# Patient Record
Sex: Male | Born: 1965 | Race: White | Hispanic: No | Smoking: Never smoker
Health system: Southern US, Community
[De-identification: ages and names within clinical notes are randomized; demographics above are authoritative.]

## PROBLEM LIST (undated history)

## (undated) DIAGNOSIS — I10 Essential (primary) hypertension: Secondary | ICD-10-CM

## (undated) DIAGNOSIS — Z5189 Encounter for other specified aftercare: Secondary | ICD-10-CM

## (undated) DIAGNOSIS — K589 Irritable bowel syndrome without diarrhea: Secondary | ICD-10-CM

## (undated) DIAGNOSIS — K2970 Gastritis, unspecified, without bleeding: Secondary | ICD-10-CM

## (undated) DIAGNOSIS — K5792 Diverticulitis of intestine, part unspecified, without perforation or abscess without bleeding: Secondary | ICD-10-CM

## (undated) DIAGNOSIS — T7840XA Allergy, unspecified, initial encounter: Secondary | ICD-10-CM

## (undated) DIAGNOSIS — K5730 Diverticulosis of large intestine without perforation or abscess without bleeding: Secondary | ICD-10-CM

## (undated) DIAGNOSIS — J069 Acute upper respiratory infection, unspecified: Secondary | ICD-10-CM

## (undated) DIAGNOSIS — Z9889 Other specified postprocedural states: Secondary | ICD-10-CM

## (undated) DIAGNOSIS — F329 Major depressive disorder, single episode, unspecified: Secondary | ICD-10-CM

## (undated) DIAGNOSIS — F411 Generalized anxiety disorder: Secondary | ICD-10-CM

## (undated) DIAGNOSIS — K279 Peptic ulcer, site unspecified, unspecified as acute or chronic, without hemorrhage or perforation: Secondary | ICD-10-CM

## (undated) DIAGNOSIS — D649 Anemia, unspecified: Secondary | ICD-10-CM

## (undated) DIAGNOSIS — K469 Unspecified abdominal hernia without obstruction or gangrene: Secondary | ICD-10-CM

## (undated) DIAGNOSIS — J189 Pneumonia, unspecified organism: Secondary | ICD-10-CM

## (undated) DIAGNOSIS — F32A Depression, unspecified: Secondary | ICD-10-CM

## (undated) DIAGNOSIS — R112 Nausea with vomiting, unspecified: Secondary | ICD-10-CM

## (undated) DIAGNOSIS — K219 Gastro-esophageal reflux disease without esophagitis: Secondary | ICD-10-CM

## (undated) DIAGNOSIS — K2990 Gastroduodenitis, unspecified, without bleeding: Secondary | ICD-10-CM

## (undated) DIAGNOSIS — M459 Ankylosing spondylitis of unspecified sites in spine: Secondary | ICD-10-CM

## (undated) DIAGNOSIS — K449 Diaphragmatic hernia without obstruction or gangrene: Secondary | ICD-10-CM

## (undated) DIAGNOSIS — N289 Disorder of kidney and ureter, unspecified: Secondary | ICD-10-CM

## (undated) DIAGNOSIS — E785 Hyperlipidemia, unspecified: Secondary | ICD-10-CM

## (undated) DIAGNOSIS — Z800 Family history of malignant neoplasm of digestive organs: Secondary | ICD-10-CM

## (undated) DIAGNOSIS — M1990 Unspecified osteoarthritis, unspecified site: Secondary | ICD-10-CM

## (undated) HISTORY — PX: COLON SURGERY: SHX602

## (undated) HISTORY — DX: Diaphragmatic hernia without obstruction or gangrene: K44.9

## (undated) HISTORY — DX: Encounter for other specified aftercare: Z51.89

## (undated) HISTORY — DX: Gastritis, unspecified, without bleeding: K29.70

## (undated) HISTORY — DX: Depression, unspecified: F32.A

## (undated) HISTORY — DX: Diverticulosis of large intestine without perforation or abscess without bleeding: K57.30

## (undated) HISTORY — PX: BACK SURGERY: SHX140

## (undated) HISTORY — DX: Generalized anxiety disorder: F41.1

## (undated) HISTORY — DX: Hyperlipidemia, unspecified: E78.5

## (undated) HISTORY — DX: Allergy, unspecified, initial encounter: T78.40XA

## (undated) HISTORY — PX: SPINE SURGERY: SHX786

## (undated) HISTORY — DX: Essential (primary) hypertension: I10

## (undated) HISTORY — DX: Irritable bowel syndrome, unspecified: K58.9

## (undated) HISTORY — DX: Major depressive disorder, single episode, unspecified: F32.9

## (undated) HISTORY — DX: Gastroduodenitis, unspecified, without bleeding: K29.90

## (undated) HISTORY — DX: Diverticulitis of intestine, part unspecified, without perforation or abscess without bleeding: K57.92

## (undated) HISTORY — DX: Family history of malignant neoplasm of digestive organs: Z80.0

## (undated) HISTORY — DX: Unspecified abdominal hernia without obstruction or gangrene: K46.9

## (undated) HISTORY — DX: Gastro-esophageal reflux disease without esophagitis: K21.9

## (undated) HISTORY — DX: Anemia, unspecified: D64.9

## (undated) HISTORY — DX: Unspecified osteoarthritis, unspecified site: M19.90

## (undated) HISTORY — DX: Peptic ulcer, site unspecified, unspecified as acute or chronic, without hemorrhage or perforation: K27.9

---

## 1998-01-28 ENCOUNTER — Inpatient Hospital Stay (HOSPITAL_COMMUNITY): Admission: EM | Admit: 1998-01-28 | Discharge: 1998-02-01 | Payer: Self-pay | Admitting: Emergency Medicine

## 1998-09-30 HISTORY — PX: APPENDECTOMY: SHX54

## 1999-01-20 ENCOUNTER — Encounter: Payer: Self-pay | Admitting: Emergency Medicine

## 1999-01-20 ENCOUNTER — Emergency Department (HOSPITAL_COMMUNITY): Admission: EM | Admit: 1999-01-20 | Discharge: 1999-01-20 | Payer: Self-pay | Admitting: Emergency Medicine

## 1999-08-12 ENCOUNTER — Encounter: Payer: Self-pay | Admitting: Rheumatology

## 1999-08-12 ENCOUNTER — Ambulatory Visit (HOSPITAL_COMMUNITY): Admission: RE | Admit: 1999-08-12 | Discharge: 1999-08-12 | Payer: Self-pay | Admitting: Rheumatology

## 1999-09-20 ENCOUNTER — Encounter: Payer: Self-pay | Admitting: Neurosurgery

## 1999-09-20 ENCOUNTER — Observation Stay (HOSPITAL_COMMUNITY): Admission: RE | Admit: 1999-09-20 | Discharge: 1999-09-20 | Payer: Self-pay | Admitting: Neurosurgery

## 1999-11-28 ENCOUNTER — Ambulatory Visit (HOSPITAL_COMMUNITY): Admission: RE | Admit: 1999-11-28 | Discharge: 1999-11-28 | Payer: Self-pay | Admitting: Neurosurgery

## 1999-11-28 ENCOUNTER — Encounter: Payer: Self-pay | Admitting: Neurosurgery

## 2000-03-19 ENCOUNTER — Encounter: Admission: RE | Admit: 2000-03-19 | Discharge: 2000-06-17 | Payer: Self-pay | Admitting: Anesthesiology

## 2000-04-15 ENCOUNTER — Ambulatory Visit (HOSPITAL_COMMUNITY): Admission: RE | Admit: 2000-04-15 | Discharge: 2000-04-15 | Payer: Self-pay | Admitting: Endocrinology

## 2000-04-15 ENCOUNTER — Encounter: Payer: Self-pay | Admitting: Endocrinology

## 2000-05-19 ENCOUNTER — Encounter: Payer: Self-pay | Admitting: Neurosurgery

## 2000-05-19 ENCOUNTER — Ambulatory Visit (HOSPITAL_COMMUNITY): Admission: RE | Admit: 2000-05-19 | Discharge: 2000-05-19 | Payer: Self-pay | Admitting: Neurosurgery

## 2000-06-09 ENCOUNTER — Inpatient Hospital Stay (HOSPITAL_COMMUNITY): Admission: RE | Admit: 2000-06-09 | Discharge: 2000-06-11 | Payer: Self-pay | Admitting: Neurosurgery

## 2000-06-09 ENCOUNTER — Encounter: Payer: Self-pay | Admitting: Neurosurgery

## 2000-07-15 ENCOUNTER — Ambulatory Visit (HOSPITAL_COMMUNITY): Admission: RE | Admit: 2000-07-15 | Discharge: 2000-07-15 | Payer: Self-pay | Admitting: Neurosurgery

## 2000-07-15 ENCOUNTER — Encounter: Payer: Self-pay | Admitting: Neurosurgery

## 2000-09-03 ENCOUNTER — Ambulatory Visit (HOSPITAL_COMMUNITY): Admission: RE | Admit: 2000-09-03 | Discharge: 2000-09-03 | Payer: Self-pay | Admitting: Neurosurgery

## 2000-09-03 ENCOUNTER — Encounter: Payer: Self-pay | Admitting: Neurosurgery

## 2001-01-22 ENCOUNTER — Encounter (INDEPENDENT_AMBULATORY_CARE_PROVIDER_SITE_OTHER): Payer: Self-pay | Admitting: Gastroenterology

## 2001-01-22 DIAGNOSIS — K2970 Gastritis, unspecified, without bleeding: Secondary | ICD-10-CM | POA: Insufficient documentation

## 2001-01-22 DIAGNOSIS — K2990 Gastroduodenitis, unspecified, without bleeding: Secondary | ICD-10-CM

## 2001-01-22 DIAGNOSIS — K449 Diaphragmatic hernia without obstruction or gangrene: Secondary | ICD-10-CM | POA: Insufficient documentation

## 2001-01-23 ENCOUNTER — Encounter (INDEPENDENT_AMBULATORY_CARE_PROVIDER_SITE_OTHER): Payer: Self-pay | Admitting: Specialist

## 2001-01-23 ENCOUNTER — Other Ambulatory Visit: Admission: RE | Admit: 2001-01-23 | Discharge: 2001-01-23 | Payer: Self-pay | Admitting: Gastroenterology

## 2001-05-18 ENCOUNTER — Emergency Department (HOSPITAL_COMMUNITY): Admission: EM | Admit: 2001-05-18 | Discharge: 2001-05-18 | Payer: Self-pay | Admitting: Emergency Medicine

## 2001-05-18 ENCOUNTER — Encounter: Payer: Self-pay | Admitting: Emergency Medicine

## 2001-06-06 ENCOUNTER — Encounter: Payer: Self-pay | Admitting: Neurosurgery

## 2001-06-06 ENCOUNTER — Ambulatory Visit (HOSPITAL_COMMUNITY): Admission: RE | Admit: 2001-06-06 | Discharge: 2001-06-06 | Payer: Self-pay | Admitting: Neurosurgery

## 2003-04-26 ENCOUNTER — Encounter: Admission: RE | Admit: 2003-04-26 | Discharge: 2003-04-26 | Payer: Self-pay | Admitting: Internal Medicine

## 2003-06-14 ENCOUNTER — Encounter: Payer: Self-pay | Admitting: Orthopaedic Surgery

## 2003-06-16 ENCOUNTER — Ambulatory Visit (HOSPITAL_COMMUNITY): Admission: RE | Admit: 2003-06-16 | Discharge: 2003-06-17 | Payer: Self-pay | Admitting: Orthopaedic Surgery

## 2003-06-16 ENCOUNTER — Encounter: Payer: Self-pay | Admitting: Orthopaedic Surgery

## 2004-02-21 ENCOUNTER — Encounter: Admission: RE | Admit: 2004-02-21 | Discharge: 2004-02-21 | Payer: Self-pay | Admitting: Orthopaedic Surgery

## 2004-04-18 ENCOUNTER — Inpatient Hospital Stay (HOSPITAL_COMMUNITY): Admission: RE | Admit: 2004-04-18 | Discharge: 2004-04-21 | Payer: Self-pay | Admitting: Orthopaedic Surgery

## 2004-06-22 ENCOUNTER — Emergency Department (HOSPITAL_COMMUNITY): Admission: EM | Admit: 2004-06-22 | Discharge: 2004-06-23 | Payer: Self-pay | Admitting: Emergency Medicine

## 2004-09-20 ENCOUNTER — Encounter: Admission: RE | Admit: 2004-09-20 | Discharge: 2004-09-20 | Payer: Self-pay | Admitting: Orthopaedic Surgery

## 2005-09-30 HISTORY — PX: CERVICAL FUSION: SHX112

## 2007-02-04 ENCOUNTER — Ambulatory Visit: Payer: Self-pay | Admitting: Gastroenterology

## 2007-04-26 ENCOUNTER — Encounter: Admission: RE | Admit: 2007-04-26 | Discharge: 2007-04-26 | Payer: Self-pay | Admitting: Orthopaedic Surgery

## 2007-04-28 ENCOUNTER — Encounter: Admission: RE | Admit: 2007-04-28 | Discharge: 2007-04-28 | Payer: Self-pay | Admitting: Orthopaedic Surgery

## 2007-06-11 ENCOUNTER — Ambulatory Visit: Payer: Self-pay | Admitting: Family Medicine

## 2007-06-11 ENCOUNTER — Inpatient Hospital Stay (HOSPITAL_COMMUNITY): Admission: AD | Admit: 2007-06-11 | Discharge: 2007-06-16 | Payer: Self-pay | Admitting: Family Medicine

## 2007-06-23 ENCOUNTER — Ambulatory Visit: Payer: Self-pay | Admitting: Gastroenterology

## 2007-06-23 LAB — CONVERTED CEMR LAB
ALT: 31 units/L (ref 0–53)
AST: 33 units/L (ref 0–37)
Albumin: 4.1 g/dL (ref 3.5–5.2)
Alkaline Phosphatase: 78 units/L (ref 39–117)
BUN: 22 mg/dL (ref 6–23)
Basophils Absolute: 0 10*3/uL (ref 0.0–0.1)
Basophils Relative: 0.4 % (ref 0.0–1.0)
Bilirubin, Direct: 0.1 mg/dL (ref 0.0–0.3)
CO2: 30 meq/L (ref 19–32)
Calcium: 9.5 mg/dL (ref 8.4–10.5)
Chloride: 110 meq/L (ref 96–112)
Creatinine, Ser: 1.2 mg/dL (ref 0.4–1.5)
Eosinophils Absolute: 0.1 10*3/uL (ref 0.0–0.6)
Eosinophils Relative: 1.7 % (ref 0.0–5.0)
Ferritin: 62.5 ng/mL (ref 22.0–322.0)
Folate: 6.5 ng/mL
GFR calc Af Amer: 86 mL/min
GFR calc non Af Amer: 71 mL/min
Glucose, Bld: 95 mg/dL (ref 70–99)
HCT: 38.4 % — ABNORMAL LOW (ref 39.0–52.0)
Hemoglobin: 13.5 g/dL (ref 13.0–17.0)
Iron: 107 ug/dL (ref 42–165)
Lymphocytes Relative: 28.2 % (ref 12.0–46.0)
MCHC: 35.2 g/dL (ref 30.0–36.0)
MCV: 85.3 fL (ref 78.0–100.0)
Monocytes Absolute: 0.9 10*3/uL — ABNORMAL HIGH (ref 0.2–0.7)
Monocytes Relative: 10.8 % (ref 3.0–11.0)
Neutro Abs: 5.2 10*3/uL (ref 1.4–7.7)
Neutrophils Relative %: 58.9 % (ref 43.0–77.0)
Platelets: 308 10*3/uL (ref 150–400)
Potassium: 4.2 meq/L (ref 3.5–5.1)
RBC: 4.5 M/uL (ref 4.22–5.81)
RDW: 12.3 % (ref 11.5–14.6)
Saturation Ratios: 31.3 % (ref 20.0–50.0)
Sed Rate: 22 mm/hr — ABNORMAL HIGH (ref 0–20)
Sodium: 146 meq/L — ABNORMAL HIGH (ref 135–145)
TSH: 1.04 microintl units/mL (ref 0.35–5.50)
Tissue Transglutaminase Ab, IgA: 0.4 units (ref <7)
Total Bilirubin: 0.6 mg/dL (ref 0.3–1.2)
Total Protein: 7.2 g/dL (ref 6.0–8.3)
Transferrin: 244.4 mg/dL (ref >=212.0)
Vitamin B-12: 375 pg/mL (ref 211–911)
WBC: 8.6 10*3/uL (ref 4.5–10.5)

## 2007-07-06 ENCOUNTER — Ambulatory Visit: Payer: Self-pay | Admitting: Gastroenterology

## 2007-07-06 ENCOUNTER — Encounter: Payer: Self-pay | Admitting: Gastroenterology

## 2007-07-06 DIAGNOSIS — K5730 Diverticulosis of large intestine without perforation or abscess without bleeding: Secondary | ICD-10-CM | POA: Insufficient documentation

## 2007-07-21 ENCOUNTER — Ambulatory Visit: Payer: Self-pay | Admitting: Cardiology

## 2007-08-10 ENCOUNTER — Ambulatory Visit: Payer: Self-pay

## 2007-08-10 ENCOUNTER — Encounter: Payer: Self-pay | Admitting: Cardiology

## 2007-08-10 ENCOUNTER — Ambulatory Visit: Payer: Self-pay | Admitting: Gastroenterology

## 2007-09-01 ENCOUNTER — Ambulatory Visit: Payer: Self-pay | Admitting: Cardiology

## 2007-11-20 DIAGNOSIS — M549 Dorsalgia, unspecified: Secondary | ICD-10-CM | POA: Insufficient documentation

## 2007-11-20 DIAGNOSIS — R519 Headache, unspecified: Secondary | ICD-10-CM | POA: Insufficient documentation

## 2007-11-20 DIAGNOSIS — K219 Gastro-esophageal reflux disease without esophagitis: Secondary | ICD-10-CM

## 2007-11-20 DIAGNOSIS — I10 Essential (primary) hypertension: Secondary | ICD-10-CM | POA: Insufficient documentation

## 2007-11-20 DIAGNOSIS — K589 Irritable bowel syndrome without diarrhea: Secondary | ICD-10-CM

## 2007-11-20 DIAGNOSIS — F411 Generalized anxiety disorder: Secondary | ICD-10-CM | POA: Insufficient documentation

## 2007-11-20 DIAGNOSIS — E785 Hyperlipidemia, unspecified: Secondary | ICD-10-CM

## 2007-11-20 DIAGNOSIS — R002 Palpitations: Secondary | ICD-10-CM

## 2007-11-20 DIAGNOSIS — R51 Headache: Secondary | ICD-10-CM

## 2008-07-22 ENCOUNTER — Ambulatory Visit: Payer: Self-pay | Admitting: Gastroenterology

## 2008-07-22 DIAGNOSIS — K5900 Constipation, unspecified: Secondary | ICD-10-CM | POA: Insufficient documentation

## 2008-07-22 DIAGNOSIS — K625 Hemorrhage of anus and rectum: Secondary | ICD-10-CM | POA: Insufficient documentation

## 2008-07-22 DIAGNOSIS — R109 Unspecified abdominal pain: Secondary | ICD-10-CM | POA: Insufficient documentation

## 2008-07-22 LAB — CONVERTED CEMR LAB
Eosinophils Relative: 2.3 % (ref 0.0–5.0)
Magnesium: 2.3 mg/dL (ref 1.5–2.5)
Monocytes Relative: 9.2 % (ref 3.0–12.0)
Neutrophils Relative %: 55.5 % (ref 43.0–77.0)
Platelets: 221 10*3/uL (ref 150–400)
Saturation Ratios: 13.3 % — ABNORMAL LOW (ref 20.0–50.0)
Sed Rate: 11 mm/hr (ref 0–16)
TSH: 0.82 microintl units/mL (ref 0.35–5.50)
Transferrin: 300.3 mg/dL (ref >=212.0)
WBC: 6.4 10*3/uL (ref 4.5–10.5)

## 2008-08-18 ENCOUNTER — Ambulatory Visit: Payer: Self-pay | Admitting: Gastroenterology

## 2010-11-29 DIAGNOSIS — IMO0001 Reserved for inherently not codable concepts without codable children: Secondary | ICD-10-CM

## 2010-11-29 DIAGNOSIS — Z5189 Encounter for other specified aftercare: Secondary | ICD-10-CM

## 2010-11-29 HISTORY — DX: Reserved for inherently not codable concepts without codable children: IMO0001

## 2010-11-29 HISTORY — PX: STOMACH SURGERY: SHX791

## 2010-11-29 HISTORY — DX: Encounter for other specified aftercare: Z51.89

## 2010-12-08 ENCOUNTER — Inpatient Hospital Stay (HOSPITAL_COMMUNITY)
Admission: EM | Admit: 2010-12-08 | Discharge: 2010-12-12 | DRG: 378 | Disposition: A | Discharge status: Home / Self Care | Payer: Managed Care, Other (non HMO) | Attending: Internal Medicine | Admitting: Internal Medicine

## 2010-12-08 ENCOUNTER — Emergency Department (HOSPITAL_COMMUNITY): Payer: Managed Care, Other (non HMO)

## 2010-12-08 ENCOUNTER — Other Ambulatory Visit: Payer: Self-pay | Admitting: Emergency Medicine

## 2010-12-08 DIAGNOSIS — E785 Hyperlipidemia, unspecified: Secondary | ICD-10-CM | POA: Diagnosis present

## 2010-12-08 DIAGNOSIS — K449 Diaphragmatic hernia without obstruction or gangrene: Secondary | ICD-10-CM | POA: Diagnosis present

## 2010-12-08 DIAGNOSIS — K264 Chronic or unspecified duodenal ulcer with hemorrhage: Principal | ICD-10-CM | POA: Diagnosis present

## 2010-12-08 DIAGNOSIS — R55 Syncope and collapse: Secondary | ICD-10-CM | POA: Diagnosis not present

## 2010-12-08 DIAGNOSIS — K2970 Gastritis, unspecified, without bleeding: Secondary | ICD-10-CM | POA: Diagnosis present

## 2010-12-08 DIAGNOSIS — K5730 Diverticulosis of large intestine without perforation or abscess without bleeding: Secondary | ICD-10-CM | POA: Diagnosis present

## 2010-12-08 DIAGNOSIS — Z981 Arthrodesis status: Secondary | ICD-10-CM

## 2010-12-08 DIAGNOSIS — K2990 Gastroduodenitis, unspecified, without bleeding: Secondary | ICD-10-CM | POA: Diagnosis present

## 2010-12-08 DIAGNOSIS — D62 Acute posthemorrhagic anemia: Secondary | ICD-10-CM | POA: Diagnosis present

## 2010-12-08 DIAGNOSIS — D649 Anemia, unspecified: Secondary | ICD-10-CM

## 2010-12-08 DIAGNOSIS — K2980 Duodenitis without bleeding: Secondary | ICD-10-CM | POA: Diagnosis present

## 2010-12-08 LAB — COMPREHENSIVE METABOLIC PANEL WITH GFR
ALT: 15 U/L (ref 0–53)
BUN: 26 mg/dL — ABNORMAL HIGH (ref 6–23)
Calcium: 9.1 mg/dL (ref 8.4–10.5)
Creatinine, Ser: 1.2 mg/dL (ref 0.4–1.5)
Glucose, Bld: 92 mg/dL (ref 70–99)
Sodium: 140 meq/L (ref 135–145)
Total Protein: 6.4 g/dL (ref 6.0–8.3)

## 2010-12-08 LAB — CBC
HCT: 32.5 % — ABNORMAL LOW (ref 39.0–52.0)
Hemoglobin: 11.1 g/dL — ABNORMAL LOW (ref 13.0–17.0)
MCH: 29.2 pg (ref 26.0–34.0)
MCHC: 34.2 g/dL (ref 30.0–36.0)
MCV: 85.5 fL (ref 78.0–100.0)
Platelets: 213 K/uL (ref 150–400)
RBC: 3.8 MIL/uL — ABNORMAL LOW (ref 4.22–5.81)
RDW: 12.6 % (ref 11.5–15.5)
WBC: 13.8 K/uL — ABNORMAL HIGH (ref 4.0–10.5)

## 2010-12-08 LAB — DIFFERENTIAL
Basophils Absolute: 0.1 10*3/uL (ref 0.0–0.1)
Basophils Relative: 0 % (ref 0–1)
Eosinophils Absolute: 0 K/uL (ref 0.0–0.7)
Eosinophils Relative: 0 % (ref 0–5)
Lymphocytes Relative: 26 % (ref 12–46)
Lymphs Abs: 3.5 K/uL (ref 0.7–4.0)
Monocytes Absolute: 1.2 10*3/uL — ABNORMAL HIGH (ref 0.1–1.0)
Monocytes Relative: 9 % (ref 3–12)
Neutro Abs: 9 10*3/uL — ABNORMAL HIGH (ref 1.7–7.7)
Neutrophils Relative %: 65 % (ref 43–77)

## 2010-12-08 LAB — HEMOGLOBIN AND HEMATOCRIT, BLOOD
HCT: 28.4 % — ABNORMAL LOW (ref 39.0–52.0)
Hemoglobin: 9.8 g/dL — ABNORMAL LOW (ref 13.0–17.0)

## 2010-12-08 LAB — COMPREHENSIVE METABOLIC PANEL
AST: 23 U/L (ref 0–37)
Albumin: 3.8 g/dL (ref 3.5–5.2)
Alkaline Phosphatase: 53 U/L (ref 39–117)
CO2: 24 mEq/L (ref 19–32)
Chloride: 108 mEq/L (ref 96–112)
GFR calc Af Amer: >60 mL/min (ref >60)
GFR calc non Af Amer: >60 mL/min (ref >60)
Potassium: 3.5 mEq/L (ref 3.5–5.1)
Total Bilirubin: 0.4 mg/dL (ref 0.3–1.2)

## 2010-12-08 LAB — ABO/RH: ABO/RH(D): O NEG

## 2010-12-08 LAB — APTT: aPTT: 31 seconds (ref 24–37)

## 2010-12-08 LAB — PREPARE RBC (CROSSMATCH)

## 2010-12-08 LAB — LIPASE, BLOOD: Lipase: 45 U/L (ref 11–59)

## 2010-12-08 MED ORDER — IOHEXOL 300 MG/ML  SOLN
125.0000 mL | Freq: Once | INTRAMUSCULAR | Status: AC | PRN
  Administered 2010-12-08: 125 mL via INTRAVENOUS

## 2010-12-09 ENCOUNTER — Other Ambulatory Visit: Payer: Self-pay | Admitting: Internal Medicine

## 2010-12-09 DIAGNOSIS — K921 Melena: Secondary | ICD-10-CM

## 2010-12-09 DIAGNOSIS — K264 Chronic or unspecified duodenal ulcer with hemorrhage: Secondary | ICD-10-CM

## 2010-12-09 DIAGNOSIS — D62 Acute posthemorrhagic anemia: Secondary | ICD-10-CM

## 2010-12-09 DIAGNOSIS — K2980 Duodenitis without bleeding: Secondary | ICD-10-CM

## 2010-12-09 LAB — BASIC METABOLIC PANEL
Calcium: 8.4 mg/dL (ref 8.4–10.5)
Creatinine, Ser: 1.29 mg/dL (ref 0.4–1.5)
GFR calc Af Amer: >60 mL/min (ref >60)
GFR calc non Af Amer: >60 mL/min (ref >60)
Sodium: 138 mEq/L (ref 135–145)

## 2010-12-09 LAB — HEMOGLOBIN AND HEMATOCRIT, BLOOD
HCT: 26.7 % — ABNORMAL LOW (ref 39.0–52.0)
HCT: 25.8 % — ABNORMAL LOW (ref 39.0–52.0)
HCT: 25.2 % — ABNORMAL LOW (ref 39.0–52.0)
Hemoglobin: 9.1 g/dL — ABNORMAL LOW (ref 13.0–17.0)
Hemoglobin: 8.5 g/dL — ABNORMAL LOW (ref 13.0–17.0)

## 2010-12-09 LAB — CBC
MCH: 29.4 pg (ref 26.0–34.0)
Platelets: 184 10*3/uL (ref 150–400)
RBC: 3.16 MIL/uL — ABNORMAL LOW (ref 4.22–5.81)
WBC: 6.7 10*3/uL (ref 4.0–10.5)

## 2010-12-10 DIAGNOSIS — D62 Acute posthemorrhagic anemia: Secondary | ICD-10-CM

## 2010-12-10 DIAGNOSIS — K2981 Duodenitis with bleeding: Secondary | ICD-10-CM

## 2010-12-10 DIAGNOSIS — R1013 Epigastric pain: Secondary | ICD-10-CM

## 2010-12-10 LAB — CBC
HCT: 24.6 % — ABNORMAL LOW (ref 39.0–52.0)
HCT: 31.3 % — ABNORMAL LOW (ref 39.0–52.0)
Hemoglobin: 10.2 g/dL — ABNORMAL LOW (ref 13.0–17.0)
MCH: 28.3 pg (ref 26.0–34.0)
MCHC: 33.3 g/dL (ref 30.0–36.0)
MCHC: 32.6 g/dL (ref 30.0–36.0)
Platelets: 184 10*3/uL (ref 150–400)
RDW: 12.9 % (ref 11.5–15.5)

## 2010-12-10 LAB — RENAL FUNCTION PANEL
Albumin: 3.2 g/dL — ABNORMAL LOW (ref 3.5–5.2)
BUN: 7 mg/dL (ref 6–23)
CO2: 26 mEq/L (ref 19–32)
Chloride: 108 mEq/L (ref 96–112)
Creatinine, Ser: 1.15 mg/dL (ref 0.4–1.5)

## 2010-12-10 LAB — HEMOGLOBIN AND HEMATOCRIT, BLOOD: HCT: 26 % — ABNORMAL LOW (ref 39.0–52.0)

## 2010-12-11 DIAGNOSIS — K2981 Duodenitis with bleeding: Secondary | ICD-10-CM

## 2010-12-11 DIAGNOSIS — R1013 Epigastric pain: Secondary | ICD-10-CM

## 2010-12-11 LAB — TYPE AND SCREEN
ABO/RH(D): O NEG
Antibody Screen: NEGATIVE
Unit division: 0
Unit division: 0

## 2010-12-11 LAB — CBC
MCH: 29.3 pg (ref 26.0–34.0)
MCV: 87.9 fL (ref 78.0–100.0)
Platelets: 182 10*3/uL (ref 150–400)
RDW: 13.1 % (ref 11.5–15.5)

## 2010-12-11 LAB — BASIC METABOLIC PANEL
BUN: 8 mg/dL (ref 6–23)
Creatinine, Ser: 1.26 mg/dL (ref 0.4–1.5)
GFR calc non Af Amer: >60 mL/min (ref >60)

## 2010-12-11 LAB — HEMOGLOBIN AND HEMATOCRIT, BLOOD: Hemoglobin: 11 g/dL — ABNORMAL LOW (ref 13.0–17.0)

## 2010-12-12 ENCOUNTER — Inpatient Hospital Stay (HOSPITAL_COMMUNITY): Payer: Managed Care, Other (non HMO)

## 2010-12-12 DIAGNOSIS — D62 Acute posthemorrhagic anemia: Secondary | ICD-10-CM

## 2010-12-12 DIAGNOSIS — R1013 Epigastric pain: Secondary | ICD-10-CM

## 2010-12-12 LAB — CBC
HCT: 32.1 % — ABNORMAL LOW (ref 39.0–52.0)
MCHC: 32.7 g/dL (ref 30.0–36.0)
MCV: 87.9 fL (ref 78.0–100.0)
RDW: 13.1 % (ref 11.5–15.5)

## 2010-12-17 ENCOUNTER — Telehealth: Payer: Self-pay | Admitting: Internal Medicine

## 2010-12-18 NOTE — H&P (Signed)
NAME:  CORDARYL, DECELLES                ACCOUNT NO.:  [id number]  MEDICAL RECORD NO.:  [id number]           PATIENT TYPE:  E  LOCATION:  WLED                         FACILITY:  [facility]  PHYSICIAN:  Marcellus Scott, MD     DATE OF BIRTH:  [birth year]  DATE OF ADMISSION:  12/08/2010 DATE OF DISCHARGE:                             HISTORY & PHYSICAL   PRIMARY CARE PHYSICIAN:  Shade Flood, MD, [facility].  GASTROENTEROLOGIST:  Dr. Ivery Quale.  CARDIOLOGIST:  Madolyn Frieze. Jens Som, MD, [facility].  CHIEF COMPLAINTS:  Nausea, vomiting and black tarry stools.  HISTORY OF PRESENT ILLNESS:  Jason [name] is a pleasant 45 year old Caucasian male patient with history of hypertension, not on medications; hyperlipidemia; diverticulosis, who indicates that for approximately 2 years he has had epigastric and right upper quadrant intermittent abdominal pain.  This pain comes on approximately half an hour to 1 hour after food consumption.  It varies between burning to sharp pain with some radiation to the back.  He tries to belch, sometimes goes down on his knees and at times has used Nexium and Tums with no significant relief.  For the last 5 days, he has noted black tarry stools 3 times per day.  He, however, has not brought this to anyone's attention.  The stools apparently got significantly darker this morning.  There is no bright red blood in the stool.  He also complains of some occasional dizziness until yesterday.  Yesterday afternoon, he consumed a homemade sandwich and within half an hour after eating that sandwich, he started complaining of belching eventually followed by 3-4 episodes of vomiting and nausea.  The vomitus consisted of clear liquids and no coffee grounds or blood.  This lasted until 7:30 p.m. when he was able to lay down.  At approximately 10 p.m., he woke up to go to the bathroom. While sitting on the commode, he experienced hot flush tingling all over the  body, slight dizziness and then passed out and woke up on the floor a couple of minutes later.  He said he did not hurt himself anywhere. He then went and laid down and eventually went to sleep at 2 a.m. Again, sometime during the course of the night, he had another episode of feeling hot and sweaty and tingly all over, but did not pass out. This morning he had another couple of episodes of vomiting, but no coffee grounds or blood.  He also had a black tarry stool.  He has complained of some right upper quadrant and epigastric pain similar to the prior pains that he has had.  He went to the [facility] where his stool was found to be Hemoccult positive and the patient was asked to come to the emergency room for further evaluation and  management.  The patient denies history of NSAID use.  PAST MEDICAL HISTORY: 1. Hypertension. 2. Hyperlipidemia. 3. History of rapid heartbeat. 4. Bronchitis. 5. Diverticulosis. 6. Hiatal hernia.  PAST SURGICAL HISTORY: 1. Appendectomy. 2. Umbilical hernia repair. 3. Spinal fusion x3. 4. Cervical spine fusion. 5. EGD and colonoscopy in 2008 apparently showed  only diverticulosis.  ALLERGIES:  VICODIN apparently causes fast heart rate and palpitations.  MEDICATIONS:  The patient is not on any prescription medications.  He takes vitamin B12 one p.o. daily and Tums p.r.n.  FAMILY HISTORY:  The patient's mother had CVA at age 66 and age 74.  She also had 2 artificial heart valves.  The patient's father died at age 626 from a myocardial infarction.  The patient's brother died at age 98 from acute myeloid leukemia and the patient's sister died at infancy at age 62 months from a leaking valve.  SOCIAL HISTORY:  The patient lives with his domestic partner.  He works at [employer] as a Nurse, children's.  He is independent of activities of daily living.  There is no history of smoking, alcohol or drug abuse.  ADVANCE DIRECTIVES:  Full code.  REVIEW  OF SYSTEMS:  All systems reviewed and apart from history of presenting illness is negative.  The patient denies any sickly contacts with similar symptoms.  PHYSICAL EXAMINATION:  GENERAL:  [name] is moderately built and nourished, in no obvious distress. VITAL SIGNS:  Temperature 98.1 degrees Fahrenheit, blood pressure 127/65 mmHg lying, pulse 102 per minute and regular, respirations 16 per minute, saturating at 96% on room air. HEENT:  Nontraumatic, normocephalic.  Pupils equally reacting to light and accommodation.  Oral mucosa is slightly dry, but no coffee grounds or blood. NECK:  Supple.  No JVD or carotid bruit. LYMPHATICS:  No lymphadenopathy. RESPIRATORY:  Clear to auscultation. CARDIOVASCULAR:  First and second heart sounds heard.  Regular rate and rhythm.  No murmurs, rubs, gallops or clicks. ABDOMEN:  Nondistended, soft.  Mild tenderness in the epigastric and right upper quadrant regions, but no rigidity, guarding or rebound.  No organomegaly or mass appreciated.  Normal bowel sounds heard. CENTRAL NERVOUS SYSTEM:  The patient is awake, alert, oriented x3 with no focal neurological deficits. EXTREMITIES:  With grade 5/5 power.  No cyanosis, clubbing or edema. Peripheral pulses symmetrically felt. SKIN:  Without any rashes. MUSCULOSKELETAL:  Negative.  LABORATORY DATA:  Lipase is 45.  Comprehensive metabolic panel shows BUN 26, creatinine 1.2, rest of it is within normal limits.  CBC: Hemoglobin 11.1, hematocrit 32.5, white blood cell 13.8, platelets 213. Hemoccult was positive when done at the [facility]gent [facility].  His hemoglobin in 2008 was 13.5.  IMAGING:  CT of the abdomen and pelvis with contrast.  Impression, small periumbilical hernia through which a small portion of the bowel transverses, but does not become obstructed.  Stomach and portion of the bowel underdistended.  No extraluminal bowel inflammatory process.  Basilar atelectasis versus scarring.  Mild  fatty infiltration of the liver.  ASSESSMENT AND PLAN: 1. Upper gastrointestinal bleeding, rule out peptic ulcer disease.  We     will admit the patient to telemetry.  Make the patient nil per oral     and place 2 wide bore IV accesses.  We will check coagulation     indices.  Monitor frequent hemoglobin and hematocrits.  We will     place on IV Protonix drip.  [facility] has been consulted.  I have     discussed his case with Dr. Lina Sar who may plan for an EGD in     the a.m. 2. Acute blood loss anemia.  Will follow hemoglobin and hematocrits     frequently and transfuse as needed. 3. Syncope secondary to blood loss anemia and dehydration.  The     patient  may have been orthostatic.  We will admit him to Telemetry,     hydrate with IV fluids and monitor closely. 4. History of hypertension.  The patient has been managed off     medications. 5. Fatty liver on CT of the abdomen.  TIME SPENT:  Time taken in coordinating this admission is an hour.     Marcellus Scott, MD     AH/MEDQ  D:  12/08/2010  T:  12/08/2010  Job:  [id number]  cc:   Shade Flood, M.D. Fax: [phone]  Madolyn Frieze. Jens Som, MD, [facility] [address]  [address] [place] [address]  Hedwig Morton. Juanda Chance, MD [address] [place] [address]  Dr. Ivery Quale  Electronically Signed by Marcellus Scott MD on 12/18/2010 01:23:03 AM

## 2010-12-18 NOTE — Procedures (Signed)
Summary: Upper Endoscopy  Patient: Jason [name] Note: All result statuses are Final unless otherwise noted.  Tests: (1) Upper Endoscopy (EGD)   EGD Upper Endoscopy       DONE     [facility]     [address] [place], [place]  [address]          ENDOSCOPY PROCEDURE REPORT          PATIENT:  Jason [name], Jason [name]  MR#:  [id number]     BIRTHDATE:  06-29-1966, 45 yrs. old  GENDER:  male          ENDOSCOPIST:  Hedwig Morton. Juanda Chance, MD     Referred by:  Vania Rea. Jarold Motto, M.D.          PROCEDURE DATE:  12/09/2010     PROCEDURE:  EGD for control of bleeding     ASA CLASS:  Class II     INDICATIONS:  melena, anemia, abdominal pain syncopal episode,     melenic stols x several days,,     last EGD 2002 showed gastritis     denies NSAID's, alcohol          MEDICATIONS:   Versed 7 mg, Fentanyl 75 mcg     TOPICAL ANESTHETIC:  Cetacaine Spray          DESCRIPTION OF PROCEDURE:   After the risks benefits and     alternatives of the procedure were thoroughly explained, informed     consent was obtained.  The  endoscope was introduced through the     mouth and advanced to the second portion of the duodenum, without     limitations.  The instrument was slowly withdrawn as the mucosa     was fully examined.     <<PROCEDUREIMAGES>>          An ulcer was found in the duodenal bulb portion of the duodenum     (see image3, image4, image5, image8, image11, and image10). 5 mm     shallow ulcer with oozing blood vessel epinephrine injection     endoscopic clips were placed 1 cc x3 of Epinephrine 1:10 000     solution injected into the 3 qaudrants of the ulcer, 2 Endo clips     applied  Duodenitis was found (see image9, image7, and image6).     Mild gastritis was found. With standard forceps, a biopsy was     obtained and sent to pathology. r/o H (see image2, image1, and     image13).Pylori    Retroflexed views revealed no abnormalities.     The scope was then withdrawn from the patient  and the procedure     completed.          COMPLICATIONS:  None          ENDOSCOPIC IMPRESSION:     1) Ulcer in the duodenal bulb duodenum     2) Duodenitis     3) Mild gastritis     bleeding small DU, s/p hemostasis with Epi x3cc and Endo clips     x2,     s/p Bx's to r/o H.Pylori     RECOMMENDATIONS:     1) Await biopsy results     see chart for plan of treatment          REPEAT EXAM:  In 0 year(s) for.          ______________________________     Hedwig Morton. Juanda Chance, MD  CC:          n.     eSIGNED:   Gay Moncivais M. Amariyana Heacox at 12/09/2010 08:31 AM          Sherron Flemings, [id number]  Note: An exclamation mark (!) indicates a result that was not dispersed into the flowsheet. Document Creation Date: 12/09/2010 8:31 AM _______________________________________________________________________  (1) Order result status: Final Collection or observation date-time: 12/09/2010 08:16 Requested date-time:  Receipt date-time:  Reported date-time:  Referring Physician:   Ordering Physician: Lina Sar [phone]) Specimen Source:  Source: Launa Grill Order Number: [id number] Lab site:

## 2010-12-25 NOTE — Discharge Summary (Signed)
NAME:  Jason [name], Jason [name]                ACCOUNT NO.:  [id number]  MEDICAL RECORD NO.:  [id number]           PATIENT TYPE:  I  LOCATION:  1431                         FACILITY:  [facility]  PHYSICIAN:  Hartley Barefoot, MD    DATE OF BIRTH:  14-Jul-1966  DATE OF ADMISSION:  12/08/2010 DATE OF DISCHARGE:  12/12/2010                              DISCHARGE SUMMARY   DISCHARGE DIAGNOSES: 1. Gastrointestinal bleed secondary to duodenal ulcer. 2. Duodenitis. 3. Mild gastritis.  OTHER PAST MEDICAL HISTORY: 1. History of hypertension. 2. Hyperlipidemia. 3. History of rapid heart beat. 4. Diverticulosis. 5. Hiatal hernia.  PAST SURGICAL HISTORY: 1. Appendectomy. 2. Umbilical hernia repair. 3. Spinal fusion x3. 4. Cervical spinal fusion.  DISCHARGE MEDICATIONS: 1. Protonix 40 mg p.o. b.i.d. 2. Acetaminophen 325 tablets 650 mg by mouth every 4 hours as needed     for pain. 3. TUMS 2-4 tablets by mouth as needed. 4. Vitamin B12 one tablet every morning.  DISPOSITION AND FOLLOWUP:  Jason [name] will need to follow up with Dr. Lina Sar to follow up biopsy results and H. pylori result.  He will need also to follow with his primary care physician for management all his other chronic medical problems.  STUDIES PERFORMED: 1. CT abdomen and pelvis show a small periumbilical hernia through     which a small portion of the bowel transfer, but does not become     obstructed.  Stomach and portion of the bowel were under distended.     No extraluminal bowel information process, bibasilar atelectasis,     basilar scarring, mild fatty infiltration of the liver. 2. Chest x-ray low lung volumes and the left base atelectasis. 3. X-rays:  An abdominal x-ray ordered on December 12, 2010 that is     pending at this time. 4. Endoscopy that showed ulcer in the duodenal bulb, duodenum,     duodenitis, mild gastritis.  Bleeding and small duodenal ulcer,     status post hemostasis with epi x3 mL and Endo Clips x2,  status     post biopsy to rule out H.  Pylori.  BRIEF HISTORY OF PRESENT ILLNESS:  This is a very pleasant 45 year old Caucasian male with history of hypertension, not on medication, who presents complaining of epigastric right upper quadrant pain.  For the last 3 days prior to admission, he has noted some black tarry stool.  The stool was getting significantly darker the morning of admission.  He had three to four episodes of vomiting and nausea of clear liquid, no coffee- ground or blood.  He had an Hemoccult positive.  Denies any history of NSAID use. 1. Upper GI bleed:  Patient was admitted to telemetry, recieved IV fluids,     cycle hemoglobin.  Dr. Juanda Chance was consulted.  He had an endoscopy     that showed duodenal ulcer.  He had hemostasis with epi x3 mL and     Endo Clips x2.  He had biopsy that is pending at this time.  He had     2 units of packed red blood cells.  His hemoglobin has remained  stable.  He will need to follow up with Dr. Lina Sar for result     of biopsy.  He will be discharged on pantoprazole 40 mg p.o. b.i.d. 2. Constipation:  Patient with some abdominal distention and     constipation.  He had a small bowel movement today.  KUB was     ordered to rule out obstruction.  If KUB is negative, patient will     have an enema and if he had a good bowel movement and if he is     feeling good, he will be discharged home today.  He was getting     Percocet and morphine.  His constipation was likely from opioids. 3. Hypertension:  Off of blood pressure medication.  His blood     pressure in the 130s range.  Continue with life-style modification. 4. On the day of discharge, patient was in improved condition and he     will be discharged later today if he is not having any abdominal     pain and has a bowel movement.  Blood pressure 130/80, sat is 94 on     room air, respirations 16, pulse 86, temperature 98.6.  Hemoglobin     11.1.  BMET on the day prior to of  discharge, sodium 139, potassium     4.2, chloride 108, bicarb 28, glucose 108, BUN 8, creatinine 1.26.     Hartley Barefoot, MD     BR/MEDQ  D:  12/12/2010  T:  12/12/2010  Job:  [id number]  Electronically Signed by Hartley Barefoot MD on 12/25/2010 01:17:06 PM

## 2010-12-27 NOTE — Progress Notes (Signed)
Summary: Return to work   Phone Note Call from Patient Call back at [employer] [phone] Call back at or [phone]   Call For: Dr Juanda Chance Reason for Call: Talk to Nurse Summary of Call: When is she supposed to go back to work? Was recently released from  hospital.  Initial call taken by: Leanor Kail [employer],  December 17, 2010 9:33 AM  Follow-up for Phone Call        Dr Juanda Chance, is patient supposed to go back to work now? Follow-up by: Lamona Curl CMA Duncan Dull),  December 17, 2010 12:32 PM  Additional Follow-up for Phone Call Additional follow up Details #1::        OK to return to work on [employer] 12/24/2010. Additional Follow-up by: Hart Carwin MD,  December 17, 2010 5:58 PM

## 2011-01-08 ENCOUNTER — Encounter: Payer: Self-pay | Admitting: Gastroenterology

## 2011-01-08 ENCOUNTER — Ambulatory Visit (INDEPENDENT_AMBULATORY_CARE_PROVIDER_SITE_OTHER): Payer: Managed Care, Other (non HMO) | Admitting: Gastroenterology

## 2011-01-08 ENCOUNTER — Other Ambulatory Visit: Payer: Self-pay | Admitting: Gastroenterology

## 2011-01-08 ENCOUNTER — Other Ambulatory Visit (INDEPENDENT_AMBULATORY_CARE_PROVIDER_SITE_OTHER): Payer: Managed Care, Other (non HMO)

## 2011-01-08 DIAGNOSIS — K921 Melena: Secondary | ICD-10-CM

## 2011-01-08 DIAGNOSIS — K269 Duodenal ulcer, unspecified as acute or chronic, without hemorrhage or perforation: Secondary | ICD-10-CM

## 2011-01-08 DIAGNOSIS — K219 Gastro-esophageal reflux disease without esophagitis: Secondary | ICD-10-CM

## 2011-01-08 DIAGNOSIS — R1013 Epigastric pain: Secondary | ICD-10-CM

## 2011-01-08 LAB — HEPATIC FUNCTION PANEL
ALT: 12 U/L (ref 0–53)
AST: 23 U/L (ref 0–37)
Albumin: 4.2 g/dL (ref 3.5–5.2)
Alkaline Phosphatase: 67 U/L (ref 39–117)
Total Protein: 7.4 g/dL (ref 6.0–8.3)

## 2011-01-08 LAB — CBC WITH DIFFERENTIAL/PLATELET
Basophils Absolute: 0.1 10*3/uL (ref 0.0–0.1)
Lymphocytes Relative: 33.6 % (ref 12.0–46.0)
Monocytes Relative: 11.1 % (ref 3.0–12.0)
Platelets: 209 10*3/uL (ref 150.0–400.0)
RDW: 13.1 % (ref 11.5–14.6)

## 2011-01-08 LAB — VITAMIN B12: Vitamin B-12: 482 pg/mL (ref 211–911)

## 2011-01-08 LAB — FERRITIN: Ferritin: 17.7 ng/mL — ABNORMAL LOW (ref 22.0–322.0)

## 2011-01-08 LAB — BASIC METABOLIC PANEL
BUN: 12 mg/dL (ref 6–23)
Chloride: 104 mEq/L (ref 96–112)
Glucose, Bld: 90 mg/dL (ref 70–99)
Potassium: 3.9 mEq/L (ref 3.5–5.1)

## 2011-01-08 LAB — IBC PANEL: Iron: 46 ug/dL (ref 42–165)

## 2011-01-08 MED ORDER — SUCRALFATE 1 GM/10ML PO SUSP: 1.0000 g | Freq: Four times a day (QID) | ORAL | Status: DC

## 2011-01-08 NOTE — Patient Instructions (Signed)
Your prescription(s) have been sent to you pharmacy.  Please go to the basement today for your labs.  Your procedure has been scheduled for 01/14/2011, please follow the seperate instructions.

## 2011-01-08 NOTE — Progress Notes (Signed)
History of Present Illness:  This is a 45 year old white male was hospitalized for upper gastrointestinal hemorrhage from a duodenal ulcer which required endoscopic intervention by Dr. Huston Foley. Biopsies for Helicobacter pylori were negative. He continues with mid abdominal pain 5 twice a day Protonix therapy. He repeatedly denies use of NSAIDs or aspirin. Also to [facility] hospitalization had a CT scan which showed small umbilical hernia. He apparently has an appointment to see [facility] [place] [facility].  Reviewing the patient's records as is crit chronic GERD and indigestion for many years. He also has a history of atypical chest pain with previous negative cardiac evaluation. His had multiple back surgeries,, anxiety syndrome, previous repair of an umbilical hernia, and known diverticulosis. Status post appendectomy. His colonoscopy was in 2008. Denies any hepatobiliary complaints , but does complain of intermittent solid food dysphagia.   I have reviewed this patient's present history, medical and surgical past history, allergies and medications.     ROS: The remainder of the 10 point ROS is negative     Physical Exam: General well developed well nourished patient in no acute distress, appearing their stated age Eyes PERRLA, no icterus, fundoscopic exam per opthamologist Skin no lesions noted Neck supple, no adenopathy, no thyroid enlargement, no tenderness Chest clear to percussion and auscultation Heart no significant murmurs, gallops or rubs noted Abdomen no hepatosplenomegaly masses or tenderness, bowel sounds are normal. I cannot appreciate significant umbilical hernia at this time.. Extremities no acute joint lesions, edema, phlebitis or evidence of cellulitis. Neurologic patient oriented x 3, cranial nerves intact, no focal neurologic deficits noted. Psychological mental status normal and normal affect.  Assessment and plan: Recently diagnosed duodenal ulcer with upper gastrointestinal  hemorrhage and negative evaluation for H. pylori. He continues to have mostly reflux symptoms despite twice a day PPI therapy. Have decided to repeat his endoscopy, check stool antigen for H. pylori, and repeat his labs. I am not sure he would benefit from repeat umbilical hernia surgery but may benefit from fundoplication. I have asked him to continue to avoid NSAIDs in all forms. There is no reason in his history or exam to suggest hypergastrinemia.  Encounter Diagnoses  Name Primary?  . Melena   . Esophageal reflux   . Duodenal ulcer

## 2011-01-09 ENCOUNTER — Other Ambulatory Visit: Payer: Managed Care, Other (non HMO)

## 2011-01-09 ENCOUNTER — Telehealth: Payer: Self-pay | Admitting: *Deleted

## 2011-01-09 DIAGNOSIS — K219 Gastro-esophageal reflux disease without esophagitis: Secondary | ICD-10-CM

## 2011-01-09 DIAGNOSIS — K921 Melena: Secondary | ICD-10-CM

## 2011-01-09 MED ORDER — TANDEM PLUS 162-115.2-1 MG PO CAPS: 1.0000 | ORAL_CAPSULE | Freq: Every day | ORAL | Status: DC

## 2011-01-09 NOTE — Telephone Encounter (Signed)
Patient aware of labs and rx called in.

## 2011-01-09 NOTE — Telephone Encounter (Signed)
Message copied by Harlow Mares on Wed Jan 09, 2011  2:21 PM ------      Message from: Jarold Motto, DAVID      Created: Wed Jan 09, 2011  9:29 AM       DAILY TANDEM

## 2011-01-11 ENCOUNTER — Encounter: Payer: Self-pay | Admitting: Gastroenterology

## 2011-01-14 ENCOUNTER — Encounter: Payer: Self-pay | Admitting: Gastroenterology

## 2011-01-14 ENCOUNTER — Ambulatory Visit (AMBULATORY_SURGERY_CENTER): Payer: Managed Care, Other (non HMO) | Admitting: Gastroenterology

## 2011-01-14 DIAGNOSIS — K219 Gastro-esophageal reflux disease without esophagitis: Secondary | ICD-10-CM

## 2011-01-14 DIAGNOSIS — D131 Benign neoplasm of stomach: Secondary | ICD-10-CM

## 2011-01-14 DIAGNOSIS — K2980 Duodenitis without bleeding: Secondary | ICD-10-CM

## 2011-01-14 DIAGNOSIS — K429 Umbilical hernia without obstruction or gangrene: Secondary | ICD-10-CM

## 2011-01-14 DIAGNOSIS — Z8711 Personal history of peptic ulcer disease: Secondary | ICD-10-CM

## 2011-01-14 DIAGNOSIS — K269 Duodenal ulcer, unspecified as acute or chronic, without hemorrhage or perforation: Secondary | ICD-10-CM | POA: Insufficient documentation

## 2011-01-14 DIAGNOSIS — R1013 Epigastric pain: Secondary | ICD-10-CM

## 2011-01-14 DIAGNOSIS — R109 Unspecified abdominal pain: Secondary | ICD-10-CM

## 2011-01-14 MED ORDER — SODIUM CHLORIDE 0.9 % IV SOLN: 500.0000 mL | INTRAVENOUS | Status: DC

## 2011-01-14 NOTE — Patient Instructions (Addendum)
Dr.Patterson found duodenitis, biopsies taken. You will receive result letter in your mail in about 1-2 weeks. Avoid NSAIDS! Follow up with Surgical Consult. Resume regular medications today.

## 2011-01-15 ENCOUNTER — Telehealth: Payer: Self-pay | Admitting: *Deleted

## 2011-01-15 DIAGNOSIS — K2980 Duodenitis without bleeding: Secondary | ICD-10-CM

## 2011-01-15 NOTE — Telephone Encounter (Signed)
No identifier on mobile and home phone, no message left.

## 2011-01-16 ENCOUNTER — Telehealth: Payer: Self-pay | Admitting: *Deleted

## 2011-01-16 NOTE — Telephone Encounter (Signed)
On recommendations listed on ENDO report, Dr Jarold Motto wrote f/u on surgical referral. Pt reports he saw Dr Rayburn Ma yesterday and his surgical date is 01/30/11.

## 2011-01-16 NOTE — Progress Notes (Signed)
WHERE IS THE RESULT ???

## 2011-01-18 ENCOUNTER — Encounter: Payer: Self-pay | Admitting: Gastroenterology

## 2011-01-25 ENCOUNTER — Ambulatory Visit: Payer: Managed Care, Other (non HMO) | Admitting: Cardiology

## 2011-01-29 HISTORY — PX: UMBILICAL HERNIA REPAIR: SHX196

## 2011-02-04 ENCOUNTER — Emergency Department (HOSPITAL_COMMUNITY): Payer: Managed Care, Other (non HMO)

## 2011-02-04 ENCOUNTER — Inpatient Hospital Stay (HOSPITAL_COMMUNITY)
Admission: EM | Admit: 2011-02-04 | Discharge: 2011-02-09 | DRG: 394 | Disposition: A | Discharge status: Home / Self Care | Payer: Managed Care, Other (non HMO) | Attending: Surgery | Admitting: Surgery

## 2011-02-04 ENCOUNTER — Encounter (HOSPITAL_COMMUNITY): Payer: Self-pay

## 2011-02-04 DIAGNOSIS — M545 Low back pain, unspecified: Secondary | ICD-10-CM | POA: Diagnosis present

## 2011-02-04 DIAGNOSIS — K219 Gastro-esophageal reflux disease without esophagitis: Secondary | ICD-10-CM | POA: Diagnosis present

## 2011-02-04 DIAGNOSIS — K560 Paralytic ileus: Secondary | ICD-10-CM | POA: Diagnosis present

## 2011-02-04 DIAGNOSIS — G8929 Other chronic pain: Secondary | ICD-10-CM | POA: Diagnosis present

## 2011-02-04 DIAGNOSIS — E860 Dehydration: Secondary | ICD-10-CM | POA: Diagnosis present

## 2011-02-04 DIAGNOSIS — Y838 Other surgical procedures as the cause of abnormal reaction of the patient, or of later complication, without mention of misadventure at the time of the procedure: Secondary | ICD-10-CM | POA: Diagnosis present

## 2011-02-04 DIAGNOSIS — I10 Essential (primary) hypertension: Secondary | ICD-10-CM | POA: Diagnosis present

## 2011-02-04 DIAGNOSIS — K929 Disease of digestive system, unspecified: Principal | ICD-10-CM | POA: Diagnosis present

## 2011-02-04 LAB — COMPREHENSIVE METABOLIC PANEL
ALT: 12 U/L (ref 0–53)
AST: 19 U/L (ref 0–37)
CO2: 26 mEq/L (ref 19–32)
Calcium: 10.4 mg/dL (ref 8.4–10.5)
Chloride: 100 mEq/L (ref 96–112)
GFR calc Af Amer: >60 mL/min (ref >60)
GFR calc non Af Amer: >60 mL/min (ref >60)
Sodium: 140 mEq/L (ref 135–145)

## 2011-02-04 LAB — URINE MICROSCOPIC-ADD ON

## 2011-02-04 LAB — URINALYSIS, ROUTINE W REFLEX MICROSCOPIC
Glucose, UA: NEGATIVE mg/dL
Leukocytes, UA: NEGATIVE
Nitrite: NEGATIVE
Specific Gravity, Urine: >1.046 — ABNORMAL HIGH (ref 1.005–1.030)
pH: 5.5 (ref 5.0–8.0)

## 2011-02-04 LAB — DIFFERENTIAL
Basophils Relative: 0 % (ref 0–1)
Eosinophils Absolute: 0 10*3/uL (ref 0.0–0.7)
Lymphs Abs: 0.9 10*3/uL (ref 0.7–4.0)
Monocytes Relative: 7 % (ref 3–12)
Neutro Abs: 9.7 10*3/uL — ABNORMAL HIGH (ref 1.7–7.7)
Neutrophils Relative %: 85 % — ABNORMAL HIGH (ref 43–77)

## 2011-02-04 LAB — CBC
Hemoglobin: 16 g/dL (ref 13.0–17.0)
Platelets: 289 10*3/uL (ref 150–400)
RBC: 5.56 MIL/uL (ref 4.22–5.81)
WBC: 11.5 10*3/uL — ABNORMAL HIGH (ref 4.0–10.5)

## 2011-02-04 MED ORDER — IOHEXOL 300 MG/ML  SOLN
100.0000 mL | Freq: Once | INTRAMUSCULAR | Status: AC | PRN
  Administered 2011-02-04: 100 mL via INTRAVENOUS

## 2011-02-05 ENCOUNTER — Inpatient Hospital Stay (HOSPITAL_COMMUNITY): Payer: Managed Care, Other (non HMO)

## 2011-02-05 LAB — CBC
HCT: 40.1 % (ref 39.0–52.0)
MCH: 28.6 pg (ref 26.0–34.0)
MCHC: 33.2 g/dL (ref 30.0–36.0)
MCV: 86.2 fL (ref 78.0–100.0)
RDW: 12.6 % (ref 11.5–15.5)
WBC: 9.9 10*3/uL (ref 4.0–10.5)

## 2011-02-05 LAB — BASIC METABOLIC PANEL
BUN: 15 mg/dL (ref 6–23)
Creatinine, Ser: 1.1 mg/dL (ref 0.4–1.5)
GFR calc non Af Amer: >60 mL/min (ref >60)
Glucose, Bld: 91 mg/dL (ref 70–99)
Potassium: 3.6 mEq/L (ref 3.5–5.1)

## 2011-02-07 ENCOUNTER — Inpatient Hospital Stay (HOSPITAL_COMMUNITY): Payer: Managed Care, Other (non HMO)

## 2011-02-12 NOTE — Assessment & Plan Note (Signed)
[facility]                         GASTROENTEROLOGY OFFICE NOTE   NAME:[name], Jason MCLUCKIE                       MRN:          [id number]  DATE:08/10/2007                            DOB:          03/17/1966    Jason [name] continues with his left lower quadrant chronic low-grade  discomfort without change from previous settings.  He is on Librax 3  times a day and high-fiber diet with daily Citrucel.  He currently is  undergoing a cardiac evaluation.  His old chart is not available for  review.   Jason [name] has had both CT scan and colonoscopy which have not shown any  evidence of acute diverticulitis; and CT scan, otherwise, has been  normal.  He does suffer from chronic anxiety syndrome, also on Toprol  daily, and Percocet.   He weighs 198 pounds and blood pressure is 132/82 and pulse was 80 and  regular.   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  He obviously is having noncardiac chest pain and I  suspect has a host a functional disorders.   RECOMMENDATIONS:  1. Continue high-fiber diet, daily Citrucel and liberal p.o. fluids.  2. Discontinue Librax and try Pamine Forte 5 mg twice a day.  3. The patient needs primary care establishment and followup.     Vania Rea. Jarold Motto, MD, [facility], FAGA  Electronically Signed    DRP/MedQ  DD: 08/10/2007  DT: 08/11/2007  Job #: [id number]   cc:   Georgina Quint. Plotnikov, MD

## 2011-02-12 NOTE — H&P (Signed)
NAMEKREGG, CIHLAR                ACCOUNT NO.:  [id number]   MEDICAL RECORD NO.:  [id number]          PATIENT TYPE:  INP   LOCATION:  3041                         FACILITY:  [facility]   PHYSICIAN:  Norton Blizzard, M.D.    DATE OF BIRTH:  1966-01-03   DATE OF ADMISSION:  06/11/2007  DATE OF DISCHARGE:  06/16/2007                              HISTORY & PHYSICAL   PRIMARY CARE PHYSICIAN:  [facility].   ATTENDING PHYSICIAN:  Dr. Jennette Kettle.   PROCEDURES:  1. Ultrasound of the scrotum June 11, 2007.  Impression:  Left      epididymal orchitis.  No signs of abscess.  2. CT abdomen June 11, 2007.  Impression:  No acute findings.  3. Abdominal x-ray June 15, 2007.  Impression:  Contrast from the      previous CT is now within the distal colon.  Nonspecific bowel-gas      pattern with no evidence of obstruction or free air.  4. Ultrasound of scrotum June 15, 2007.  Impression:  Improving,      continued changes of epididymal orchitis in the left without      abscess.  No inguinal hernia seen on either side by ultrasound or      CT.   REASON FOR ADMISSION:  Admitted from [facility] with  scrotum/abdominal pain, fever, nausea, tachycardia.   PERTINENT ADMISSION LABS:  Temperature 100.9, heart rate 111,  respiratory 24, blood pressure 129/69, O2 94%.  The patient came with  labs from [facility] showing white blood count 14.3, EKG with no  acute ST changes, Q wave changes, T wave inversions.  The UA showed no  leukocytes, nitrates, blood, white blood cells, or protein.   HISTORY OF PRESENT ILLNESS:  A 45 year old white male with questionable  history of ulcerative colitis, anklyosing spondylitis, and hypertension  who presents for a two week history of worsening left scrotum and left  lower quadrant abdominal pain.  Please see dictated HPI for full details  of initial presentation and work-up.   PRIMARY DISCHARGE  DIAGNOSES:  1. Epididymitis, resolving.  2. Abdominal pain.   SECONDARY DISCHARGE DIAGNOSES:  1. Hypertension.  2. Ankylosing spondylitis.  3. Anxiety.   MEDICATIONS:  Stopped and not restarted:  Skelaxin - has not needed it  as an inpatient.   New or changed medications on discharge:  1. MiraLax 17 grams b.i.d.  2. Colace 100 mg daily.  3. Simethicone 80 mg p.o. daily p.r.n.  4. Ciprofloxacin 500 mg b.i.d. x9 days for a total of 14 days.  5. Senna two tablets daily.  6. Benefiber.   Home medications continued on discharge:  1. Aspirin 81 mg.  2. Klonopin 1 mg q.i.d.  3. Phenergan p.r.n.  4. Percocet one to two tablets q.6 h. p.r.n. pain.   BRIEF HOSPITAL COURSE:  1. Epididymitis.  The patient had ultrasounds performed on admission      that was suggestive of epididymitis and no signs of testicular      torsion.  He was started on Cipro 500 mg  q.8 h.  Over the hospital      course, his pain improved, as well as his objective markers      improved.  Repeat testicular ultrasound on June 15, 2007,      showed improvement.  White blood count on discharge is 6.0 and he      has been afebrile for greater than 48-hours.  We will discharge on      Ciprofloxacin for a total of 14 days.  Follow up with primary care      physician.  2. Abdominal pain:  The patient was empirically started on Cipro and      Flagyl 500 mg t.i.d., pending results of imaging to rule out      diverticulitis.  The patient had a CT of the abdomen on admission      that showed no acute findings.  The patient continued to have      abdominal pain had increasing feelings of abdominal bloating and      distension which prevented him from tolerating much by mouth.  On      June 15, 2007, he had an elevated creatinine when his IV      fluids were stopped the day before which promptly resolved upon      restarting IV fluids.  Several days of MiraLax, Colace, Fleets      enemas and Senna, yielded two  small bowel movements but did not      relieve the bloating.  C. difficile assay was negative as well as      Hemoccult x2.  His history is suggestive of irritable bowel      syndrome.  GI was consulted and has recommended outpatient follow      up with colonoscopy.  The patient will be discharged without Flagyl      as no infectious etiology has been found.  He will go home on a      bowel regimen and Percocet for relief.  Also follow up with GI.  3. Tachycardia.  Likely cause secondary to pain.  The patient notes      history of tachycardia without productive work-up many years ago.      On admission, he had a heart rate of 111.  EKG from [facility] showed      no acute changes.  He was placed on telemetry on admission which      was discontinued after 24-hours of no changes seen.  On the day of      discharge, tachycardia had resolved and heart rate was noted at 78.  4. Hypertension.  The patient stated he had a history of hypertension      that was untreated secondary to high medication costs.  He has not      had any elevated blood pressures this admission and, thus, has not      been treated.  5. Anxiety.  The patient was continued on home medications of Klonopin      1 mg q.i.d. p.r.n., and stable at discharge.   PERTINENT DISCHARGE LABS:  Creatinine 1.11.  White blood count 6.0.  Temperature 97.7, heart rate 88, respirations 18 to 20, blood pressure  105/61.   CONDITION ON DISCHARGE:  Stable.   PENDING TESTS:  None.   DISPOSITION:  Discharged to home.   FOLLOWUP APPOINTMENT:  1. Primary care physician.  Please call primary care physician at      [facility] for follow up in the next week.  2. GI:  Establish of new GI doctor, outpatient colonoscopy.  Follow up      with Dr. Sheryn Bison on June 23, 2007, at 1:45 p.m. with      [place].   FOLLOWUP ISSUES OR PRIMARY CARE PHYSICIANS:  Resolution of epididymitis,  GI follow up of abdominal pain.   DISCHARGE INSTRUCTIONS:   1. Eat fiber.  2. Push p.o. fluids.  3. Diet and activity as tolerated.  4. Important to keep follow up appointments.  5. Return to ER if severe pain or fever greater than 100.5.  6. May return to work on Friday, June 19, 2007.      Norton Blizzard, M.D.  Electronically Signed     Norton Blizzard, M.D.  Electronically Signed    SH/MEDQ  D:  06/16/2007  T:  06/17/2007  Job:  [id number]

## 2011-02-12 NOTE — Assessment & Plan Note (Signed)
[facility]                         GASTROENTEROLOGY OFFICE NOTE   NAME:Kroboth, TAMMIE ELLSWORTH                       MRN:          [id number]  DATE:06/23/2007                            DOB:          [birth year]    Mr. Diclemente is a 45 year old white male that I am inheriting from Dr. Victorino Dike for his gastrointestinal care.   Kathlene November was recently hospitalized from 9/11 to 9/16 on the [facility] with abdominal pain, fever, and what appeared to be  acute epididymitis. He had ultrasound and CT scan of his abdomen at that  time that otherwise was unremarkable and his white count was normal. He  was empirically treated with Cipro and Flagyl for suspected  diverticulitis and seemed to respond to therapy. He was discharged to be  followed up in our office. It is unclear to me exactly who his family  physician is at this time. During his hospitalization, his lab data was  otherwise unremarkable without evidence of significant anemia or  abnormal liver function test.   Szymon still complains of some left lower quadrant discomfort and has  rather severe chronic functional constipation which has been a problem  for years. He says that occasionally he will take laxatives when he has  loose stools, but denies melena or hematochezia. He has had no anorexia,  weight loss, or systemic complaints at this time except for general  malaise or chronic fatigue. He has three more days of his antibiotic  therapy to continue. He denies upper GI or hepatobiliary complaints. He  does follow a regular diet and says that he takes fiber supplements and  liberal p.o. fluids.   PAST MEDICAL HISTORY:  Remarkable for chronic anxiety syndrome,  hypertension, and recurrent upper respiratory infections. I believe that  he is followed by Dr. Posey Rea who treats his hypertension,  hyperlipidemia, and anxiety.   He is currently on:  1. Klonopin 1 mg p.r.n.  2. Zyrtec 10  mg daily.  3. Cipro 500 mg twice daily.  4. Colace 100 mg daily.  5. Percocet p.r.n.  6. Phenergan p.r.n.   GASTROENTEROLOGY HISTORY:  The patient had been followed by Dr. Victorino Dike for many years and has been diagnosed as having colitis. He  obviously has GERD and has been on PPI therapy in the past. His last  endoscopy exam was in April 2002 where he had some non-specific  gastritis. I cannot see where CLO biopsy was done. Colonoscopy of  mucosal biopsies were unremarkable. Exactly where this diagnosis of  colitis came from is unclear to me on reviewing his chart. The patient  has had a long history of polyarticular arthralgias. He has ankylosing  spondylitis and is followed by Dr. Kellie Simmering. He is not on any  immunosuppressant therapy. He has also seen neurology because of chronic  headaches and a chronic unexplained tachycardia which persists. 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 He is  status post acute appendicitis with  laparoscopic appendectomy and repair of an umbilical hernia by Dr.  Derrell Lolling. Additional problems have included lumbosacral spine disease with  previous laminectomies by Dr. Sharolyn Douglas.   PHYSICAL EXAMINATION:  GENERAL:  He is a somewhat grayish appearing  white male who is not lethargic, but somewhat depressed in appearance.  VITAL SIGNS:  Weight 202 pounds, blood pressure 120/84, pulse 120 and  regular.  CARDIAC:  He appears to be in a regular rhythm without significant  murmurs, gallops, or rubs.  CHEST:  Generally clear.  ABDOMEN:  I could not appreciate hepatosplenomegaly, abdominal masses,  or significant tenderness, although there was some tenderness to deep  palpation of the left lower quadrant area. Bowel sounds were normal.  RECTAL:  Showed no masses or tenderness with soft stool present and was  guaiac  negative.   ASSESSMENT:  1. Persistent left lower quadrant pain with severe constipation in a      45 year old white male who probably has resolving subacute      diverticulitis.  2. Chronic anxiety syndrome.  3. History of chronic headaches of unexplained etiology.  4. 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  5. Persistent tachycardia of unexplained etiology.  6. Previous thyroid function tests in 2001 were unremarkable.  7. Status post appendectomy.  8. History of chronic GERD without treatment at this time.   RECOMMENDATIONS:  1. Trial of Amitiza 8 mcg twice daily while continuing high fiber      diet, Benefiber, liberal p.o. fluids.  2. Outpatient colonoscopy follow up.  3. We will check some screening laboratory parameters including repeat      thyroid function tests and other metabolic studies.     Vania Rea. Jarold Motto, MD, [facility] Essex, FAGA  Electronically Signed    DRP/MedQ  DD: 06/23/2007  DT: 06/24/2007  Job #: [id number]   cc:   Aundra Dubin, M.D.  Georgina Quint. Plotnikov, MD

## 2011-02-12 NOTE — Assessment & Plan Note (Signed)
[facility]                            CARDIOLOGY OFFICE NOTE   NAME:Jason [name], Jason [name]                       MRN:          [id number]  DATE:09/01/2007                            DOB:          11-26-1965    Jason [name] is a 45 year old gentleman whom I recently evaluated for chest  pain that was atypical, palpitations and shortness of breath.  We  scheduled him for a Myoview which was performed on August 10, 2007.  There was no sign of scar or ischemia, and his ejection fraction was  67%.  There was no chest pain or electrocardiographic changes.  He also  had an echocardiogram performed on August 10, 2007.  His left  ventricular function was normal.  There was trivial aortic  insufficiency, as well as mitral regurgitation.  He also had a Holter  monitor that showed occasional PAC's.  Since I saw him previously, he  had mild dyspnea at times that can be related to exertion, but also  occurs at rest.  Occasionally, he feels a chest tightness.  He has not  had any further palpitations.   MEDICATIONS:  1. Toprol 25 mg p.o. daily.  2. Klonopin 1 mg p.o. daily.  3. Chlordiazepoxide t.i.d.   PHYSICAL EXAMINATION:  VITAL SIGNS:  His exam today shows a blood  pressure of 141/87, pulse 97.  He weighs 201 pounds.  HEENT:  Normal.  NECK:  Supple.  CHEST:  Clear.  CARDIOVASCULAR:  Regular rhythm.  ABDOMEN:  No tenderness.  EXTREMITIES:  No edema.   ELECTROCARDIOGRAM:  Sinus rhythm at a rate of 91.  There were no  significant ST changes.   DIAGNOSES:  1. Recent atypical chest pain.  His Myoview shows no perfusion, no      electrocardiographic changes and no symptoms with exercise.  I,      therefore, do not think we need to pursue further cardiac work-up.  2. Dyspnea.  His left ventricular function is normal.  3. Palpitations.  He has an occasional premature atrial contraction,      but otherwise no significant arrhythmia is noted.  He will continue  on his present dose of Toprol.  4. Gastroesophageal reflux disease/ulcerative colitis/irritable bowel      syndrome/diverticulitis.  This is per GI.  5. History of anxiety syndrome, per his primary care physician.  6. Hypertension.  7. Chronic back pain.   We will see him back on an as-needed basis.     Madolyn Frieze Jens Som, MD, [facility]  Electronically Signed    BSC/MedQ  DD: 09/01/2007  DT: 09/01/2007  Job #: [id number]   cc:   Silas Sacramento, M.D.

## 2011-02-12 NOTE — Assessment & Plan Note (Signed)
HEALTHCARE                            CARDIOLOGY OFFICE NOTE   NAME:Jason [name], Jason [name]                       MRN:          [id number]  DATE:07/21/2007                            DOB:          1966/01/28    SUBJECTIVE:  Jason [name] is a 45 year old male who presents for evaluation  of palpitations, chest pain and shortness of breath.  Note, I saw him in  September 1998.  At that time he was complaining of chest pain and there  was hypertension.  He had an echocardiogram in December 1997.  He was  felt to have normal LV function at that time.  Also note that he had a  Myoview on June 28, 1997, that showed an ejection fraction of 70%  and normal perfusion.  The patient states that he has had continuing  dyspnea, predominantly over the past eight months.  This occurs with  activity but does not occur at rest.  There is no orthopnea or PND, but  he can occasionally have mild pedal edema, by his report.  He also has  palpitations.  He states that his heart rate is typically at 100 and  with any activity it increases to 130-140.  He has not had pre-syncope  or syncope.  He also has chest pain which has been a chronic issue.  He  describes it at present as a spasm under his left breast.  This can  occur with activities or at rest.  It typically lasts for several  minutes.  It does not radiate.  There is no associated nausea, vomiting,  shortness of breath or diaphoresis.  Because of the above, we were asked  to further evaluate.   MEDICATIONS:  1. Zyrtec 10 mg p.o. daily.  2. Toprol 25 mg p.o. daily.  3. Chlordiazepoxide three times daily.  4. Percocet p.r.n.  5. Phenergan p.r.n.  6. Skelaxin p.r.n.   ALLERGIES:  VICODIN.   SOCIAL HISTORY:  He does not smoke.  He only rarely consumes alcohol.  He denies any drug use.   FAMILY HISTORY:  His father died of a myocardial infarction at age 72,  and had his first myocardial infarction at age 35.  His mother  has had  valve replacement, secondary to rheumatic fever.  She also has had a  previous CVA.   PAST MEDICAL/SURGICAL HISTORY:  1. No diabetes mellitus but there is hypertension and hyperlipidemia      by his report.  He has a history of irritable bowel syndrome.  2. Ulcerative colitis.  3. Diverticulitis.  4. History chronic anxiety syndrome.  5. He has a history of multiple back surgeries.  6. He has had a previous umbilical hernia repair, as well as an      appendectomy.  7. History of gastroesophageal reflux disease.   REVIEW OF SYSTEMS:  He has headaches at times.  There are no fevers or  chills or productive cough.  There is no hemoptysis.  There is no  dysphagia or odynophagia or recent melena or hematochezia.  Note that he  was recently hospitalized with  diverticulitis and did have some  hematochezia at that time.  There is no dysuria or hematuria.  There is  no rash or seizure activity.  There is no orthopnea or PND but there can  be pedal edema.  The remainder of the review of systems is negative.   PHYSICAL EXAMINATION:  VITAL SIGNS:  Blood pressure 130/90, pulse 79.  He weighs 200 pounds.  GENERAL:  He is well-developed and well-nourished.  He does not appear  to be in acute distress.  He appears somewhat anxious.  There is no  peripheral clubbing.  SKIN:  Warm and  dry.  BACK:  Status post surgery.  HEENT:  Normal with normal eye lids.  NECK:  Supple with a normal upstroke bilaterally.  I cannot appreciate  bruits.  There is no jugular venous distention and no thyromegaly noted.  CHEST:  Clear to auscultation with no expansion.  CARDIOVASCULAR:  A regular rate and rhythm with normal S1 and S2.  I  could not appreciate murmurs, rubs or gallops.  ABDOMEN:  Nontender, non-distended.  Positive bowel sounds.  No  hepatosplenomegaly, no masses appreciated.  There is no abdominal bruit.  EXTREMITIES:  He has 2+ femoral pulses bilaterally.  No bruits.  No  clubbing.  He has  2+ posterior tibial pulses bilaterally.  NEUROLOGIC:  Grossly intact.   LABORATORY DATA:  From June 23, 2007.  His hemoglobin at that time  was 13.5.  His sedimentation rate was 22.  His renal function was  normal.  His TSH was normal at 1.04.   An electrocardiogram today shows a sinus rhythm at a rate of 79.  There  are no ST changes noted.   DIAGNOSES/PLAN:  1. Atypical chest pain:  We will schedule him to have a stress Myoview      for risk stratification.  If it shows normal perfusion, then we      will not pursue further ischemia evaluation.  2. Dyspnea:  There is no evidence of volume overload on examination      today.  I will check an echocardiogram to quantify his left      ventricular function.  3. Palpitations:  He states that he has this elevated heart rate on a      daily basis.  We will therefore check a 48-hour Holter monitor, to      exclude significant arrhythmia.  We could consider increasing his      Toprol in the future, both for his blood pressure and for his      palpitations, pending those results.  4. Gastroesophageal reflux disease/ulcerative colitis/irritable bowel      syndrome/diverticulitis:  GI will continue to manage these issues.  5. Chronic anxiety syndrome:  Per his primary care physician.  6. Hypertension:  We may need to increase his beta blocker in the      future.  7. Chronic back pain:  Per his primary care physician.   FOLLOWUP:  I will see him back in four weeks.     Madolyn Frieze Jens Som, MD, [facility]  Electronically Signed    BSC/MedQ  DD: 07/21/2007  DT: 07/21/2007  Job #: [id number]   cc:   Silas Sacramento, M.D.

## 2011-02-12 NOTE — H&P (Signed)
NAMEFAHEEM, ZIEMANN                ACCOUNT NO.:  [id number]   MEDICAL RECORD NO.:  [id number]          PATIENT TYPE:  INP   LOCATION:  4705                         FACILITY:  [facility]   PHYSICIAN:  Norton Blizzard, M.D.    DATE OF BIRTH:  1965-12-01   DATE OF ADMISSION:  06/11/2007  DATE OF DISCHARGE:                              HISTORY & PHYSICAL   HISTORY OF PRESENT ILLNESS:  45 year old white male with history of  ulcerative colitis, ankylosing spondylitis and hypertension.  He  presents at the referral from [facility] for left lower quadrant  pain, sinus tachycardia and hypertension.  He reports he first began  having abdominal pain 2 weeks ago, that was mild with a low-grade fever  and associated fatigue, belching and indigestion.  He self-treated with  Tylenol and a few leftover azithromycin pills with mild relief.  The  pain and fever did not completely resolve and last weekend he had 2 days  of explosive diarrhea that was blood-tinged.  He treated himself with  Phazyme and Gas-Ex and the diarrhea self resolved.  Two days ago, the  pain became a sharp pain in his left lower quadrant and pelvis that  radiated to his left scrotum.  The pain was constant and 8/10 with no  alleviating factors.  One day ago, he spiked 101 degree fever and had  nausea and dry heaving, anorexia and pain, which increased to 10/10.  Today he went to work, noted pressure and the pain was worse when  standing still with the left greater than right-sided pain and now  becoming more diffuse with bloating.  He then presented to [facility].  There he was found to be tachycardiac with a heart rate of 126,  temperature of 100.3, blood pressure 147/95.  An EKG showed sinus  tachycardia with no acute ST changes, Q waves or T wave inversions.  Urinalysis with micro was negative.  WBC is 14.3 with 81.2%  granulocytes.  He was then transferred to [facility] for admission.   PAST MEDICAL HISTORY:   Hypertension, hiatal hernia, ankylosing  spondylitis, ulcerative colitis, diagnosed on colonoscopy 7 years ago,  anxiety, back pain, degenerative disk disease.   SURGICAL HISTORY:  Umbilical hernia repair in 1998.  Appendectomy in  1998.  Lower spine fusion in 2000, 2003, 2005.  Cervical spine fusion in  2000, 2001, 2003, 2005.   FAMILY HISTORY:  Mom:  Stroke x3, first at age 21, hypertension,  rheumatoid arthritis.  Dad:  Died at 15 of MI, also had diabetes  mellitus.  Brother:  Leukemia, died at age 37.  Sister:  6 months, died  of congenital heart defect.  Sister:  Bipolar.   SOCIAL HISTORY:  Lives alone in [place], [place].  His  separated wife and children live in the [place] area.  He works full-  time at [employer] and also works on weekends as a Artist.  No tobacco.  One wine per week.  No drugs.   MEDICATIONS:  1. Klonopin 1 mg q.i.d.  2. Aspirin 81 mg daily.  3. Percocet 10/325 mg p.r.n.  4. Skelaxin, Phenergan.   ALLERGIES:  Vicodin causes tachycardia.   REVIEW OF SYSTEMS:  GENERAL:  Positive fevers, chills, fatigue.  HEENT:  Positive headache, nausea, negative emesis.  CARDIOVASCULAR:  Positive  tachycardia, palpitations, dyspnea on exertion.  RESPIRATORY:  Negative  cough, wheezing or hemoptysis.  GI:  Positive anorexia, indigestion,  bloating, diarrhea, constipation, blood-tinged stools, abdominal pain.  GENITOURINARY:  Positive dysuria.  Negative polyuria, incontinence.  GENITALS:  Negative for discharge, sores, hernia, positive for  testicular pain.   PHYSICAL EXAMINATION:  GENERAL:  Well-nourished white male appears his  age.  No acute distress.  VITALS:  Temperature 100.9, heart rate 11, respiratory 24, BP 129/69.  O2 is 94% on room air.  HEENT:  Normocephalic, atraumatic.  EOMI.  PERRLA.  Nonicteric, nares  patent, TMs intact.  Oral mucosa dry.  NECK:  No masses, lymphadenopathy, thyromegaly.  CARDIOVASCULAR:  Regular  rate and rhythm.  No murmurs, rubs or gallops.  No JVD.  RESPIRATORY:  Clear to auscultation bilaterally.  No increased work or  breathing.  ABDOMEN:  Hyperactive bowel sounds, soft, nondistended.  Mildly tender  in the right upper quadrant, right lower quadrant and left upper  quadrant.  Very tender in left lower quadrant with no guarding or  rebound.  Negative Murphy's sign.  No hepatosplenomegaly.  No CVA.  Palpation in left lower quadrant and scrotum reproduces pain he has been  feeling.  GENITOURINARY:  No penile discharge or lesions.  Left scrotum  exquisitely tender with mild alleviation of pain when supporting scrotal  sac.  No swelling, torsion or varicoceles appreciated.  No inguinal  hernia felt.   ASSESSMENT/PLAN:  45 year old white male with history of ulcerative  colitis, ankylosing spondylitis, hypertension, with 2-week history of  abdominal pain, 3 days of acute left lower quadrant pain.  1. Abdominal pain.  Given history of  ulcerative colitis, abdominal      may be colitis versus diverticulitis versus testicular torsion.      Will obtain a stat ultrasound with Doppler to rule out testicular      torsion.  We will then proceed with CT abdomen after CMP lab      results are in.  In the meantime, we will obtain blood cultures x2,      C. difficile stool x2, hemoccult fecal leukocytes in light of      possibility of C. difficile secondary to antibiotic use.  Abdominal      pain not likely due to ischemia due to no finding of gross blood      and that he is not acutely distressed.  Will empirically treat with      Zosyn and symptomatic relief with Phenergan and simethicone.  No      hernia is noted on exam, so bowel incarceration unlikely.  Tender      testicle suggests epididymitis with possible epididymitis orchitis.      Will follow up on labs and studies ordered.  2. Tachycardia, likely secondary to pain.  Patient relates distant      history of tachycardia, but no  cause was found and/or was not fully      evaluated.  Patient has a history of anxiety.  EKG obtained by      [facility] showed no acute ST changes, Q wave or T wave      inversions.  Will monitor patient on telemetry.  3. Hypertension.  Untreated due to patient financial situation  per      patient's report.  At present, patient is not hypertensive.  May      consider metoprolol, which will also address tachycardia if further      hypertension.  4. Anxiety.  Continue home regimen of Klonopin.  5. GI/FEN:  Protonix 40 mg p.o. daily for prophylaxis.  Patient is      ambulatory and not at increased DVT risk.  Aspirin 81 mg per home      regimen.  Will bolus 500 mL normal saline and will start D5 half      normal saline at 125 mL/hour for hydration and n.p.o. status.      Norton Blizzard, M.D.  Electronically Signed     SH/MEDQ  D:  06/12/2007  T:  06/12/2007  Job:  [id number]

## 2011-02-15 NOTE — H&P (Signed)
NAME:  Jason [name], Jason [name]                          ACCOUNT NO.:  [id number]   MEDICAL RECORD NO.:  [id number]                   PATIENT TYPE:  INP   LOCATION:  NA                                   FACILITY:  [facility]   PHYSICIAN:  Sharolyn Douglas, M.D.                     DATE OF BIRTH:  May 25, 1966   DATE OF ADMISSION:  DATE OF DISCHARGE:                                HISTORY & PHYSICAL   DATE OF HISTORY AND PHYSICAL:  April 11, 2004   DATE OF ADMISSION:  April 18, 2004   CHIEF COMPLAINT:  Pain in my back and leg.   PRESENT ILLNESS:  This 45 year old white male seen by [place] for continuing  problems concerning his low back.  The patient has had an anterior cervical  discectomy and fusion and has done very well with that but his chronic low  back pain with radiation to the lower extremities left greater than right is  now interfering with his day-to-day activities.  He has had a previous  laminectomy done in the past by Dr. Jordan Likes at L4-5.  The patient has had  steroids - both injectable and by mouth, physical therapy, and pain  medication, as well as behavior modification but unfortunately this has not  ameliorated his discomfort.  He continues low back pain and radiation.  His  straight leg is positive on both sides - more so on the left than the right.  He has no gross weakness in the lower extremities.  The patient had  undergone myelogram and there was seen a severe spinal stenosis at the L3-4  level with diffuse disc bulge.  A posterior element hypertrophy was seen as  well.  At L5 the pedicle screws were well positioned but there was a  persistent left lateral recess narrowing at L5-S1.  Due to this chronic pain  the patient has as well as his increasing need for analgesics without relief  it was felt he would benefit from surgical intervention and is being  admitted for removal of hardware L4-L5 with a revision laminectomy L3-S1  with posterior spinal fusion L3-S1 with pedicle screws,  transforaminal  lumbar interbody fusion, allograft, and bone morphogenic protein.   Dr. Noel Gerold has discussed with him the surgical procedure, the perioperative  plan, and the side effects and complications of the surgery.   PAST MEDICAL HISTORY:  This patient has been in relatively good health  throughout his lifetime.  He does have hypertension, hiatal hernia with  reflux, and a history of ulcerative colitis.  He had anxiety/depressive  episode in January to March 2005.  He had pneumonia in 1991.  Previous  surgery includes appendectomy with umbilical hernia repair in 1997,  discectomy in December 2000, spinal fusion September 2001, the lumbar by Dr.  Jordan Likes, and cervical fusion September 2004 by Dr. Noel Gerold.   CURRENT MEDICATIONS:  1. Nexium 40  mg one daily.  2. Benicar HCT 40/12.5 mg one daily.  3. Percocet 10 - he is taking one or two tablets q.4-6h.   ALLERGIES:  VICODIN causes tachycardia.   Dr. Trinna Post Plotnikov is his family physician.   FAMILY HISTORY:  Positive for heart disease in both his mother and father.  His dad had hypertension and diabetes.   SOCIAL HISTORY:  The patient is separated.  He is senior Teacher, music at  [employer].  Has no intake of tobacco products and occasional intake of ETOH.  He  has two children and his mother will be caregiver after surgery.  He lives  in a townhouse, two-story, 14 stairs.   REVIEW OF SYSTEMS:  CNS:  No seizure disorders, paralysis, numbness, or  double vision but the patient does have radiculopathy consistent with nerve  root impingement as mentioned above.  CARDIOVASCULAR:  No chest pain, no  angina or orthopnea.  RESPIRATORY:  No productive cough, no hemoptysis, no  shortness of breath.  GASTROINTESTINAL:  The patient has had recent  dyspepsia with a burning sensation in the epigastrium.  No nausea, vomiting,  melena, or bloody stool (he is given Nexium today as samples and  prescription).  GENITOURINARY:  No discharge, dysuria,  hematuria.  MUSCULOSKELETAL:  Primarily in present illness.   PHYSICAL EXAMINATION:  GENERAL:  Alert, cooperative, and friendly 45-year-  old white male.  VITAL SIGNS:  Blood pressure 140/72, pulse 84, respirations are 12.  HEENT:  Normocephalic, PERRLA, EOM intact.  Oropharynx clear.  CHEST:  Clear to auscultation, no rhonchi, no rales.  HEART:  With regular rate and rhythm, no murmurs are heard.  ABDOMEN:  Soft, tender in the epigastrium, bowel sounds present.  GENITALIA AND RECTAL:  Not done, not pertinent to present illness.  EXTREMITIES:  As in present illness above.   ADMISSION DIAGNOSES:  1. Adjacent segmental disease with spinal stenosis and degenerative disc     disease.  2. Hypertension.  3. Gastritis with reflux.  4. History of colitis.   PLAN:  The patient will have removal hardware L4-L5 with revision of  laminectomy L3-S1 with posterior spinal fusion L3-S1 with pedicle screws,  and transforaminal lumbar interbody fusion with allograft and bone  morphogenic protein.      Dooley L. Cherlynn June.                 Sharolyn Douglas, M.D.    DLU/MEDQ  D:  04/13/2004  T:  04/13/2004  Job:  [id number]   cc:   Georgina Quint. Plotnikov, M.D. [facility]

## 2011-02-15 NOTE — Assessment & Plan Note (Signed)
[facility]                         GASTROENTEROLOGY OFFICE NOTE   NAME:Jason [name], Jason [name]                       MRN:          [id number]  DATE:02/04/2007                            DOB:          25-Nov-1965    Marcelyn Bruins a 45 year old, says he has been having severe abdominal  pain, some blood in his stool, some constipation.  He has been having  some heartburn, some indigestion, has taken Zantac and Prilosec over the  counter but not effective.  Epigastric pain in the mid epigastric region  with some increasing constipation.  Says he goes 6 or 7 days without a  bowel movement.  Tried MiraLax, gave him cramping, Metamucil.  Bleeding  from rectum sometimes when he does move his bowels, he sees dark, red  blood.  Sees it in the bowl, sees it with wiping.  He has known  hemorrhoids.  He has lost 35 pounds in the past 4 months.  He says he  thinks it might be stress related as he is going through a divorce.  His  appetite has changed, and he said he needed to lose the weight.   PAST MEDICAL HISTORY:  Reveals that he had a colonoscopic evaluation,  January 22, 2001, and an upper endo at the same time.  The upper endo  revealed small hiatal hernia, approximately 2 cm, and some gastritis  which was just mild.  Colonoscopic examination was essentially normal  except for some changes in the rectal sigmoid colon, but consistent with  some mild, underlying colitis.   FAMILY HISTORY:  Reveals that he had maternal grandmother with colon  cancer.   His past medical history reveals he has had hypertension, arthritis,  some depression, especially recently.  Has allergies.  Had a hernia  surgery and appendectomy.  His surgical history is noncontributory.  He  does drink occasionally.   REVIEW OF SYSTEMS:  Reveals some fever, arthritic symptoms, skin rash,  sleeping problems, back pain, itching, shortness of breath, and muscle  cramps and pains.   PHYSICAL  EXAMINATION:  A very healthy-looking 45 year old.  He is 5 feet 11 inches, weighs 200 pounds.  Blood pressure 130/90, pulse  80 and regular.  NECK, HEART, EXTREMITIES:  Unremarkable.   IMPRESSION:  1. Chronic constipation.  2. Rectal bleeding, probably hemorrhoidal.  3. Weight loss, question etiology.  4. Anxiety/depression.  5. Status post history of colitis.  6. Arthritis.  7. Hypertension.  8. Allergies.  9. Hernia surgery.  10.Status post appendectomy.   RECOMMENDATIONS:  Use Analpram HC cream, use the MiraLax until he has  daily bowel activities.  I have talked to him in length about not  letting himself go more than 2 days at most before having a bowel  movement, take whatever he needs, with Senokot and Senna, Dulcolax and  so until he has adequate bowel activity.  I suggested he get a CT of his  abdomen and a colonoscopic examination it would be better over the next  3-6 weeks.  I am worried about the weight loss, but actually do not  think that it is  excessive, and he looks quite good.  It probably was  more intentional than he recognizes.  But if he is doing all right with  the above measures, I would just keep him on a reasonable diet with  Benefiber, MiraLax, Dulcolax, Senokot, whatever.  I told him he was  going to have some cramps, especially if he waits too long to have bowel  activity.  We talked about the increased fluid, exercise, and so on, to  help him have more normal bowel activity.  I think he understands this.  I really did not want to be too aggressive with this 45 year old unless  we had to.     Ulyess Mort, MD  Electronically Signed    SML/MedQ  DD: 02/04/2007  DT: 02/04/2007  Job #: [id number]

## 2011-02-15 NOTE — Op Note (Signed)
NAME:  Jason [name], Jason [name]                          ACCOUNT NO.:  [id number]   MEDICAL RECORD NO.:  [id number]                   PATIENT TYPE:  OIB   LOCATION:  2871                                 FACILITY:  [facility]   PHYSICIAN:  Sharolyn Douglas, M.D.                     DATE OF BIRTH:  09-06-1966   DATE OF PROCEDURE:  06/16/2003  DATE OF DISCHARGE:                                 OPERATIVE REPORT   PREOPERATIVE DIAGNOSIS:  Cervical spondylotic radiculopathy.   POSTOPERATIVE DIAGNOSIS:  Cervical spondylotic radiculopathy.   PROCEDURE:  1. Anterior cervical discectomy with decompression of the right C5 nerve     root.  2. Anterior cervical arthrodesis C4-C5.  3. Placement of a 7 mm Nuvasive allograft prosthesis spacer packed with     local autogenous bone graft.  4. Anterior cervical plating utilizing the Trinica system C4-C5.   SURGEON:  Sharolyn Douglas, M.D.   ASSISTANT:  Verlin Fester, P.A.   ANESTHESIA:  General endotracheal anesthesia.   COMPLICATIONS:  None.   INDICATIONS FOR PROCEDURE:  The patient is a 45 year old male with history  of neck and right upper extremity pain.  Plain radiograph showed mild  degenerative changes and loss of the cervical lordosis.  His MRI scan  demonstrated foraminal stenosis at C4-C5 secondary to spondylotic changes.  He had a right C5 selective nerve root injection with temporary improvement  in his symptoms.  At this point, he is completely miserable with neck and  right upper extremity pain.  He has been refractory at all nonoperative  management and has elected to undergo ACDF at C4-C5 in hopes of improving  his symptoms.  The risks, benefits, and alternatives were extensively  discussed with the patient.   PROCEDURE:  The patient was properly identified in the holding area, was  taken to the operating room.  He underwent general endotracheal anesthesia  without difficulty.  Neuromonitoring was established in the form of SSEPs  and upper extremity  EMGs.  He was carefully positioned on the operating  table with his neck in slight hyperextension.  5 pounds of halter traction  was applied.  The neck was prepped and draped in the usual sterile fashion.  A 4 cm incision was made on the left side in a natural skin crease at the  level of the thyroid cartilage.  Dissection was carried sharply through the  platysma.  The interval between the SCM and the strap muscles medially was  developed down to the prevertebral space.  The esophagus, trachea, and  carotid sheath were identified and protected at all times.  The first disc  space identified was C4-C5.  A spinal needle was placed, x-ray was taken,  confirmed this location.  We then elevated the longus colli muscle out over  the C4-C5 disc space bilaterally.  The Caspar distraction pins were placed.  A discectomy was performed back  to the posterior longitudinal ligament.  We  noted uncovertebral spurring more prominent on the right side.  The disc  material was degenerative.  The cartilaginous endplates were scraped of all  cartilaginous material.  We then used a high speed bur to perform a  foraminotomy on the right side.  A 2 mm Kerrison punch was used to complete  the decompression.  The foramina was explored using a blunt probe and found  to be decompressed.  We then placed a 7 mm allograft prosthesis spacer which  had been packed with local autogenous bone graft collected from the bur  shavings.  The Caspar distraction pins were removed.  The Halter traction  was removed.  A 26 mm Trinica plate was then placed with four 14 mm screws.  We insured the locking mechanism was engaged.  The esophagus, trachea, and  carotid sheath were examined and there were no apparent injuries.  Interoperative x-rays showed excellent positioning of the plate and screws.  Bleeding was controlled with bipolar electrocautery and Gelfoam.  A deep  Penrose drain was placed.  The platysma was closed with an  interrupted 2-0  Vicryl, the subcutaneous layer closed with an interrupted 3-0 Vicryl,  followed by a 4-0 subcuticular Vicryl suture on the skin.  Benzoin and Steri-  Strips were applied.  A sterile dressing was placed.  The patient was placed  into a soft collar, extubated without difficulty, transferred to the  recovery room, able to move his upper and lower extremities, in good  condition.  There were no changes in the SSEPs or EMGs during the procedure.                                               Sharolyn Douglas, M.D.    MC/MEDQ  D:  06/16/2003  T:  06/16/2003  Job:  [id number]

## 2011-02-15 NOTE — Procedures (Signed)
[facility]  Patient:    Jason [name], Jason [name]                       MRN: [id number] Proc. Date: 03/27/00 Adm. Date:  [id number] Attending:  Thyra Breed CC:         Julio Sicks, M.D.                           Procedure Report  PROCEDURE:  Lumbar epidural steroid injection.  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  INTERVAL HISTORY:  The patient has noted overall improvement to a moderate extent.  He had no untoward effects to his injection.  PHYSICAL EXAMINATION:  VITAL SIGNS:  Blood pressure is 140/77, heart rate 100, respiratory rate 10, O2 saturation 98%, pain level is 7 out of 10, and temperature is 98.1.  MUSCULOSKELETAL/NEUROLOGIC:  His back shows good healing from his previous injection site.  DESCRIPTION OF PROCEDURE:  After informed consent was obtained, the patient was placed in the sitting position and monitored.  His back was prepped with Betadine x 3.  A skin wheal was raised at the L4-5 interspace with 1% lidocaine.  A 20 gauge Tuohy needle was introduced to the lumbar epidural space to loss of resistance to preservative-free normal saline.  There was no CSF nor blood.  Medrol 120 ml in 10 ml of preservative-free normal saline was gently injected.  The needle was flushed with preservative-free normal saline and removed intact.  POSTPROCEDURE CONDITION:  Stable.  DISCHARGE INSTRUCTIONS: 1. Resume previous diet. 2. Limitation on activities per instruction sheet. 3. Continue on current medications. 4. Follow up with me next week for final injection. DD:  03/27/00 TD:  03/28/00 Job: [id number]

## 2011-02-15 NOTE — Op Note (Signed)
[place]  Patient:    Jason [name], Jason [name]                       MRN: [id number] Proc. Date: 06/09/00 Adm. 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  OPERATION PERFORMED:  Re-exploration of L4-5 laminotomy, bilateral.  Bilateral L4-5 microdiskectomy.  Posterior lumbar interbody fusion utilizing tangent wedges and local autograft.  Posterolateral fusion utilizing pedicle screw fixation and local autograft.  Microdissection.  SURGEON:  Julio Sicks, M.D.  ASSISTANT:  Reinaldo Meeker, M.D.  ANESTHESIA:  General endotracheal.  INDICATIONS FOR PROCEDURE:  Jason [name] is a 45 year old male with history of chronic back and left lower extremity pain, consistent with a left-sided L5 radiculopathy.  The patient is status post a previous decompressive left-sided L4-5 laminotomy and microdiskectomy.  The patient has recurrent symptoms after an intervening period of good results.  MRI scanning and CT myelography demonstrate evidence of recurrent stenosis with some degree of a leftward disk bulge, possibly a recurrent disk herniation causing compression of the left-sided L5 nerve root and also compression of the left-sided L4 nerve root. The patient has been counseled as to his options.  It is not felt that simple redo microdiskectomy would be of benefit.  The patient presents now for extensive decompression and fusion surgery.  DESCRIPTION OF PROCEDURE:  The patient was taken to the operating room and placed on the table in supine position.  An adequate level of anesthesia was achieved, the patient was positioned prone onto a Wilson frame, appropriately padded.  Patients lumbar region was shaved and prepped sterilely.  A 10 blade was used to make a  linear skin incision overlying the L3, 4 and 5 levels. This was carried down sharply in the midline.  Subperiosteal dissections were performed bilaterally exposing the lamina and facet joints of L4 and L5 as well as the transverse processes of L4 and L5.  Deep self-retaining retractor was placed.  Intraoperative fluoroscopy was used and the level was confirmed. Decompressive laminectomy was then performed at L4 and L5 using Leksell rongeurs, Kerrison rongeur and a high speed drill.  The previous laminotomy site was dissected free and the underlying scar resected.  After a very wide decompression had been achieved, both L4 and L5 nerve roots were widely decompressed.  The microscope was brought into the field and used for microdissection.  Starting first on the patients right side.  The epidural venous plexus was coagulated and cut.  The thecal sac and L5 nerve root were mobilized and retracted towards the midline.  Disk space was then incised with a 15 blade in rectangular fashion.  A wide disk space clean out was then achieved using pituitary rongeurs, upward angled pituitary rongeurs and Epstein curets.  The disk space was then distracted to 10 mm.  Attention was then placed to the contralateral side.  Once again the remaining aspects of epidural scar were resected.  The thecal sac and L4 nerve root were gradually mobilized towards the midline.  The disk space was then incised with a 15 blade.  The disk space was cleaned out using pituitary rongeurs, upward angled pituitary rongeurs and Epstein  curets.  Although there was a broad based bulge, there was no frank recurring disk herniation at this level.  The L5 nerve root was further decompressed into its foramen.  At this point generous foraminotomies had been performed at all four locations.  The disk space was then subsequently distracted to 11 mm.  A 10 mm distractor was removed.  The microscope was removed.  Using fluoroscopic guidance,  tangent wedges were then placed in a following manner.  With the thecal sac and nerve roots protected, the disk space was then reamed and then cut with a 10 mm chisel down to an appropriate depth.  A 10 blade 26 mm tangent wedge was then impacted into place.  Retraction system and wedge applier was removed.  Intraoperative fluoroscopy revealed good position of the bone graft, proper operative level with normal alignment of the spine.  The procedure was then repeated on the contralateral side again without complications.  Prior to placing the second wedge, morselized autograft was packed into the innerspace.  Once again, the wedges were found to be in good position.  There was no evidence of injury to thecal sac or nerve roots.  Attention was then placed to placement of pedicle screw instrumentation.  The pedicles at L4 and L5 were isolated bilaterally. ____________ bone was removed using high speed drill.  Pedicles were then probed using a pedicle awl under fluoroscopic guidance.  All tracts were found to be solidly within bone.  All tracts were then tapped using 5.25 mm screw tap.  Once again this was found to be solidly within bone.  6.75 x 45 mm SDRS variable angled pedicle screws were then placed in all four locations.  All were given final tightenings and found to be solid within bone.  A short segment of titanium rod was contoured appropriately and placed over the screw heads at L4 and L5.  The locking caps were then engaged and then sequentially tightened to place the construct under compression.  At this point the spinal canal was inspected.  The L4 and L5 foramina were widely decompressed.   There was no evidence of injury to thecal sac or nerve roots.  Final images revealed good positioning of the bone graft and hardware at the proper operative level with normal alignment of the spine.  The transverse processes of L4 and L5 were then decorticated using high speed drill.  Morselized  autograft was packed posterolaterally.  The wound was irrigated one final time.  The spinal canal was inspected one final time.  Gelfoam was placed topically for  hemostasis.  A medium Hemovac drain was left in the epidural space.  The wound was then closed in layers with Vicryl sutures.  Staples were applied to the surface.  There were no apparent complications.   The patient tolerated the procedure well and he returned to the recovery room postoperatively. DD:  06/09/00 TD:  06/09/00 Job: [id number]
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
## 2011-02-15 NOTE — Op Note (Signed)
NAME:  ASKARI, KINLEY                          ACCOUNT NO.:  [id number]   MEDICAL RECORD NO.:  [id number]                   PATIENT TYPE:  INP   LOCATION:  2550                                 FACILITY:  [facility]   PHYSICIAN:  Sharolyn Douglas, M.D.                     DATE OF BIRTH:  [birth year]   DATE OF PROCEDURE:  04/18/2004  DATE OF DISCHARGE:                                 OPERATIVE REPORT   PREOPERATIVE DIAGNOSES:  1. Spinal stenosis.  2. 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   POSTOPERATIVE DIAGNOSES:  1. Spinal stenosis.  2. 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   PROCEDURE:  1. Exploration of L4-5 fusion and removal of instrumentation.  2. Revision lumbar laminectomy L3-4 with wide decompression of the nerve     roots and thecal sacs bilaterally.  3. Transforaminal lumbar interbody fusion L3-4.  4. Placement of a 14 mm PEEK prosthetic cage, packed with bone morphogenic     protein.  5. Posterior spinal arthrodesis L3-4.  6. Pedicle screw instrumentation L3-4 using the spinal Concepts system.  7. Local autogenous bone graft.  8. Neural monitoring with testing of 4 pedicle screws with triggered     electromyelograms and monitoring of free-running EMGs x3 hours.   SURGEON:  Sharolyn Douglas, M.D.   ASSISTANT:  Verlin Fester, P.A.   ANESTHESIA:  General endotracheal.   COMPLICATIONS:  None.   INDICATIONS FOR PROCEDURE:  The patient is a pleasant, 45 year old male who  is status post previous L4-5 fusion by Dr. Jordan Likes June 10, 2001.  He has  had persistent, worsening back and bilateral lower extremities pain. CT  myelogram shows severe spinal stenosis above the previous fusion, with  impingement of hardware on the L3-4 facet complex.  Because of his  persistent symptoms unresponsive to all conservative measures, he has  elected to undergo revision, decompression and extension of the fusion  across the L3-4 segment.  He knows the risks  and benefits.   DESCRIPTION OF PROCEDURE:  The patient was promptly identified in the  holding area, taken to the operating room, underwent general endotracheal  anesthesia without difficulty, given prophylactic IV antibiotics, carefully  turned prone onto the Wilson frame.  All bony prominences were padded, face  and eyes protected at all times, back prepped and draped in the usual  sterile fashion.   The previous midline incision was utilized.  Dissection was carried through  the previous scar down to the spinous processes of L2 and L3. The L4 spinous  process had been removed.  The previous hardware was exposed.  The superior  portion of the screw rod complex appeared to be impinging upon the L3-4  facet joint, which was hypertrophied bilaterally.  The pedicle screws were  removed using the appropriate instrumentation. The screws themselves were  well fixed.   We explored the posterolateral fusion mass at  L4 and L5 and it appeared to  be solid. There was no motion at L4-5.   We then turned our attention to exposing the transverse processes of L3.  We  turned our attention to completing revision laminectomy by finding the  previous laminectomy edge below the L3 lamina.  Using curets, the epidural  fibrosis was carefully dissected free from the bony edge. High speed bur,  Kerrison punches used to complete a laminectomy removing the inferior half  of the L3 spinal arch. Care was taken to preserve the superior portion of  the L3 spinous process and the L2-3 interspinous ligament. We completed a  central laminectomy, we found the ligamentum flavum hypertrophied.  Lateral  recess decompression was carried out by undercutting the superior facets of  L4.   We then turned our attention to completing a transforaminal lumbar interbody  fusion at L3-4 on the left side. The inferior facet of L3 was osteotomized  through the pars intra-articularis.  The superior facet of L4 was cut flush  with  the pedicle.  This created the transforaminal window. We identified the  exiting L3 and transversing L4 nerve roots.  The thecal sac was gently  mobilized medially.  The nerve roots were protected at all times. Free  running EMGs were monitored. There were no deleterious changes.  Sharp  annulotomy completed radical diskectomy carried out to the contralateral  side using special TLIF instruments.  The cartilaginous endplates were  scraped. We dilated the disk space up to 14 mm. We then packed a BMP sponge  from a small, infused set into the disk space and anteriorly.  Local  autogenous bone graft collected from the facetectomy was morcellized and  also packed into the interspace.  We then inserted a 14 mm PEEK prosthetic  cage into the interspace and carefully tamped it anteriorly and across the  midline.  Bleeding was controlled with bipolar electrocautery and Gelfoam.   We then turned our attention to placing pedicle screws at L3 and L4  bilaterally.  Using anatomic probing technique at L3, by identifying the  pedicle starting point.  The awl was used to imitate the pedicle hole. The  pedicle probe was then used to cannulate the pedicle. Each pedicle was then  palpated using a ball-tip feeler; there were no breaches.  We tapped the  pedicle and then placed 6.5 x 50 mm screws at L3. We utilized the previous  screws at L4 by tapping with a 7 mm tap and then placing 7.5 mm screws. We  had very good purchase of the screws. Each screw was then stimulated using  triggered EMGs. We had a response at greater than 20 milliamps consistent  with intra-osseous placement.   We then proceded to perform a posterior spinal arthrodesis at L3 and 4 by  using a high speed bur to decorticate the transverse process of L3 as well  as the previous fusion mass at L4.  The remaining local bone graft was then carefully packed into the lateral gutters.  Short segment titanium rods were  placed into the polyaxial  screw heads. Before shearing off the locking caps,  gentle compression was applied across the L3-4 segment. Intraoperative x-  rays showed acceptable positioning of the implants.  The wound was  irrigated.  Gelfoam was left over the exposed epidural space, deep Hemovac  drain placed.   Fascia was closed with a running #1 Vicryl suture, subcutaneous layer closed  with 2-0 Vicryl suture followed by a  running 3-0 nylon suture to approximate  the skin edges. Sterile dressing was applied. The patient was turned supine,  extubated without difficulty and transferred to the recovery room in stable  condition.  He was able to move his upper and lower extremities.                                               Sharolyn Douglas, M.D.   MC/MEDQ  D:  04/18/2004  T:  04/18/2004  Job:  [id number]

## 2011-02-15 NOTE — Discharge Summary (Signed)
NAME:  Jason [name], Jason [name]                          ACCOUNT NO.:  [id number]   MEDICAL RECORD NO.:  [id number]                   PATIENT TYPE:  INP   LOCATION:  5029                                 FACILITY:  [facility]   PHYSICIAN:  Sharolyn Douglas, M.D.                     DATE OF BIRTH:  [birth year]   DATE OF ADMISSION:  04/18/2004  DATE OF DISCHARGE:  04/21/2004                                 DISCHARGE SUMMARY   ADMISSION DIAGNOSES:  1.  Adjacent segment disease, spinal stenosis and degenerative disk disease.  2.  Hypertension.  3.  Gastroesophageal reflux disease.  4.  History of colitis.   DISCHARGE DIAGNOSES:  1.  Status post removal of hardware L4-5 and posterior spinal fusion L3-4.  2.  Postoperative fever, resolved prior to discharge.  3.  Postoperative hemorrhagic anemia that was mild, asymptomatic and did not      require transfusion.   PROCEDURE:  On April 18, 2004 the patient was taken to the operating room for  removal of hardware L4-5, laminectomy and posterior spinal fusion at  L3-4  with pedicle screws.  This was also done by Dr. Sharolyn Douglas, assistant [facility], [facility]C.  Anesthesia used was general.   CONSULTATIONS:  None.   LABORATORY DATA:  Preoperatively, CBC with diff was within normal limits.  H&H right after postop dropped to 11.5 and 33.3 on April 21, 2004.  PT, INR  and PTT preop normal.  Complete metabolic panel preop was normal and basic  metabolic panel taken postop day #1 showed a slight hyponatremia of 134 and  hyperglycemia at 144.  UA preop was negative.  Blood typing preop was type  O, Rh type negative, antibody screen negative.   X-RAYS:  On July 20 portable used intraoperatively for localization and  postoperatively showed posterior lateral and interbody fusion at L3-4 and  interbody fusion at L4-5.  No EKG on the chart.   BRIEF HISTORY:  The patient is a 45 year old male who has been seen by Dr.  Noel Gerold for a long time for continuing problems  considering his low back.  He  has chronic back pain radiating into his lower extremities, left greater  than right, interfering with day-to-day activities and constant pain.  He  had previous laminectomy done at L4-5.  He has tried numerous methods of  conservative management including injections, pain medications and physical  therapy, activity modification.  Unfortunately, his pain continued to  progressively get worse.  He is found to have severe spinal stenosis  ___________ management secondary to his MRI, CT and physical exam findings  as well as failure to improve with conservative measures with the posterior  spinal fusion at the adjacent segment.  Risks and benefits of the procedure  were discussed with the patient by Dr. Noel Gerold as well as myself and he  indicated understanding and opted to proceed.  HOSPITAL COURSE:  On April 18, 2004 the patient was taken to the operating  room for the above listed procedure.  He tolerated the procedure well  without any intraoperative complications and was transferred to the recovery  room in stable condition.  There was one Hemovac drain placed  intraoperatively.  There was approximately 525 mL of blood loss with 225  returned via [employer].  Postoperatively, routine orthopedic spine protocol  was followed and he progressed along well with physical therapy.  Antibiotics were completed x48 hours.  SBDs and Ted hose as well as early  mobilization for DVT prophylaxis.  Diet was held until flatus at which time  it was slowly advanced.  By April 21, 2004 the patient had met all orthopedic  goals.  He was stable medically and ready for discharge.  He was independent  with ambulation and __________.   DISCHARGE PLAN:  The patient is a 44 year old male status post revision of  posterior spinal fusion of adjacent segment doing well.   ACTIVITY:  Daily ambulation, __________. Dressing changes daily.  Keep  incision dry x5 days.  Back precautions.  No  lifting greater than 5 pounds.  Followup in two weeks with Dr. Noel Gerold.   DIET:  Regular home diet.   MEDICATIONS:  1.  Percocet.  2.  Robaxin.  3.  Multivitamin daily.  4.  Calcium daily.  5.  Over-the-counter laxative as needed.  6.  Avoid NSAIDs x3 months.   CONDITION ON DISCHARGE:  Stable, improved.   DISPOSITION:  The patient being discharged to his home with home health  physical therapy through [place] and his family's assistance.      Verlin Fester, P.A.                       Sharolyn Douglas, M.D.    CM/MEDQ  D:  05/30/2004  T:  05/30/2004  Job:  [id number]

## 2011-02-15 NOTE — Procedures (Signed)
[facility]  Patient:    Jason [name], Jason [name]                       MRN: [id number] Proc. Date: 04/10/00 Adm. Date:  [id number] Attending:  Thyra Breed CC:         Julio Sicks, M.D.                           Procedure Report  PROCEDURE:  Lumbar epidural steroid injection.  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  INTERVAL HISTORY:  The patient notes moderate improvement at best which is not tending to stay as intense between shots. He stills wants to go ahead with another injection. His left leg feels markedly improved.  PHYSICAL EXAMINATION:  VITAL SIGNS:  Blood pressure 146/77, heart rate 95, respiratory rate 16, O2 saturations 97%, pain level is 7/10 and temperature is 98.1.  BACK:  Shows good healing from previous injection site.  DESCRIPTION OF PROCEDURE:  After informed consent was obtained, the patient was placed in the sitting position and monitored. The patients back was prepped with Betadine x 3. A skin wheal was raised at the L3-4 interspace with 1 percent lidocaine. A 20 gauge Tuohy needle was introduced to the lumbar epidural space to loss of resistance to preservative free normal saline. There was no cerebrospinal fluid nor blood. 120 mg of Medrol and 10 ml of preservative free normal saline was gently injected. The needle was flushed with preservative free normal saline and removed intact.  CONDITION POST PROCEDURE:  Stable.  DISCHARGE INSTRUCTIONS:  Resume previous diet. Limitations in activities per instruction sheet. Continue on current medications. The patient plans to follow-up with Dr. Jordan Likes. DD:  04/10/00 TD:  04/10/00 Job: [id number]

## 2011-02-15 NOTE — H&P (Signed)
NAME:  Jason [name], Jason [name]                          ACCOUNT NO.:  [id number]   MEDICAL RECORD NO.:  [id number]                   PATIENT TYPE:  OIB   LOCATION:  5022                                 FACILITY:  [facility]   PHYSICIAN:  Sharolyn Douglas, M.D.                     DATE OF BIRTH:  [birth year]   DATE OF ADMISSION:  06/16/2003  DATE OF DISCHARGE:                                HISTORY & PHYSICAL   CHIEF COMPLAINT:  Neck and right upper extremity pain.   HISTORY OF PRESENT ILLNESS:  The patient is a 45 year old male who has been  having neck and right upper extremity pain that has been getting  progressively worse.  He reports the pain being in his neck as well as going  down his right upper extremity and C6 distribution.  He has tried numerous  methods of conservative management including physical therapy, activity  modification, anti-inflammatory medications, narcotic pain medications at  this time and unfortunately has not seen any improvement with any of these.  Secondary to the severity of his symptoms, also decreasing strength and MRI  findings, it was found that the best course of management would be an  anterior cervical discectomy and fusion.  The risks and benefits of this  were discussed with the patient by Dr. Noel Gerold as well as myself and indicated  understanding and opted to proceed.   ALLERGIES:  VICODIN causes tachycardia.   MEDICATIONS:  1. Benicar HCT 40 mg p.o. daily.  2. Nexium daily.  3. Percocet p.r.n.  4. Lexapro 20 mg p.o. daily.  5. Wellbutrin XL 150 mg p.o. daily.   PAST MEDICAL HISTORY:  1. Depression.  2. Hypertension.  3. GERD.   PAST SURGICAL HISTORY:  1. Appendectomy in 1999.  2. Lumbar microdiscectomy in 2000.  3. Lumbar fusion in 2001.   SOCIAL HISTORY:  The patient denies tobacco use and denies alcohol use.  He  is separated.  He has two children, ages 68 and 42.   FAMILY HISTORY:  Father deceased at 36 secondary to an MI.  Mother alive at  age  85 with coronary artery disease, hypertension and history of stroke.  Brother deceased at age 47 secondary to leukemia.   REVIEW OF SYMPTOMS:  The patient denies fevers, chills, sweats, bleeding  tendencies.  He denies blurry vision, double vision, seizures, headache,  paralysis.  CARDIOVASCULAR:  Denies chest pain, angina, orthopnea,  claudications or palpitations.  PULMONARY:  Denies shortness of breath,  productive cough or hemoptysis.  GI:  Denies nausea and vomiting.  Does have  constipation secondary to his pain medications.  These will be managed with  over-the-counter medications.  Denies diarrhea, melena or bloody stool.  GU:  Denies dysuria, hematuria or discharge.  MUSCULOSKELETAL:  As per HPI.   PHYSICAL EXAMINATION:  GENERAL APPEARANCE:  The patient is a 45 year old  male who is  alert and oriented and in no acute distress.  He is well-  nourished, well-groomed, appearing stated age, pleasant and cooperative to  examination.  VITAL SIGNS:  Vital signs 126/86, respiratory rate 16 and nonlabored, pulses  100 and regular.  HEENT:  Head is normocephalic and atraumatic.  Pupils are equal, round and  reactive.  Extraocular movements intact.  Nose patent.  Pharynx clear.  NECK:  Soft to palpation, no lymphadenopathy, thyromegaly or bruits  appreciated.  CHEST:  Clear to auscultation bilaterally.  No wheezing, rhonchi, or  stridor.  No friction rubs.  BREASTS:  Not pertinent and not performed.  CARDIOVASCULAR:  S1 and S2 regular rate and rhythm with no murmurs, rubs, or  gallops noted.  ABDOMEN:  Soft to palpation with positive bowel sounds.  Nontender and  nondistended with no organomegaly noted.  GU:  Not pertinent and not performed.  EXTREMITIES:  The patient has right upper extremity pain.  Pulses are intact  and symmetric.  Motor function and sensation are grossly intact.  He does  have some weakness and redness compared to the left upper extremity.  SKIN:  Intact without  any lesions or rashes.   X-ray and MRI show cervical spondylosis.   IMPRESSION:  1. Cervical spondylitic radiculopathy.  2. Depression.  3. Hypertension.  4. Gastroesophageal reflux disease.   PLAN:  1. Admit to [facility] on [facility] 16, 2003, for an     ACD at C4-5.  This will be done by Dr. Sharolyn Douglas.  2. The patient's primary care physician is Dr. Posey Rea.  If we have any     medical issues or problems around surgery, we will certainly call him for     assistance.      Verlin Fester, P.A.                       Sharolyn Douglas, M.D.    CM/MEDQ  D:  06/16/2003  T:  06/16/2003  Job:  [id number]

## 2011-02-19 ENCOUNTER — Encounter (INDEPENDENT_AMBULATORY_CARE_PROVIDER_SITE_OTHER): Payer: Self-pay | Admitting: Surgery

## 2011-03-14 NOTE — Discharge Summary (Signed)
  Jason [name], Jason [name]                ACCOUNT NO.:  [id number]  MEDICAL RECORD NO.:  [id number]           PATIENT TYPE:  I  LOCATION:  1539                         FACILITY:  [facility]  PHYSICIAN:  Abigail Miyamoto, M.D. DATE OF BIRTH:  [birth year]  DATE OF ADMISSION:  02/04/2011 DATE OF DISCHARGE:  02/09/2011                              DISCHARGE SUMMARY   SUMMARY OF HISTORY:  This is a 45 year old gentleman who had undergone a laparoscopic ventral hernia repair with mesh.  He did well initially but then started having nausea, vomiting and constipation and he presented to emergency room.  He had a CAT scan of the abdomen and pelvis which showed signs of ileus versus partial small-bowel obstruction.  Decision was made to admit the patient to hospital.  HOSPITAL COURSE:  The patient was admitted, started on bowel rest, and IV rehydration.  In the first 24 hours, his pain had decreased significantly.  He had nasogastric tube which was removed on the second hospital day.  His NG tube was removed on second hospital day. Abdominal x-rays demonstrated findings consistent with an ileus.  He was started on pulmonary toilet.  At this point his white blood count was normal.  Over the next several days, his ileus slowly resolved and by Feb 08, 2011, he was passing flatus, moving his bowels.  His abdomen was soft.  He was tolerated a diet and decision made to discharge the patient to home.  DISCHARGE DIET:  Soft.  DISCHARGE ACTIVITIES:  Do no heavy lifting.  He may shower.  DISCHARGE FOLLOWUP:  He will follow up with myself at [facility] in 1 week post discharge.     Abigail Miyamoto, M.D.     DB/MEDQ  D:  02/22/2011  T:  02/23/2011  Job:  [id number]  Electronically Signed by Abigail Miyamoto M.D. on 03/14/2011 07:04:59 AM

## 2011-07-11 LAB — BASIC METABOLIC PANEL
BUN: 10
Calcium: 8.5
Calcium: 9.2
Chloride: 105
Chloride: 107
Creatinine, Ser: 1.11
Creatinine, Ser: 1.39
GFR calc Af Amer: >60

## 2011-07-11 LAB — CBC
MCHC: 34.4
MCV: 84.8
Platelets: 285
RBC: 4.16 — ABNORMAL LOW
WBC: 6
WBC: 6

## 2011-07-12 LAB — BASIC METABOLIC PANEL
BUN: 10
CO2: 29
CO2: 29
Calcium: 9.1
Calcium: 9.2
Creatinine, Ser: 1.09
Creatinine, Ser: 1.16
GFR calc Af Amer: >60
GFR calc Af Amer: >60
Glucose, Bld: 108 — ABNORMAL HIGH

## 2011-07-12 LAB — CLOSTRIDIUM DIFFICILE EIA: C difficile Toxins A+B, EIA: NEGATIVE

## 2011-07-12 LAB — COMPREHENSIVE METABOLIC PANEL
ALT: 25
AST: 35
CO2: 25
Chloride: 99
GFR calc Af Amer: >60
GFR calc non Af Amer: >60
Sodium: 133 — ABNORMAL LOW
Total Bilirubin: 0.5

## 2011-07-12 LAB — OCCULT BLOOD X 1 CARD TO LAB, STOOL: Fecal Occult Bld: NEGATIVE

## 2011-07-12 LAB — CARDIAC PANEL(CRET KIN+CKTOT+MB+TROPI)
Relative Index: INVALID
Troponin I: 0.02

## 2011-07-12 LAB — CBC
MCHC: 34.2
Platelets: 262
RBC: 4.4
WBC: 5.7

## 2011-07-12 LAB — CULTURE, BLOOD (ROUTINE X 2)
Culture: NO GROWTH
Culture: NO GROWTH

## 2011-09-17 ENCOUNTER — Other Ambulatory Visit (INDEPENDENT_AMBULATORY_CARE_PROVIDER_SITE_OTHER): Payer: Managed Care, Other (non HMO)

## 2011-09-17 ENCOUNTER — Encounter: Payer: Self-pay | Admitting: Gastroenterology

## 2011-09-17 ENCOUNTER — Ambulatory Visit (INDEPENDENT_AMBULATORY_CARE_PROVIDER_SITE_OTHER): Payer: Managed Care, Other (non HMO) | Admitting: Gastroenterology

## 2011-09-17 DIAGNOSIS — R109 Unspecified abdominal pain: Secondary | ICD-10-CM

## 2011-09-17 DIAGNOSIS — Z8719 Personal history of other diseases of the digestive system: Secondary | ICD-10-CM

## 2011-09-17 DIAGNOSIS — R197 Diarrhea, unspecified: Secondary | ICD-10-CM

## 2011-09-17 DIAGNOSIS — Z9889 Other specified postprocedural states: Secondary | ICD-10-CM

## 2011-09-17 LAB — IBC PANEL
Iron: 119 ug/dL (ref 42–165)
Transferrin: 248.4 mg/dL (ref 212.0–360.0)

## 2011-09-17 LAB — FERRITIN: Ferritin: 67.1 ng/mL (ref 22.0–322.0)

## 2011-09-17 LAB — FOLATE: Folate: 17.6 ng/mL (ref >5.9)

## 2011-09-17 LAB — BASIC METABOLIC PANEL
BUN: 17 mg/dL (ref 6–23)
Chloride: 106 mEq/L (ref 96–112)
Creatinine, Ser: 1.2 mg/dL (ref 0.4–1.5)
GFR: 67.66 mL/min (ref >60.00)

## 2011-09-17 LAB — VITAMIN B12: Vitamin B-12: 299 pg/mL (ref 211–911)

## 2011-09-17 LAB — CBC WITH DIFFERENTIAL/PLATELET
Eosinophils Relative: 2.4 % (ref 0.0–5.0)
HCT: 42.7 % (ref 39.0–52.0)
Lymphs Abs: 1.9 10*3/uL (ref 0.7–4.0)
Monocytes Relative: 11.4 % (ref 3.0–12.0)
Platelets: 235 10*3/uL (ref 150.0–400.0)
WBC: 6 10*3/uL (ref 4.5–10.5)

## 2011-09-17 LAB — HEPATIC FUNCTION PANEL
AST: 36 U/L (ref 0–37)
Albumin: 4.5 g/dL (ref 3.5–5.2)

## 2011-09-17 LAB — TSH: TSH: 0.97 u[IU]/mL (ref 0.35–5.50)

## 2011-09-17 LAB — LIPASE: Lipase: 37 U/L (ref 11.0–59.0)

## 2011-09-17 MED ORDER — PEG-KCL-NACL-NASULF-NA ASC-C 100 G PO SOLR: 1.0000 | Freq: Once | ORAL | Status: DC

## 2011-09-17 MED ORDER — CILIDINIUM-CHLORDIAZEPOXIDE 2.5-5 MG PO CAPS: 1.0000 | ORAL_CAPSULE | Freq: Three times a day (TID) | ORAL | Status: DC

## 2011-09-17 NOTE — Patient Instructions (Signed)
Your procedure has been scheduled for 09/20/2011, please follow the seperate instructions.  Please go to the basement today for your labs.  Your prescription(s) have been sent to you pharmacy. Follow the low fiber diet below.   Low Fiber and Residue Restricted Diet A low fiber diet restricts foods that contain carbohydrates that are not digested in the small intestine. A diet containing about 10 g of fiber is considered low fiber. The diet needs to be individualized to suit patient tolerances and preferences and to avoid unnecessary restrictions. Generally, the foods emphasized in a low fiber diet have no skins or seeds. They may have been processed to remove bran, germ, or husks. Cooking may not necessarily eliminate the fiber. Cooking may, in fact, enable a greater quantity of fiber to be consumed in a lesser volume. Legumes and nuts are also restricted. The term low residue has also been used to describe low fiber diets, although the two are not the same. Residue refers to any substance that adds to bowel (colonic) contents, such as sloughed cells and intestinal bacteria, in addition to fiber. Residue-containing foods, prunes and prune juice, milk, and connective tissue from meats may also need to be eliminated. It is important to eliminate these foods during sudden (acute) attacks of inflammatory bowel disease, when there is a partial obstruction due to another reason, or when minimal fecal output is desired. When these problems are gone, a more normal diet may be used. PURPOSE  Prevent blockage of a partially obstructed or narrowed gastrointestinal tract.   Reduce stool weight and volume.   Slow the movement of waste.  WHEN IS THIS DIET USED?  Acute phase of Crohn's disease, ulcerative colitis, regional enteritis, or diverticulitis.   Narrowing (stenosis) of intestinal or esophageal tubes (lumina).   Transitional diet following surgery, injury (trauma), or illness.  ADEQUACY This diet is  nutritionally adequate based on individual food choices according to the Recommended Dietary Allowances of the [employer]. CHOOSING FOODS Check labels, especially on foods from the starch list. Often, dietary fiber content is listed with the Nutrition Facts panel.  Breads and Starches  Allowed: White, [place], and pita breads, plain rolls, buns, or sweet rolls, doughnuts, waffles, pancakes, bagels. Plain muffins, sweet breads, biscuits, matzoth. Flour. Soda, saltine, or graham crackers. Pretzels, rusks, melba toast, zwieback. Cooked cereals: cornmeal, farina, cream cereals. Dry cereals: refined corn, wheat, [place], and oat cereals (check label). Potatoes prepared any way without skins, refined macaroni, spaghetti, noodles, refined [place].   Avoid: Bread, rolls, or crackers made with whole-wheat, multigrains, rye, bran seeds, nuts, or coconut. Corn tortillas, table-shells. Corn chips, tortilla chips. Cereals containing whole-grains, multigrains, bran, coconut, nuts, or raisins. Cooked or dry oatmeal. Coarse wheat cereals, granola. Cereals advertised as "high fiber." Potato skins. Whole-grain pasta, wild or brown [place]. Popcorn.  Vegetables  Allowed:  Strained tomato and vegetable juices. Fresh: tender lettuce, cucumber, cabbage, spinach, bean sprouts. Cooked, canned: asparagus, bean sprouts, cut green or wax beans, cauliflower, pumpkin, beets, mushrooms, olives, spinach, yellow squash, tomato, tomato sauce (no seeds), zucchini (peeled), turnips. Canned sweet potatoes. Small amounts of celery, onion, radish, and green pepper may be used. Keep servings limited to  cup.   Avoid: Fresh, cooked, or canned: artichokes, baked beans, beet greens, broccoli, Brussels sprouts, French-style green beans, corn, kale, legumes, peas, sweet potatoes. Cooked: green or red cabbage, spinach. Avoid large servings of any vegetables.  Fruit  Allowed:  All fruit juices except prune juice. Cooked or canned: apricots  applesauce,  cantaloupe, cherries, grapefruit, grapes, kiwi, mandarin oranges, peaches, pears, fruit cocktail, pineapple, plums, watermelon. Fresh: banana, grapes, cantaloupe, avocado, cherries, pineapple, grapefruit, kiwi, nectarines, peaches, oranges, blueberries, plums. Keep servings limited to  cup or 1 piece.   Avoid: Fresh: apple with or without skin, apricots, mango, pears, raspberries, strawberries. Prune juice, stewed or dried prunes. Dried fruits, raisins, dates. Avoid large servings of all fresh fruits.  Meat and Meat Substitutes  Allowed:  Ground or well-cooked tender beef, ham, veal, lamb, pork, or poultry. Eggs, plain cheese. Fish, oysters, shrimp, lobster, other seafood. Liver, organ meats.   Avoid: Tough, fibrous meats with gristle. Peanut butter, smooth or chunky. Cheese with seeds, nuts, or other foods not allowed. Nuts, seeds, legumes, dried peas, beans, lentils.  Milk  Allowed:  All milk products except those not allowed. Milk and milk product consumption should be minimal when low residue is desired.   Avoid: Yogurt that contains nuts or seeds.  Soups and Combination Foods  Allowed:  Bouillon, broth, or cream soups made from allowed foods. Any strained soup. Casseroles or mixed dishes made with allowed foods.   Avoid: Soups made from vegetables that are not allowed or that contain other foods not allowed.  Desserts and Sweets  Allowed:  Plain cakes and cookies, pie made with allowed fruit, pudding, custard, cream pie. Gelatin, fruit, ice, sherbet, frozen ice pops. Ice cream, ice milk without nuts. Plain hard candy, honey, jelly, molasses, syrup, sugar, chocolate syrup, gumdrops, marshmallows.   Avoid: Desserts, cookies, or candies that contain nuts, peanut butter, or dried fruits. Jams, preserves with seeds, marmalade.  Fats and Oils  Allowed:  Margarine, butter, cream, mayonnaise, salad oils, plain salad dressings made from allowed foods. Plain gravy, crisp bacon  without rind.   Avoid: Seeds, nuts, olives. Avocados.  Beverages  Allowed:  All, except those listed to avoid.   Avoid: Fruit juices with high pulp, prune juice.  Condiments  Allowed:  Ketchup, mustard, horseradish, vinegar, cream sauce, cheese sauce, cocoa powder. Spices in moderation: allspice, basil, bay leaves, celery powder or leaves, cinnamon, cumin powder, curry powder, ginger, mace, marjoram, onion or garlic powder, oregano, paprika, parsley flakes, ground pepper, rosemary, sage, savory, tarragon, thyme, turmeric.   Avoid: Coconut, pickles.  SAMPLE MEAL PLAN The following menu is provided as a sample. Your daily menu plans will vary. Be sure to include a minimum of the following each day in order to provide essential nutrients for the adult:  Starch/Bread/Cereal Group, 6 servings.   Fruit/Vegetable Group, 5 servings.   Meat/Meat Substitute Group, 2 servings.   Milk/Milk Substitute Group, 2 servings.  A serving is equal to  cup for fruits, vegetables, and cooked cereals or 1 piece for foods such as a piece of bread, 1 orange, or 1 apple. For dry cereals and crackers, use serving sizes listed on the label. Combination foods may count as full or partial servings from various food groups. Fats, desserts, and sweets may be added to the meal plan after the requirements for essential nutrients are met. SAMPLE MENU Breakfast   cup orange juice.   1 boiled egg.   1 slice white toast.   Margarine.    cup cornflakes.   1 cup milk.   Beverage.  Lunch   cup chicken noodle soup.   2 to 3 oz sliced roast beef.   2 slices seedless rye bread.   Mayonnaise.    cup tomato juice.   1 small banana.   Beverage.  Dinner  3  oz baked chicken.    cup scalloped potatoes.    cup cooked beets.   White dinner roll.   Margarine.    cup canned peaches.   Beverage.  Document Released: 03/08/2002 Document Revised: 05/29/2011 Document Reviewed: 01/30/2009 [facility]  Patient Information 2012 [place], [place].

## 2011-09-17 NOTE — Progress Notes (Signed)
This is a 45 year old Caucasian male with chronic GI complaints felt secondary in the past for IBS. He actually had repair of an umbilical hernia this past spring. and had resultant small bowel obstruction which required hospitalization for one week. Since that time he has been in" constant pain" in the periumbilical area radiating into his lower abdominal quadrants. This pain has no real alleviating or precipitating elements, but is associated with gas and bloating, alternating diarrhea and constipation, anorexia and nausea. He complains of early satiety, but no vomiting, hematemesis, melena or hematochezia. He has not had previous hepatitis or pancreatitis or cholelithiasis. He gives a history of tachycardia associated with narcotic use. Previous endoscopies and colonoscopies have been unremarkable. The patient denies frequent antibiotic use. He also denies use of NSAIDs, alcohol, or cigarettes.  Current Medications, Allergies, Past Medical History, Past Surgical History, Family History and Social History were reviewed in [employer] record.  Pertinent Review of Systems Negative   Physical Exam: Awake and alert no acute distress appearing his stated age. I cannot appreciate stigmata of chronic liver disease. Chest is clear and he is in a regular rhythm without murmurs gallops or rubs. There is no abdominal distention, organomegaly, masses or significant tenderness. Bowel sounds are nonobstructive. Inspection of rectum is unremarkable as is rectal exam. Stool is guaiac negative. Peripheral extremities unremarkable mental status is normal.      Assessment and Plan: Unusual presentation of chronic pain with a history of previous small bowel obstruction. I've elected to proceed with colonoscopy, but he may need small bowel CT enteroscopy. Diagnostic considerations would include inflammatory bowel disease, carcinoid syndrome, intestinal adhesions versus severe IBS. I placed him on  Librax 3 times a day and will schedule colonoscopy ASAP. He apparently cannot tolerate any analgesics. Repeat labs for review also ordered. Encounter Diagnoses  Name Primary?  . Abdominal pain   . Diarrhea   . S/P hernia surgery

## 2011-09-18 ENCOUNTER — Other Ambulatory Visit: Payer: Managed Care, Other (non HMO)

## 2011-09-18 DIAGNOSIS — R197 Diarrhea, unspecified: Secondary | ICD-10-CM

## 2011-09-20 ENCOUNTER — Encounter: Payer: Self-pay | Admitting: Gastroenterology

## 2011-09-20 ENCOUNTER — Other Ambulatory Visit: Payer: Self-pay | Admitting: Gastroenterology

## 2011-09-20 ENCOUNTER — Ambulatory Visit (AMBULATORY_SURGERY_CENTER): Payer: Managed Care, Other (non HMO) | Admitting: Gastroenterology

## 2011-09-20 VITALS — BP 131/73 | HR 76 | Temp 97.6°F | Resp 19 | Ht 71.0 in | Wt 208.0 lb

## 2011-09-20 DIAGNOSIS — R109 Unspecified abdominal pain: Secondary | ICD-10-CM

## 2011-09-20 DIAGNOSIS — K5730 Diverticulosis of large intestine without perforation or abscess without bleeding: Secondary | ICD-10-CM

## 2011-09-20 DIAGNOSIS — Z8719 Personal history of other diseases of the digestive system: Secondary | ICD-10-CM

## 2011-09-20 LAB — CLOSTRIDIUM DIFFICILE BY PCR: Toxigenic C. Difficile by PCR: NOT DETECTED

## 2011-09-20 MED ORDER — TRAMADOL HCL 50 MG PO TABS: 100.0000 mg | ORAL_TABLET | Freq: Four times a day (QID) | ORAL | Status: DC | PRN

## 2011-09-20 MED ORDER — SODIUM CHLORIDE 0.9 % IV SOLN: 500.0000 mL | INTRAVENOUS | Status: DC

## 2011-09-20 NOTE — Progress Notes (Signed)
Pt and care partner given ct instructions as per leisha for 09-25-11 at 1030 to arrive at 0930 to receive contrast. carepartner verbalized all understanding of all instructions. ewm  Patient did not experience any of the following events: a burn prior to discharge; a fall within the facility; wrong site/side/patient/procedure/implant event; or a hospital transfer or hospital admission upon discharge from the facility. [phone]) Patient did not have preoperative order for IV antibiotic SSI prophylaxis. [phone])

## 2011-09-20 NOTE — Progress Notes (Signed)
Instructions given to [facility] staff to give to pt for 12/26/201210:30am appt with 9:30am arrive at [facility] ct

## 2011-09-20 NOTE — Patient Instructions (Signed)
Handouts given on diverticulosis and high fiber diet  Discharge instructions per blue and green sheets  Dr Maris Berger office will schedule you a ct scan And call you with the date and time.

## 2011-09-20 NOTE — Op Note (Signed)
[facility] [address] [place], [place]  [address]  COLONOSCOPY PROCEDURE REPORT  PATIENT:  Jason [name], Jason [name]  MR#:  #[id number] BIRTHDATE:  [birth year], 45 yrs. old  GENDER:  male ENDOSCOPIST:  Vania Rea. Jarold Motto, MD, [facility] REF. BY: PROCEDURE DATE:  09/20/2011 PROCEDURE:  Diagnostic Colonoscopy ASA CLASS:  Class II INDICATIONS:  Abdominal pain MEDICATIONS:   Fentanyl 100 mcg IV, Versed 10 mg IV, These medications were titrated to patient response per physician's verbal order  DESCRIPTION OF PROCEDURE:   After the risks and benefits and of the procedure were explained, informed consent was obtained. Digital rectal exam was performed and revealed no abnormalities. The LB CF-[id number] endoscope was introduced through the anus and advanced to the cecum, which was identified by both the appendix and ileocecal valve.  The quality of the prep was excellent, using MoviPrep.  The instrument was then slowly withdrawn as the colon was fully examined. <<PROCEDUREIMAGES>>  FINDINGS:  There were mild diverticular changes in left colon. diverticulosis was found.  No polyps or cancers were seen. Otherwise normal colonoscopy without other polyps, masses, vascular ectasias, or inflammatory changes.   Retroflexed views in the rectum revealed no abnormalities.    The scope was then withdrawn from the patient and the procedure completed.  COMPLICATIONS:  None ENDOSCOPIC IMPRESSION: 1) Diverticulosis,mild,left sided diverticulosis 2) No polyps or cancers 3) Otherwise nl colonoscopy WMO RECOMMENDATIONS: f/u ct scan per hx. os small bowel obstruction.?? RECURRENT UMBILICAL OR INSISIONAL VENTRAL HERNIA.  REPEAT EXAM:  No  ______________________________ Vania Rea. Jarold Motto, MD, Clementeen Graham  CC:  Manus Rudd, MD  n. Rosalie DoctorMarland Kitchen   Vania Rea. Patterson at 09/20/2011 02:17 PM  Marcelyn Bruins, #[phone]

## 2011-09-23 ENCOUNTER — Telehealth: Payer: Self-pay | Admitting: *Deleted

## 2011-09-23 NOTE — Telephone Encounter (Signed)
LMOM

## 2011-09-25 ENCOUNTER — Ambulatory Visit (INDEPENDENT_AMBULATORY_CARE_PROVIDER_SITE_OTHER)
Admission: RE | Admit: 2011-09-25 | Discharge: 2011-09-25 | Disposition: A | Discharge status: Home / Self Care | Payer: Managed Care, Other (non HMO) | Source: Ambulatory Visit | Attending: Gastroenterology | Admitting: Gastroenterology

## 2011-09-25 DIAGNOSIS — Z8719 Personal history of other diseases of the digestive system: Secondary | ICD-10-CM

## 2011-09-25 DIAGNOSIS — R109 Unspecified abdominal pain: Secondary | ICD-10-CM

## 2011-09-25 MED ORDER — IOHEXOL 300 MG/ML  SOLN
100.0000 mL | Freq: Once | INTRAMUSCULAR | Status: AC | PRN
  Administered 2011-09-25: 100 mL via INTRAVENOUS

## 2011-10-02 ENCOUNTER — Telehealth: Payer: Self-pay | Admitting: *Deleted

## 2011-10-02 NOTE — Telephone Encounter (Signed)
Message copied by Daphine Deutscher on Wed Oct 02, 2011  4:39 PM ------      Message from: Jarold Motto, DAVID R      Created: Wed Oct 02, 2011 12:03 PM       He seems to have a symptomatic umbilical hernia which apparently has been repaired in the past. If he does not have a surgical appointment for reevaluation, this needs to be made. He has continued abdominal pain and a negative GI workup otherwise. Please send notes and colonoscopy and CT scan reports to referring doctor

## 2011-10-02 NOTE — Telephone Encounter (Signed)
Spoke with patient and gave him results and recommendations by Dr. Jarold Motto. He is scheduled with his surgeon already. Faxed colonoscopy and Ct to Dr. Meredith Staggers. Forwarded the CT to Dr. Neva Seat also.

## 2011-10-12 ENCOUNTER — Ambulatory Visit (INDEPENDENT_AMBULATORY_CARE_PROVIDER_SITE_OTHER): Payer: Managed Care, Other (non HMO)

## 2011-10-12 DIAGNOSIS — J111 Influenza due to unidentified influenza virus with other respiratory manifestations: Secondary | ICD-10-CM

## 2011-10-12 DIAGNOSIS — J209 Acute bronchitis, unspecified: Secondary | ICD-10-CM

## 2011-10-17 ENCOUNTER — Encounter (INDEPENDENT_AMBULATORY_CARE_PROVIDER_SITE_OTHER): Payer: Self-pay | Admitting: Surgery

## 2011-10-17 ENCOUNTER — Ambulatory Visit (INDEPENDENT_AMBULATORY_CARE_PROVIDER_SITE_OTHER): Payer: Managed Care, Other (non HMO) | Admitting: Surgery

## 2011-10-17 VITALS — BP 132/86 | HR 70 | Temp 98.1°F | Resp 18 | Ht 71.0 in | Wt 209.2 lb

## 2011-10-17 DIAGNOSIS — K432 Incisional hernia without obstruction or gangrene: Secondary | ICD-10-CM

## 2011-10-17 NOTE — Progress Notes (Signed)
Subjective:     Patient ID: Jason [name], male   DOB: 08/02/1966, 46 y.o.   MRN: [id number]  HPI This is a pleasant gentleman who I performed a laparoscopic ventral incisional hernia repair with mesh on in May of 2012. He has been having worsening abdominal pain as well as intermittent nausea and explicit diarrhea for several months. Most recently he had a CAT scan suggesting a possible recurrent hernia.  Review of Systems     Objective:   Physical Exam On examination, I cannot feel recurrent hernia. His incision at the umbilicus is mildly tender.  A CAT scan of the abdomen and pelvis shows a recurrent hernia at the umbilicus    Assessment:     Recurrent ventral hernia    Plan:     I discussed this with the patient in detail. I am recommending we attempt to repair this laparoscopic we again. I discussed this with him in detail. I discussed the risk of surgery which includes but is not limited to bleeding, infection, need for bowel resection, injury to the bowel, injury to other structures, need for further mesh, etc. He understands and wishes to proceed. Likelihood of success is good

## 2011-10-21 ENCOUNTER — Encounter (HOSPITAL_COMMUNITY): Payer: Self-pay | Admitting: Pharmacy Technician

## 2011-10-29 ENCOUNTER — Encounter (HOSPITAL_COMMUNITY)
Admission: RE | Admit: 2011-10-29 | Discharge: 2011-10-29 | Disposition: A | Discharge status: Home / Self Care | Payer: Managed Care, Other (non HMO) | Source: Ambulatory Visit | Attending: Surgery | Admitting: Surgery

## 2011-10-29 ENCOUNTER — Other Ambulatory Visit: Payer: Self-pay

## 2011-10-29 ENCOUNTER — Telehealth: Payer: Self-pay

## 2011-10-29 ENCOUNTER — Encounter (HOSPITAL_COMMUNITY): Payer: Self-pay

## 2011-10-29 HISTORY — DX: Other specified postprocedural states: Z98.890

## 2011-10-29 HISTORY — DX: Nausea with vomiting, unspecified: R11.2

## 2011-10-29 LAB — BASIC METABOLIC PANEL
CO2: 25 mEq/L (ref 19–32)
Calcium: 10 mg/dL (ref 8.4–10.5)
Potassium: 3.8 mEq/L (ref 3.5–5.1)
Sodium: 138 mEq/L (ref 135–145)

## 2011-10-29 LAB — SURGICAL PCR SCREEN
MRSA, PCR: NEGATIVE
Staphylococcus aureus: NEGATIVE

## 2011-10-29 LAB — CBC
MCH: 29.1 pg (ref 26.0–34.0)
Platelets: 250 10*3/uL (ref 150–400)
RBC: 4.85 MIL/uL (ref 4.22–5.81)
WBC: 7.1 10*3/uL (ref 4.0–10.5)

## 2011-10-29 NOTE — Pre-Procedure Instructions (Signed)
02-04-11 dg abd acute with chest 1 view 02-04-2011 in epic

## 2011-10-29 NOTE — Telephone Encounter (Signed)
.  [facility] AT [facility] STATES PATIENT IS IN OFFICE NOW AND NEEDS EKG/CHEST X-RAY FROM THREE WEEKS AGO FAX [phone] [facility] [phone]

## 2011-10-29 NOTE — Patient Instructions (Signed)
21 Jason [name]  10/29/2011   Your procedure is scheduled on: 2- 1-2013Report to [facility] at 0530 AM.  Call this number if you have problems the morning of surgery: [phone]   Remember:   Do not eat food or drink liquids:After Midnight.    .  Take these medicines the morning of surgery with A SIP OF WATER: metoprolol   Do not wear jewelry..  Do not wear lotions, powders, or perfumes. You may wear deodorant.    Do not bring valuables to the hospital.  Contacts, dentures or bridgework may not be worn into surgery.  Leave suitcase in the car. After surgery it may be brought to your room.  For patients admitted to the hospital, checkout time is 11:00 AM the day of discharge.     Special Instructions: CHG Shower Use Special Wash: 1/2 bottle night before surgery and 1/2 bottle morning of surgery.   Please read over the following fact sheets that you were given: MRSA Information Jasmine December Maeve Debord rn [facility] pre op nurse [phone]

## 2011-10-31 NOTE — Anesthesia Preprocedure Evaluation (Addendum)
Anesthesia Evaluation  Patient identified by MRN, date of birth, ID band Patient awake    Reviewed: Allergy & Precautions, H&P , NPO status , Patient's Chart, lab work & pertinent test results, reviewed documented beta blocker date and time   History of Anesthesia Complications (+) PONV  Airway Mallampati: II TM Distance: >3 FB Neck ROM: full    Dental No notable dental hx. (+) Teeth Intact and Dental Advisory Given   Pulmonary neg pulmonary ROS,  clear to auscultation  Pulmonary exam normal       Cardiovascular Exercise Tolerance: Good hypertension, On Home Beta Blockers regular Normal palpitations   Neuro/Psych Anxiety Cervical fusion Negative Neurological ROS  Negative Psych ROS   GI/Hepatic negative GI ROS, Neg liver ROS, hiatal hernia, GERD-  ,  Endo/Other  Negative Endocrine ROS  Renal/GU negative Renal ROS  Genitourinary negative   Musculoskeletal   Abdominal   Peds  Hematology negative hematology ROS (+)   Anesthesia Other Findings   Reproductive/Obstetrics negative OB ROS                          Anesthesia Physical Anesthesia Plan  ASA: II  Anesthesia Plan: General   Post-op Pain Management:    Induction: Intravenous  Airway Management Planned: Oral ETT  Additional Equipment:   Intra-op Plan:   Post-operative Plan: Extubation in OR  Informed Consent: I have reviewed the patients History and Physical, chart, labs and discussed the procedure including the risks, benefits and alternatives for the proposed anesthesia with the patient or authorized representative who has indicated his/her understanding and acceptance.   Dental Advisory Given  Plan Discussed with: CRNA and Surgeon  Anesthesia Plan Comments:         Anesthesia Quick Evaluation

## 2011-11-01 ENCOUNTER — Encounter (HOSPITAL_COMMUNITY): Payer: Self-pay | Admitting: Anesthesiology

## 2011-11-01 ENCOUNTER — Ambulatory Visit (HOSPITAL_COMMUNITY)
Admission: RE | Admit: 2011-11-01 | Discharge: 2011-11-02 | Disposition: A | Discharge status: Home / Self Care | Payer: Managed Care, Other (non HMO) | Source: Ambulatory Visit | Attending: Surgery | Admitting: Surgery

## 2011-11-01 ENCOUNTER — Encounter (HOSPITAL_COMMUNITY): Payer: Self-pay | Admitting: *Deleted

## 2011-11-01 ENCOUNTER — Encounter (HOSPITAL_COMMUNITY)
Admission: RE | Disposition: A | Discharge status: Home / Self Care | Payer: Self-pay | Source: Ambulatory Visit | Attending: Surgery

## 2011-11-01 ENCOUNTER — Ambulatory Visit (HOSPITAL_COMMUNITY): Payer: Managed Care, Other (non HMO) | Admitting: Anesthesiology

## 2011-11-01 DIAGNOSIS — K589 Irritable bowel syndrome without diarrhea: Secondary | ICD-10-CM | POA: Insufficient documentation

## 2011-11-01 DIAGNOSIS — K219 Gastro-esophageal reflux disease without esophagitis: Secondary | ICD-10-CM | POA: Insufficient documentation

## 2011-11-01 DIAGNOSIS — R112 Nausea with vomiting, unspecified: Secondary | ICD-10-CM | POA: Insufficient documentation

## 2011-11-01 DIAGNOSIS — Z01810 Encounter for preprocedural cardiovascular examination: Secondary | ICD-10-CM | POA: Insufficient documentation

## 2011-11-01 DIAGNOSIS — R109 Unspecified abdominal pain: Secondary | ICD-10-CM

## 2011-11-01 DIAGNOSIS — K639 Disease of intestine, unspecified: Secondary | ICD-10-CM | POA: Insufficient documentation

## 2011-11-01 DIAGNOSIS — R197 Diarrhea, unspecified: Secondary | ICD-10-CM

## 2011-11-01 DIAGNOSIS — G8929 Other chronic pain: Secondary | ICD-10-CM | POA: Insufficient documentation

## 2011-11-01 DIAGNOSIS — Z800 Family history of malignant neoplasm of digestive organs: Secondary | ICD-10-CM | POA: Insufficient documentation

## 2011-11-01 HISTORY — PX: LAPAROSCOPY: SHX197

## 2011-11-01 HISTORY — PX: HERNIA REPAIR: SHX51

## 2011-11-01 LAB — CREATININE, SERUM
Creatinine, Ser: 1.15 mg/dL (ref 0.50–1.35)
GFR calc Af Amer: 87 mL/min — ABNORMAL LOW (ref >90)
GFR calc non Af Amer: 75 mL/min — ABNORMAL LOW (ref >90)

## 2011-11-01 LAB — CBC
HCT: 37.8 % — ABNORMAL LOW (ref 39.0–52.0)
MCHC: 34.4 g/dL (ref 30.0–36.0)
MCV: 84.6 fL (ref 78.0–100.0)
RBC: 4.47 MIL/uL (ref 4.22–5.81)
RDW: 12.6 % (ref 11.5–15.5)

## 2011-11-01 SURGERY — LAPAROSCOPY, DIAGNOSTIC
Anesthesia: General | Wound class: Clean

## 2011-11-01 MED ORDER — PROMETHAZINE HCL 25 MG/ML IJ SOLN: 6.2500 mg | INTRAMUSCULAR | Status: DC | PRN

## 2011-11-01 MED ORDER — HYDROMORPHONE HCL PF 1 MG/ML IJ SOLN
0.2500 mg | INTRAMUSCULAR | Status: DC | PRN
  Administered 2011-11-01 (×4): 0.5 mg via INTRAVENOUS

## 2011-11-01 MED ORDER — POTASSIUM CHLORIDE IN NACL 20-0.9 MEQ/L-% IV SOLN
INTRAVENOUS | Status: DC
  Administered 2011-11-01 – 2011-11-02 (×2): via INTRAVENOUS
  Filled 2011-11-01 (×5): qty 1000

## 2011-11-01 MED ORDER — LIDOCAINE HCL (CARDIAC) 20 MG/ML IV SOLN
INTRAVENOUS | Status: DC | PRN
  Administered 2011-11-01: 100 mg via INTRAVENOUS

## 2011-11-01 MED ORDER — MIDAZOLAM HCL 5 MG/5ML IJ SOLN
INTRAMUSCULAR | Status: DC | PRN
  Administered 2011-11-01: 2 mg via INTRAVENOUS

## 2011-11-01 MED ORDER — OXYCODONE-ACETAMINOPHEN 5-325 MG PO TABS: 1.0000 | ORAL_TABLET | ORAL | Status: DC | PRN

## 2011-11-01 MED ORDER — ENOXAPARIN SODIUM 40 MG/0.4ML ~~LOC~~ SOLN
40.0000 mg | SUBCUTANEOUS | Status: DC
  Administered 2011-11-02: 40 mg via SUBCUTANEOUS
  Filled 2011-11-01: qty 0.4

## 2011-11-01 MED ORDER — PROPOFOL 10 MG/ML IV EMUL
INTRAVENOUS | Status: DC | PRN
  Administered 2011-11-01: 200 mg via INTRAVENOUS

## 2011-11-01 MED ORDER — CISATRACURIUM BESYLATE 2 MG/ML IV SOLN
INTRAVENOUS | Status: DC | PRN
  Administered 2011-11-01: 6 mg via INTRAVENOUS

## 2011-11-01 MED ORDER — LACTATED RINGERS IV SOLN
INTRAVENOUS | Status: DC | PRN
  Administered 2011-11-01 (×2): via INTRAVENOUS

## 2011-11-01 MED ORDER — ONDANSETRON HCL 4 MG/2ML IJ SOLN
4.0000 mg | Freq: Four times a day (QID) | INTRAMUSCULAR | Status: DC | PRN
  Administered 2011-11-01: 4 mg via INTRAVENOUS
  Filled 2011-11-01: qty 2

## 2011-11-01 MED ORDER — BUPIVACAINE HCL (PF) 0.5 % IJ SOLN
INTRAMUSCULAR | Status: AC
  Filled 2011-11-01: qty 30

## 2011-11-01 MED ORDER — DEXAMETHASONE SODIUM PHOSPHATE 4 MG/ML IJ SOLN
INTRAMUSCULAR | Status: DC | PRN
  Administered 2011-11-01: 10 mg via INTRAVENOUS

## 2011-11-01 MED ORDER — SUCCINYLCHOLINE CHLORIDE 20 MG/ML IJ SOLN
INTRAMUSCULAR | Status: DC | PRN
  Administered 2011-11-01: 100 mg via INTRAVENOUS

## 2011-11-01 MED ORDER — KETOROLAC TROMETHAMINE 30 MG/ML IJ SOLN
INTRAMUSCULAR | Status: AC
  Filled 2011-11-01: qty 1

## 2011-11-01 MED ORDER — ONDANSETRON HCL 4 MG PO TABS: 4.0000 mg | ORAL_TABLET | Freq: Four times a day (QID) | ORAL | Status: DC | PRN

## 2011-11-01 MED ORDER — OXYCODONE-ACETAMINOPHEN 5-325 MG PO TABS: ORAL_TABLET | ORAL | Status: AC

## 2011-11-01 MED ORDER — KETOROLAC TROMETHAMINE 30 MG/ML IJ SOLN
30.0000 mg | Freq: Four times a day (QID) | INTRAMUSCULAR | Status: DC
  Administered 2011-11-01 – 2011-11-02 (×5): 30 mg via INTRAVENOUS
  Filled 2011-11-01 (×7): qty 1

## 2011-11-01 MED ORDER — METOPROLOL SUCCINATE ER 25 MG PO TB24
25.0000 mg | ORAL_TABLET | Freq: Every day | ORAL | Status: DC
  Administered 2011-11-02: 25 mg via ORAL
  Filled 2011-11-01: qty 1

## 2011-11-01 MED ORDER — ACETAMINOPHEN 325 MG PO TABS: 650.0000 mg | ORAL_TABLET | ORAL | Status: DC | PRN

## 2011-11-01 MED ORDER — LACTATED RINGERS IV SOLN: INTRAVENOUS | Status: DC

## 2011-11-01 MED ORDER — ACETAMINOPHEN 10 MG/ML IV SOLN
INTRAVENOUS | Status: DC | PRN
  Administered 2011-11-01: 1000 mg via INTRAVENOUS

## 2011-11-01 MED ORDER — CEFAZOLIN SODIUM-DEXTROSE 2-3 GM-% IV SOLR
2.0000 g | INTRAVENOUS | Status: AC
  Administered 2011-11-01: 2 g via INTRAVENOUS

## 2011-11-01 MED ORDER — GLYCOPYRROLATE 0.2 MG/ML IJ SOLN
INTRAMUSCULAR | Status: DC | PRN
  Administered 2011-11-01: .6 mg via INTRAVENOUS

## 2011-11-01 MED ORDER — SCOPOLAMINE 1 MG/3DAYS TD PT72
MEDICATED_PATCH | TRANSDERMAL | Status: DC | PRN
  Administered 2011-11-01: 1.5 mg via TRANSDERMAL

## 2011-11-01 MED ORDER — HYDROMORPHONE HCL PF 1 MG/ML IJ SOLN
INTRAMUSCULAR | Status: AC
  Filled 2011-11-01: qty 1

## 2011-11-01 MED ORDER — ONDANSETRON HCL 4 MG/2ML IJ SOLN
INTRAMUSCULAR | Status: DC | PRN
  Administered 2011-11-01: 4 mg via INTRAVENOUS

## 2011-11-01 MED ORDER — SUFENTANIL CITRATE 50 MCG/ML IV SOLN
INTRAVENOUS | Status: DC | PRN
  Administered 2011-11-01: 20 ug via INTRAVENOUS
  Administered 2011-11-01: 10 ug via INTRAVENOUS

## 2011-11-01 MED ORDER — CILIDINIUM-CHLORDIAZEPOXIDE 2.5-5 MG PO CAPS
1.0000 | ORAL_CAPSULE | Freq: Three times a day (TID) | ORAL | Status: DC
  Administered 2011-11-01 – 2011-11-02 (×3): 1 via ORAL
  Filled 2011-11-01 (×4): qty 1

## 2011-11-01 MED ORDER — NEOSTIGMINE METHYLSULFATE 1 MG/ML IJ SOLN
INTRAMUSCULAR | Status: DC | PRN
  Administered 2011-11-01: 4 mg via INTRAVENOUS

## 2011-11-01 MED ORDER — BUPIVACAINE HCL (PF) 0.5 % IJ SOLN
INTRAMUSCULAR | Status: DC | PRN
  Administered 2011-11-01: 20 mL

## 2011-11-01 MED ORDER — LACTATED RINGERS IV SOLN
INTRAVENOUS | Status: DC
  Administered 2011-11-01: 11:00:00 via INTRAVENOUS

## 2011-11-01 MED ORDER — HYDROMORPHONE HCL PF 1 MG/ML IJ SOLN
2.0000 mg | INTRAMUSCULAR | Status: DC | PRN
  Administered 2011-11-01 (×2): 1 mg via INTRAVENOUS
  Administered 2011-11-01 – 2011-11-02 (×2): 2 mg via INTRAVENOUS
  Filled 2011-11-01 (×3): qty 2
  Filled 2011-11-01: qty 1

## 2011-11-01 SURGICAL SUPPLY — 44 items
APL SKNCLS STERI-STRIP NONHPOA (GAUZE/BANDAGES/DRESSINGS) ×1
BANDAGE ADHESIVE 1X3 (GAUZE/BANDAGES/DRESSINGS) IMPLANT
BENZOIN TINCTURE PRP APPL 2/3 (GAUZE/BANDAGES/DRESSINGS) ×2 IMPLANT
BINDER ABD UNIV 12 45-62 (WOUND CARE) IMPLANT
BINDER ABDOMINAL 46IN 62IN (WOUND CARE)
CANISTER SUCTION 2500CC (MISCELLANEOUS) ×2 IMPLANT
CANNULA ENDOPATH XCEL 11M (ENDOMECHANICALS) ×1 IMPLANT
CHLORAPREP W/TINT 26ML (MISCELLANEOUS) ×2 IMPLANT
CLOTH BEACON ORANGE TIMEOUT ST (SAFETY) ×2 IMPLANT
DECANTER SPIKE VIAL GLASS SM (MISCELLANEOUS) ×2 IMPLANT
DEVICE TROCAR PUNCTURE CLOSURE (ENDOMECHANICALS) ×2 IMPLANT
DISSECTOR BLUNT TIP ENDO 5MM (MISCELLANEOUS) IMPLANT
DRAPE LAPAROSCOPIC ABDOMINAL (DRAPES) ×2 IMPLANT
DRSG TEGADERM 2-3/8X2-3/4 SM (GAUZE/BANDAGES/DRESSINGS) IMPLANT
ELECT REM PT RETURN 9FT ADLT (ELECTROSURGICAL) ×2
ELECTRODE REM PT RTRN 9FT ADLT (ELECTROSURGICAL) ×1 IMPLANT
GLOVE BIOGEL PI IND STRL 7.0 (GLOVE) ×1 IMPLANT
GLOVE BIOGEL PI INDICATOR 7.0 (GLOVE) ×1
GLOVE SURG SIGNA 7.5 PF LTX (GLOVE) ×2 IMPLANT
GOWN STRL NON-REIN LRG LVL3 (GOWN DISPOSABLE) ×2 IMPLANT
GOWN STRL REIN XL XLG (GOWN DISPOSABLE) ×4 IMPLANT
HAND ACTIVATED (MISCELLANEOUS) IMPLANT
KIT BASIN OR (CUSTOM PROCEDURE TRAY) ×2 IMPLANT
NDL INSUFFLATION 14GA 120MM (NEEDLE) IMPLANT
NDL SPNL 22GX3.5 QUINCKE BK (NEEDLE) ×1 IMPLANT
NEEDLE INSUFFLATION 14GA 120MM (NEEDLE) IMPLANT
NEEDLE SPNL 22GX3.5 QUINCKE BK (NEEDLE) ×2 IMPLANT
NS IRRIG 1000ML POUR BTL (IV SOLUTION) ×2 IMPLANT
PEN SKIN MARKING BROAD (MISCELLANEOUS) ×2 IMPLANT
PENCIL BUTTON HOLSTER BLD 10FT (ELECTRODE) IMPLANT
SCISSORS LAP 5X35 DISP (ENDOMECHANICALS) IMPLANT
SET IRRIG TUBING LAPAROSCOPIC (IRRIGATION / IRRIGATOR) ×1 IMPLANT
SOLUTION ANTI FOG 6CC (MISCELLANEOUS) ×2 IMPLANT
STRIP CLOSURE SKIN 1/2X4 (GAUZE/BANDAGES/DRESSINGS) ×4 IMPLANT
SUT MNCRL AB 4-0 PS2 18 (SUTURE) IMPLANT
SUT NOVA 0 T19/GS 22DT (SUTURE) IMPLANT
TACKER 5MM HERNIA 3.5CML NAB (ENDOMECHANICALS) IMPLANT
TOWEL OR 17X26 10 PK STRL BLUE (TOWEL DISPOSABLE) ×2 IMPLANT
TRAY FOLEY CATH 14FRSI W/METER (CATHETERS) ×2 IMPLANT
TRAY LAP CHOLE (CUSTOM PROCEDURE TRAY) ×2 IMPLANT
TROCAR BLADELESS OPT 5 75 (ENDOMECHANICALS) ×3 IMPLANT
TROCAR XCEL BLUNT TIP 100MML (ENDOMECHANICALS) IMPLANT
TROCAR XCEL NON-BLD 11X100MML (ENDOMECHANICALS) ×1 IMPLANT
TUBING INSUFFLATION 10FT LAP (TUBING) ×2 IMPLANT

## 2011-11-01 NOTE — Anesthesia Procedure Notes (Signed)
Procedure Name: Intubation Date/Time: 11/01/2011 7:31 AM Performed by: Lurlean Leyden, Triston Skare L. Patient Re-evaluated:Patient Re-evaluated prior to inductionOxygen Delivery Method: Circle System Utilized Preoxygenation: Pre-oxygenation with 100% oxygen Intubation Type: IV induction Ventilation: Mask ventilation without difficulty and Oral airway inserted - appropriate to patient size Laryngoscope Size: Miller and 3 Grade View: Grade I Tube type: Oral Tube size: 8.0 mm Number of attempts: 1 Airway Equipment and Method: stylet Placement Confirmation: breath sounds checked- equal and bilateral,  positive ETCO2 and ETT inserted through vocal cords under direct vision Secured at: 22 cm Tube secured with: Tape Dental Injury: Teeth and Oropharynx as per pre-operative assessment

## 2011-11-01 NOTE — Op Note (Signed)
11/01/2011  8:26 AM  PATIENT:  Jason [name]  46 y.o. male  PRE-OPERATIVE DIAGNOSIS:  recurrent incisional hernia  POST-OPERATIVE DIAGNOSIS:  chronic abdominal pain  PROCEDURE:  Procedure(s): LAPAROSCOPY DIAGNOSTIC  SURGEON:  Surgeon(s): Shelly Rubenstein, MD  PHYSICIAN ASSISTANT:   ASSISTANTS: none   ANESTHESIA:   general  EBL:  Total I/O In: 1000 [I.V.:1000] Out: 425 [Urine:400; Blood:25]  BLOOD ADMINISTERED:none  DRAINS: none   LOCAL MEDICATIONS USED:  MARCAINE 20CC  SPECIMEN:  No Specimen  DISPOSITION OF SPECIMEN:  N/A  COUNTS:  YES  TOURNIQUET:  * No tourniquets in log *  DICTATION: .Dragon Dictation Indications: This is a 46 year old gentleman who had a previous laparoscopic repair of an incisional hernia at the umbilicus last year. He has a history of irritable bowel syndrome. He has been having worsening abdominal pain. He has had a colonoscopy which was unremarkable. He had a CAT scan of the abdomen and pelvis which suggested a recurrent hernia with no other abnormalities or evidence of obstruction. The decision was made to proceed with laparoscopy and possible repair.  Findings: Laparoscopy was performed. The patient's previously placed mesh was totally intact and there was no evidence of recurrent hernia. No bowel was stuck to the abdominal wall. The patient had chronic scarring with strange appearing adhesions and plaques on multiple areas of the small intestines. This was concentrated proximally. The rest of the abdomen appeared normal.  Procedure: The patient was brought to the operating room and identified as the correct patient. He was placed supine on the operating room table and general anesthesia was induced. His abdomen was then prepped and draped in the usual sterile fashion. Using a scalpel, I made a incision in the left upper quadrant through a previous scar. I then used the Optiview to carefully gain access into the abdominal cavity. Insufflation  of the abdomen was begun. The camera was then inserted and I took circumferential inspection of the abdomen. I could easily identify the previously placed mesh along the peritoneum. There was no bowel involved with the mesh or stuck to the abdominal wall. The mesh covered the previous hernia defect well and there was no evidence of recurrent hernia. On inspection of the small intestines, there were multiple areas of chronic scarring and plaque formation. This appeared mostly in the proximal small intestines, but could be seen in smaller areas along the distal small bowel. It did not appear consistent with a metastatic process. At this point, I had one of my partners visualize the intestines as well. We decided not to proceed with an open surgery. We decided to terminate this procedure with plans to work this up further as an outpatient. All ports were removed under direct vision and the abdomen was deflated. The incisions were anesthetized with Marcaine and closed with 4-0 Monocryl subcuticular sutures. Steri-Strips and Band-Aids were applied. The patient tolerated the procedure well. All the counts were correct at the end of the procedure. The patient was then extubated in the operating room and taken in stable condition to the recovery room. PLAN OF CARE: Admit for overnight observation  PATIENT DISPOSITION:  PACU - hemodynamically stable.   Delay start of Pharmacological VTE agent (>24hrs) due to surgical blood loss or risk of bleeding:  {YES/NO/NOT APPLICABLE:20182

## 2011-11-01 NOTE — Interval H&P Note (Signed)
History and Physical Interval Note: patient has had no change in his history or exam  11/01/2011 6:59 AM  Jason [name]  has presented today for surgery, with the diagnosis of recurrent incisional hernia  The various methods of treatment have been discussed with the patient and family. After consideration of risks, benefits and other options for treatment, the patient has consented to  Procedure(s): LAPAROSCOPIC INCISIONAL HERNIA WITH MESH, POSSIBLE OPEN REPAIR as a surgical intervention .  The patients' history has been reviewed, patient examined, no change in status, stable for surgery.  I have reviewed the patients' chart and labs.  Questions were answered to the patient's satisfaction.     Luciano Cinquemani A

## 2011-11-01 NOTE — Transfer of Care (Signed)
Immediate Anesthesia Transfer of Care Note  Patient: Jason [name]  Procedure(s) Performed:  LAPAROSCOPY DIAGNOSTIC  Patient Location: PACU  Anesthesia Type: General  Level of Consciousness: awake, alert  and oriented  Airway & Oxygen Therapy: Patient Spontanous Breathing and Patient connected to face mask oxygen  Post-op Assessment: Report given to PACU RN and Post -op Vital signs reviewed and stable  Post vital signs: Reviewed and stable  Complications: No apparent anesthesia complications

## 2011-11-01 NOTE — Addendum Note (Signed)
Addendum  created 11/01/11 1046 by Delmas Faucett L. Darcey Demma, CRNA   Modules edited:Anesthesia Medication Administration    

## 2011-11-01 NOTE — H&P (Signed)
Jason [name] is an 46 y.o. male.   Chief Complaint: abdominal pain  HPI: previous  Lap ventral hernia repair with mesh last year.  Now with recurrence confirmed on CT of abdomen and pelvis.  Patient having abdominal pain  Past Medical History  Diagnosis Date  . Headache   . Palpitations   . Esophageal reflux   . Irritable bowel syndrome   . Diverticulosis of colon (without mention of hemorrhage)   . Unspecified gastritis and gastroduodenitis without mention of hemorrhage   . Backache, unspecified   . Other and unspecified hyperlipidemia   . Unspecified essential hypertension   . Anxiety state, unspecified   . IBS (irritable bowel syndrome)   . Peptic ulcer   . Anemia   . Arthritis   . Nausea   . Abdominal distension   . Abdominal pain   . Nasal congestion   . Cough     with bronchitis 10-12-2011  . Hernia   . Blood transfusion march 2012  . Hiatal hernia   . PONV (postoperative nausea and vomiting)     bowel blockage after may 2012 surgery    Past Surgical History  Procedure Date  . Umbilical hernia repair 01/2011  . Cervical fusion  2007    c 4   . Stomach surgery march 2012    bleeding ulcer  . Back surgery     X3, lower  back  . Appendectomy 2000    Family History  Problem Relation Age of Onset  . Colon cancer Maternal Grandmother   . Stomach cancer Maternal Grandmother     METS  . Esophageal cancer Maternal Grandmother   . Uterine cancer Sister   . Heart disease Mother   . Clotting disorder Mother   . Diabetes Father   . Heart disease Father   . Pancreatic cancer Maternal Grandfather   . Cancer Maternal Grandfather     colon, stomach  . Leukemia Brother    Social History:  reports that he has never smoked. He has never used smokeless tobacco. He reports that he does not drink alcohol or use illicit drugs.  Allergies:  Allergies  Allergen Reactions  . Hydrocodone-Acetaminophen Palpitations    Increased heart rate    Medications Prior to  Admission  Medication Dose Route Frequency Provider Last Rate Last Dose  . ceFAZolin (ANCEF) IVPB 2 g/50 mL premix  2 g Intravenous 60 min Pre-Op Shelly Rubenstein, MD       Medications Prior to Admission  Medication Sig Dispense Refill  . clidinium-chlordiazePOXIDE (LIBRAX) 2.5-5 MG per capsule Take 1 capsule by mouth 3 (three) times daily with meals.  90 capsule  3  . metoprolol succinate (TOPROL-XL) 25 MG 24 hr tablet Take 25 mg by mouth daily before breakfast.         No results found for this or any previous visit (from the past 48 hour(s)). No results found.  Review of Systems  Gastrointestinal: Positive for nausea, vomiting and abdominal pain.  All other systems reviewed and are negative.    Blood pressure 134/74, pulse 89, temperature 97.8 F (36.6 C), resp. rate 20, SpO2 97.00%. Physical Exam  Constitutional: He is oriented to person, place, and time. He appears well-developed and well-nourished. No distress.  HENT:  Head: Normocephalic and atraumatic.  Eyes: Conjunctivae are normal. Pupils are equal, round, and reactive to light.  Neck: Normal range of motion. Neck supple.  Cardiovascular: Normal rate and regular rhythm.   Respiratory: Effort normal  and breath sounds normal. No respiratory distress.  GI: Soft. Bowel sounds are normal. He exhibits no distension. There is tenderness.  Musculoskeletal: Normal range of motion.  Lymphadenopathy:    He has no cervical adenopathy.  Neurological: He is alert and oriented to person, place, and time.  Skin: Skin is warm and dry.  Psychiatric: His behavior is normal. Judgment normal.     Assessment/Plan Recurrent ventral hernia  Plan laparoscopic, possible open repair with mesh.  Risks including, but not limited to bleeding, infection, injury to the bowel, recurrence, etc discussed with patient in detail.   Likelihood of success if good  Cathye Kreiter A 11/01/2011, 6:55 AM

## 2011-11-01 NOTE — Anesthesia Postprocedure Evaluation (Signed)
  Anesthesia Post-op Note  Patient: Jason [name]  Procedure(s) Performed:  LAPAROSCOPY DIAGNOSTIC  Patient Location: PACU  Anesthesia Type: General  Level of Consciousness: awake and alert   Airway and Oxygen Therapy: Patient Spontanous Breathing  Post-op Pain: mild  Post-op Assessment: Post-op Vital signs reviewed, Patient's Cardiovascular Status Stable, Respiratory Function Stable, Patent Airway and No signs of Nausea or vomiting  Post-op Vital Signs: stable  Complications: No apparent anesthesia complications

## 2011-11-01 NOTE — Preoperative (Signed)
Beta Blockers   Reason not to administer Beta Blockers:Not Applicable 

## 2011-11-01 NOTE — Addendum Note (Signed)
Addendum  created 11/01/11 1046 by Darci Needle. Lurlean Leyden, CRNA   Modules edited:Anesthesia Medication Administration

## 2011-11-02 NOTE — Progress Notes (Signed)
Patient discharged via wheelchair to home.  Pt. Discharge instructions given and prescription.  Patient verbalized understanding.  No change from morning assessment.

## 2011-11-02 NOTE — Progress Notes (Signed)
1 Day Post-Op  Subjective: Comfortable. Voiding.  Objective: Vital signs in last 24 hours: Temp:  [97.6 F (36.4 C)-98.6 F (37 C)] 98.3 F (36.8 C) (02/02 0422) Pulse Rate:  [75-99] 75  (02/02 0422) Resp:  [14-20] 20  (02/02 0422) BP: (112-125)/(60-75) 123/68 mmHg (02/02 0422) SpO2:  [93 %-94 %] 94 % (02/02 0422) Weight:  [218 lb 11.1 oz (99.2 kg)] 218 lb 11.1 oz (99.2 kg) (02/01 1118) Last BM Date: 10/31/11  Intake/Output from previous day: 02/01 0701 - 02/02 0700 In: 2976 [P.O.:840; I.V.:2136] Out: 2675 [Urine:2650; Blood:25] Intake/Output this shift: Total I/O In: -  Out: 425 [Urine:425]  PE: Abd-soft, dressings dry  Lab Results:   [facility] 11/01/11 0909  WBC 6.2  HGB 13.0  HCT 37.8*  PLT 211   BMET  Basename 11/01/11 0909  NA --  K --  CL --  CO2 --  GLUCOSE --  BUN --  CREATININE 1.15  CALCIUM --   PT/INR No results found for this basename: LABPROT:2,INR:2 in the last 72 hours Comprehensive Metabolic Panel:    Component Value Date/Time   NA 138 10/29/2011 1525   K 3.8 10/29/2011 1525   CL 103 10/29/2011 1525   CO2 25 10/29/2011 1525   BUN 16 10/29/2011 1525   CREATININE 1.15 11/01/2011 0909   GLUCOSE 94 10/29/2011 1525   CALCIUM 10.0 10/29/2011 1525   AST 36 09/17/2011 1614   ALT 21 09/17/2011 1614   ALKPHOS 75 09/17/2011 1614   BILITOT 0.6 09/17/2011 1614   PROT 7.9 09/17/2011 1614   ALBUMIN 4.5 09/17/2011 1614     Studies/Results: No results found.  Anti-infectives: Anti-infectives     Start     Dose/Rate Route Frequency Ordered Stop   11/01/11 0600   ceFAZolin (ANCEF) IVPB 2 g/50 mL premix        2 g 100 mL/hr over 30 Minutes Intravenous 60 min pre-op 11/01/11 0522 11/01/11 0735          Assessment Active Problems:  Abd pain s/p diagnostic laparoscopy-stable overnight.    LOS: 1 day   Plan: Discharge.  Instructions given.   Adolph Pollack 11/02/2011

## 2011-11-04 ENCOUNTER — Other Ambulatory Visit (INDEPENDENT_AMBULATORY_CARE_PROVIDER_SITE_OTHER): Payer: Self-pay | Admitting: Surgery

## 2011-11-04 DIAGNOSIS — G8929 Other chronic pain: Secondary | ICD-10-CM

## 2011-11-05 ENCOUNTER — Encounter (HOSPITAL_COMMUNITY): Payer: Self-pay | Admitting: Surgery

## 2011-11-05 NOTE — Telephone Encounter (Signed)
Sent requested records to [facility] ATTN: Jasmine December 11/05/11 Selena Batten s

## 2011-11-06 ENCOUNTER — Encounter: Payer: Self-pay | Admitting: Gastroenterology

## 2011-11-06 ENCOUNTER — Ambulatory Visit (INDEPENDENT_AMBULATORY_CARE_PROVIDER_SITE_OTHER): Payer: Managed Care, Other (non HMO) | Admitting: Gastroenterology

## 2011-11-06 VITALS — BP 134/84 | HR 88 | Ht 71.0 in | Wt 208.0 lb

## 2011-11-06 DIAGNOSIS — R109 Unspecified abdominal pain: Secondary | ICD-10-CM

## 2011-11-06 NOTE — Progress Notes (Signed)
This is a 46 year old Caucasian male who recently had followup laparoscopic surgery to assess a possible incisional hernia from previous laparoscopic repair of a ventral hernia 6-8 months ago. Because of his persistent epigastric pain with intermittent nausea and vomiting, he had a thorough GI workup including endoscopy and colonoscopy which were unremarkable, and CT enteroscopy that showed no evidence of small bowel disease. At the time of his recent laparoscopy by Dr. Rayburn Ma, there were" plaques" noted on the serosal surface of his small bowel proximally, but for some reason these were not biopsied. The previous abdominal mesh appeared intact, and the surgery was terminated. The patient allegedly says that the surgeon told him that he had Crohn's disease. He continues with mid abdominal pain with alternating diarrhea and constipation refractory to hydrocodone administration an anti-spasmodic. There's been no history of fever, chills, or systemic complaints. View of his previous x-rays and laboratory data shows no specific abnormalities.  Current Medications, Allergies, Past Medical History, Past Surgical History, Family History and Social History were reviewed in [employer] record.  Pertinent Review of Systems Negative   Physical Exam: Awake and alert but fairly lethargic appearing with a flat affect. His abdomen shows no organomegaly, masses, tenderness, or abnormal bowel sounds. There are no stigmata of chronic liver disease.    Assessment and Plan: I do not think this patient has inflammatory bowel disease, specifically Crohn's disease. Exactly what the surgeons saw af the time of his laparoscopy is unclear. He may have some type of idiopathic fibrosing serositis or other inflammatory conditions involving  the serosal surface of his gut. At this point, I think the best course of action would be to refer him to [facility] for review of his problems, and  also possible followup surgical consultation. Patient did not bring his pictures from his recent surgery him,but  I've asked him to be sure to take these with him when he goes to [facility] for referral. He has no symptoms to suggest carcinoid syndrome, no hypereosinophilia, normal liver function tests and sed rate, and no significant systemic complaints. We will continue his current medications as listed and reviewed.  Please copy her primary care physician, referring physician, and pertinent subspecialists. No diagnosis found.

## 2011-11-06 NOTE — Patient Instructions (Signed)
We will contact you with an appt at [facility].

## 2011-11-07 ENCOUNTER — Telehealth: Payer: Self-pay | Admitting: Gastroenterology

## 2011-11-07 NOTE — Telephone Encounter (Signed)
Made referral to [facility] his appt is 01/20/2012 8am with Dr Redgie Grayer that is the first appt with any GI MD at [facility]. If you would like to have the pt seen sooner you will have to call [phone] and talk to Dr Merri Brunette. Please advise

## 2011-11-07 NOTE — Telephone Encounter (Signed)
Informed pt Jason [name], CMA is working on the referral and she will call Jason [name] back; pt stated understanding.

## 2011-11-08 ENCOUNTER — Telehealth: Payer: Self-pay | Admitting: Gastroenterology

## 2011-11-08 NOTE — Telephone Encounter (Signed)
See previous note about appt to [facility]

## 2011-11-11 ENCOUNTER — Ambulatory Visit (INDEPENDENT_AMBULATORY_CARE_PROVIDER_SITE_OTHER): Payer: Managed Care, Other (non HMO) | Admitting: Surgery

## 2011-11-11 ENCOUNTER — Encounter (INDEPENDENT_AMBULATORY_CARE_PROVIDER_SITE_OTHER): Payer: Self-pay | Admitting: Surgery

## 2011-11-11 VITALS — BP 140/80 | HR 70 | Temp 97.8°F | Resp 18 | Ht 71.0 in | Wt 209.2 lb

## 2011-11-11 DIAGNOSIS — Z09 Encounter for follow-up examination after completed treatment for conditions other than malignant neoplasm: Secondary | ICD-10-CM

## 2011-11-11 DIAGNOSIS — K529 Noninfective gastroenteritis and colitis, unspecified: Secondary | ICD-10-CM

## 2011-11-11 DIAGNOSIS — K5289 Other specified noninfective gastroenteritis and colitis: Secondary | ICD-10-CM

## 2011-11-11 NOTE — Progress Notes (Signed)
Subjective:     Patient ID: Jason [name], male   DOB: 17-Sep-1966, 46 y.o.   MRN: [id number]  HPI He is here for his postoperative visit. He reports he is still having central and epigastric abdominal discomfort and diarrhea. He has minimal discomfort from the laparoscopic port sites. He has seen Dr. Jarold Motto who apparently is trying to refer him to [facility]. The patient is considering a second opinion here in [place].  Review of Systems     Objective:   Physical Exam On exam, his abdomen is soft and his incisions are clean.    Assessment:     Patient status post diagnostic laparoscopy without evidence of recurrent hernia    Plan:     Because of the inflammatory changes and possible scarring in the proximal small bowel, and further GI workup I believe is necessary. He would like a second opinion here in [place] which I feel is very reasonable. I will thus refer him to [facility] gastroenterology for further workup and opinion.

## 2011-11-11 NOTE — Telephone Encounter (Signed)
Pt aware of his appt at [facility], Dr Magnus Ivan has referred pt to Dr Dulce Sellar at [facility], patient states that if he can get a appt with Dr Dulce Sellar in the next couple of weeks he will be transferring his care to Dr Dulce Sellar, because he has been going through all this pain for 4 years with Dr Jarold Motto and he came here to get test done that Dr Magnus Ivan wanted done and Dr Jarold Motto didn't want to order them. i advised him that if he goes to an appt with Dr outlaw he is transferring his care. He said he is aware said thank you and hung up.

## 2011-11-11 NOTE — Telephone Encounter (Signed)
Discharge letter and form filled out and given to Dr Jarold Motto to sign and sent to medical records to mail out.

## 2011-11-11 NOTE — Telephone Encounter (Signed)
Fine..he has freedom to choose med practices.will need d/c letter...drp

## 2011-11-12 ENCOUNTER — Telehealth: Payer: Self-pay | Admitting: Gastroenterology

## 2011-11-12 NOTE — Telephone Encounter (Signed)
Dismissal Letter sent by Certified Mail 11/12/2011

## 2011-11-18 ENCOUNTER — Encounter (INDEPENDENT_AMBULATORY_CARE_PROVIDER_SITE_OTHER): Payer: Managed Care, Other (non HMO) | Admitting: Surgery

## 2011-11-22 ENCOUNTER — Other Ambulatory Visit: Payer: Self-pay | Admitting: Gastroenterology

## 2011-11-22 DIAGNOSIS — R1013 Epigastric pain: Secondary | ICD-10-CM

## 2011-11-22 DIAGNOSIS — R112 Nausea with vomiting, unspecified: Secondary | ICD-10-CM

## 2011-11-26 ENCOUNTER — Ambulatory Visit
Admission: RE | Admit: 2011-11-26 | Discharge: 2011-11-26 | Disposition: A | Discharge status: Home / Self Care | Payer: Managed Care, Other (non HMO) | Source: Ambulatory Visit | Attending: Gastroenterology | Admitting: Gastroenterology

## 2011-11-26 DIAGNOSIS — R1013 Epigastric pain: Secondary | ICD-10-CM

## 2011-11-26 DIAGNOSIS — R112 Nausea with vomiting, unspecified: Secondary | ICD-10-CM

## 2011-12-03 ENCOUNTER — Encounter (INDEPENDENT_AMBULATORY_CARE_PROVIDER_SITE_OTHER): Payer: Self-pay | Admitting: Surgery

## 2011-12-03 ENCOUNTER — Ambulatory Visit (INDEPENDENT_AMBULATORY_CARE_PROVIDER_SITE_OTHER): Payer: Managed Care, Other (non HMO) | Admitting: Surgery

## 2011-12-03 VITALS — BP 122/84 | HR 96 | Temp 98.2°F | Ht 71.0 in | Wt 211.2 lb

## 2011-12-03 DIAGNOSIS — Z09 Encounter for follow-up examination after completed treatment for conditions other than malignant neoplasm: Secondary | ICD-10-CM

## 2011-12-03 NOTE — Progress Notes (Signed)
Subjective:     Patient ID: Jason [name], male   DOB: [birth year], 46 y.o.   MRN: [id number]  HPI He is still having abdominal bloating and abdominal pain centrally in the abdomen. The steroids helped slightly as well as the Nexium he is on. He has had an upper GI performed which showed some esophageal spasm but was otherwise unremarkable. According to him, Dr. Dulce Sellar had no further recommendations.  Review of Systems     Objective:   Physical Exam On exam, his abdomen is soft and nondistended. There is still some central tenderness with guarding    Assessment:     Patient status post diagnostic laparoscopy. Chronic abdominal pain and findings at surgery of uncertain etiology    Plan:     At this point, I will try the steroids again. The only other option would be exploratory laparotomy with possible biopsy. I will see him back in 2-3 weeks to see how he was doing

## 2011-12-17 ENCOUNTER — Other Ambulatory Visit (HOSPITAL_COMMUNITY): Payer: Self-pay | Admitting: Gastroenterology

## 2011-12-17 ENCOUNTER — Other Ambulatory Visit: Payer: Self-pay | Admitting: Gastroenterology

## 2011-12-17 DIAGNOSIS — R112 Nausea with vomiting, unspecified: Secondary | ICD-10-CM

## 2011-12-20 ENCOUNTER — Ambulatory Visit
Admission: RE | Admit: 2011-12-20 | Discharge: 2011-12-20 | Disposition: A | Discharge status: Home / Self Care | Payer: Managed Care, Other (non HMO) | Source: Ambulatory Visit | Attending: Gastroenterology | Admitting: Gastroenterology

## 2011-12-20 DIAGNOSIS — R112 Nausea with vomiting, unspecified: Secondary | ICD-10-CM

## 2011-12-23 ENCOUNTER — Ambulatory Visit (INDEPENDENT_AMBULATORY_CARE_PROVIDER_SITE_OTHER): Payer: Managed Care, Other (non HMO) | Admitting: Surgery

## 2011-12-23 ENCOUNTER — Encounter (INDEPENDENT_AMBULATORY_CARE_PROVIDER_SITE_OTHER): Payer: Self-pay | Admitting: Surgery

## 2011-12-23 VITALS — BP 148/83 | HR 120 | Temp 97.1°F | Ht 71.0 in | Wt 212.0 lb

## 2011-12-23 DIAGNOSIS — G8929 Other chronic pain: Secondary | ICD-10-CM

## 2011-12-23 DIAGNOSIS — R109 Unspecified abdominal pain: Secondary | ICD-10-CM

## 2011-12-23 NOTE — Progress Notes (Signed)
Subjective:     Patient ID: Jason [name], male   DOB: 1966/06/16, 46 y.o.   MRN: [id number]  HPI Jason [name] continues to have chronic abdominal pain with cramping and occasional emesis. He is now seeing Jason [name]. I have also had a discussion with him about this from a gastroenterologic standpoint. His bowel movements were normal  Review of Systems     Objective:   Physical Exam His abdominal exam shows mild diffuse tenderness    Assessment:     Chronic abdominal pain of uncertain etiology. He has serologies that may suggest Crohn's. At his diagnostic laparoscopy there was a large amount of scar-type tissue with exudate on the bowels.    Plan:     We have a long discussion and easier to proceed with exploratory laparotomy with possible bowel resection to obtain some kind of tissue diagnosis. I believe is important for me to write every inch of his bowel disease or any other abnormalities. He agrees. Surgery will be scheduled

## 2011-12-26 ENCOUNTER — Other Ambulatory Visit (HOSPITAL_COMMUNITY): Payer: Managed Care, Other (non HMO)

## 2011-12-30 HISTORY — PX: SMALL INTESTINE SURGERY: SHX150

## 2012-01-06 ENCOUNTER — Encounter (HOSPITAL_COMMUNITY)
Admission: RE | Admit: 2012-01-06 | Discharge: 2012-01-06 | Disposition: A | Discharge status: Home / Self Care | Payer: Managed Care, Other (non HMO) | Source: Ambulatory Visit | Attending: Surgery | Admitting: Surgery

## 2012-01-06 ENCOUNTER — Encounter (HOSPITAL_COMMUNITY)
Admission: RE | Admit: 2012-01-06 | Discharge: 2012-01-06 | Disposition: A | Discharge status: Home / Self Care | Payer: Managed Care, Other (non HMO) | Source: Ambulatory Visit | Attending: Anesthesiology | Admitting: Anesthesiology

## 2012-01-06 ENCOUNTER — Encounter (HOSPITAL_COMMUNITY): Payer: Self-pay | Admitting: Pharmacy Technician

## 2012-01-06 ENCOUNTER — Encounter (HOSPITAL_COMMUNITY): Payer: Self-pay

## 2012-01-06 HISTORY — DX: Ankylosing spondylitis of unspecified sites in spine: M45.9

## 2012-01-06 HISTORY — DX: Pneumonia, unspecified organism: J18.9

## 2012-01-06 HISTORY — DX: Acute upper respiratory infection, unspecified: J06.9

## 2012-01-06 LAB — CBC
HCT: 44 % (ref 39.0–52.0)
Hemoglobin: 15.4 g/dL (ref 13.0–17.0)
MCV: 85.3 fL (ref 78.0–100.0)
RBC: 5.16 MIL/uL (ref 4.22–5.81)
WBC: 7.4 10*3/uL (ref 4.0–10.5)

## 2012-01-06 LAB — SURGICAL PCR SCREEN
MRSA, PCR: NEGATIVE
Staphylococcus aureus: NEGATIVE

## 2012-01-06 LAB — BASIC METABOLIC PANEL
CO2: 24 mEq/L (ref 19–32)
Chloride: 102 mEq/L (ref 96–112)
Potassium: 4.1 mEq/L (ref 3.5–5.1)
Sodium: 136 mEq/L (ref 135–145)

## 2012-01-06 NOTE — Pre-Procedure Instructions (Signed)
21 Jason [name]  01/06/2012   Your procedure is scheduled on:  01/10/2012  Report to [facility] at 9:00 AM.  Call this number if you have problems the morning of surgery: [phone]   Remember:   Do not eat food:After Midnight.  May have clear liquids: up to 4 Hours before arrival. Nothing after 5:00a.m.  Clear liquids include soda, tea, black coffee, apple or grape juice, broth.  Take these medicines the morning of surgery with A SIP OF WATER: metoprolol, nexium   Do not wear jewelry, make-up or nail polish.  Do not wear lotions, powders, or perfumes. You may wear deodorant.  Do not shave 48 hours prior to surgery.  Do not bring valuables to the hospital.  Contacts, dentures or bridgework may not be worn into surgery.  Leave suitcase in the car. After surgery it may be brought to your room.  For patients admitted to the hospital, checkout time is 11:00 AM the day of discharge.   Patients discharged the day of surgery will not be allowed to drive home.  Name and phone number of your driver: /w Significant other - [name]   Special Instructions: CHG Shower Use Special Wash: 1/2 bottle night before surgery and 1/2 bottle morning of surgery.   Please read over the following fact sheets that you were given: Pain Booklet, Coughing and Deep Breathing, Blood Transfusion Information, MRSA Information and Surgical Site Infection Prevention

## 2012-01-09 MED ORDER — SODIUM CHLORIDE 0.9 % IV SOLN
1.0000 g | INTRAVENOUS | Status: AC
  Administered 2012-01-10: 1 g via INTRAVENOUS
  Filled 2012-01-09: qty 1

## 2012-01-09 NOTE — H&P (Signed)
Jason [name] is an 46 y.o. male.   Chief Complaint: chronic abdominal pain  HPI: pleasant gentleman with chronic abdominal pain of uncertain etiology.  Previous diagnostic laparoscopy showed some sort of fibrinous or inflammatory process of undetermined etiology.  Thorough workup including xray's, SBFT, CT, endo, etc are unremarkable  Past Medical History  Diagnosis Date  . Headache   . Palpitations   . Esophageal reflux   . Irritable bowel syndrome   . Diverticulosis of colon (without mention of hemorrhage)   . Unspecified gastritis and gastroduodenitis without mention of hemorrhage   . Backache, unspecified   . Other and unspecified hyperlipidemia   . Anxiety state, unspecified   . IBS (irritable bowel syndrome)   . Peptic ulcer   . Nausea   . Abdominal distension   . Abdominal pain   . Nasal congestion   . Cough     with bronchitis 10-12-2011  . Hernia   . Blood transfusion march 2012  . Hiatal hernia   . PONV (postoperative nausea and vomiting)     bowel blockage after may 2012 surgery  . Family history of malignant neoplasm of gastrointestinal tract   . Recurrent upper respiratory infection (URI)     bronchitis- 10/2011- treated /w antbx- by PCP  . Pneumonia     [facility]  . Anemia     anemic- 11/2010, post bleeding ulcer - treated ./w   bld. transfusion   . Arthritis     back, neck , shoulder   . Spondylitis, ankylosing     lower back issues related to AS  . Unspecified essential hypertension     Dr. Jens Som- consulted /w pt.  (2008)due to ^ heartrate & SOB /w climbing stairs, had echo /stress- wnl. Currently PCP manages BP      Past Surgical History  Procedure Date  . Umbilical hernia repair 01/2011    1998,  01/2011 & 11/01/2011  . Cervical fusion  2007    c 4   . Stomach surgery march 2012    bleeding ulcer  . Back surgery     X3, lower  back  . Appendectomy 2000  . Laparoscopy 11/01/2011    Procedure: LAPAROSCOPY DIAGNOSTIC;  Surgeon: Shelly Rubenstein,  MD;  Location: [facility];  Service: General;  Laterality: N/A;  . Hernia repair 11/01/2011    incisional hernia    Family History  Problem Relation Age of Onset  . Colon cancer Maternal Grandmother   . Stomach cancer Maternal Grandmother     METS  . Esophageal cancer Maternal Grandmother   . Uterine cancer Sister   . Heart disease Mother   . Clotting disorder Mother   . Diabetes Father   . Heart disease Father   . Pancreatic cancer Maternal Grandfather   . Leukemia Brother   . Anesthesia problems Neg Hx    Social History:  reports that he has never smoked. He has never used smokeless tobacco. He reports that he drinks alcohol. He reports that he does not use illicit drugs.  Allergies:  Allergies  Allergen Reactions  . Hydrocodone-Acetaminophen Palpitations    Increased heart rate    Medications Prior to Admission  Medication Dose Route Frequency Provider Last Rate Last Dose  . ertapenem (INVANZ) 1 g in sodium chloride 0.9 % 50 mL IVPB  1 g Intravenous 60 min Pre-Op Shelly Rubenstein, MD       Medications Prior to Admission  Medication Sig Dispense Refill  . esomeprazole (  NEXIUM) 40 MG capsule Take 40 mg by mouth 2 (two) times daily.      . metoprolol succinate (TOPROL-XL) 25 MG 24 hr tablet Take 25 mg by mouth daily before breakfast.       . ondansetron (ZOFRAN) 4 MG tablet Take 4 mg by mouth as needed. For nausea.      Marland Kitchen oxyCODONE-acetaminophen (PERCOCET) 5-325 MG per tablet Take 1 tablet by mouth every 6 (six) hours as needed. For pain.        No results found for this or any previous visit (from the past 48 hour(s)). No results found.  Review of Systems  Gastrointestinal: Positive for nausea, vomiting, abdominal pain, diarrhea and constipation.  All other systems reviewed and are negative.    There were no vitals taken for this visit. Physical Exam  Constitutional: He is oriented to person, place, and time. He appears well-developed and well-nourished. No distress.    HENT:  Head: Normocephalic and atraumatic.  Neck: Normal range of motion. Neck supple. No tracheal deviation present. No thyromegaly present.  Cardiovascular: Normal rate, regular rhythm, normal heart sounds and intact distal pulses.   Respiratory: Effort normal and breath sounds normal. No respiratory distress. He has no wheezes. He has no rales.  GI: Soft. Bowel sounds are normal. He exhibits no distension and no mass. There is tenderness. There is guarding.  Musculoskeletal: Normal range of motion.  Neurological: He is alert and oriented to person, place, and time.  Skin: Skin is warm and dry.  Psychiatric: His behavior is normal. Judgment normal.     Assessment/Plan Abdominal pain of uncertain etiology  Given previous findings at laparoscopy and lack of another identifiable source, the decision has been made to proceed with exploratory laparotomy.  The risks have been discussed in detail including but not limited to the need for biopsy, bowel resection, injury, need to remove previous mesh, mesh infection, anastomotic leak, need for further surgery, and the chance this may not solve his pain  Ammara Raj A 01/09/2012, 8:06 PM

## 2012-01-10 ENCOUNTER — Encounter (HOSPITAL_COMMUNITY)
Admission: RE | Disposition: A | Discharge status: Home / Self Care | Payer: Self-pay | Source: Ambulatory Visit | Attending: Surgery

## 2012-01-10 ENCOUNTER — Encounter (HOSPITAL_COMMUNITY): Payer: Self-pay | Admitting: Anesthesiology

## 2012-01-10 ENCOUNTER — Inpatient Hospital Stay (HOSPITAL_COMMUNITY)
Admission: RE | Admit: 2012-01-10 | Discharge: 2012-01-16 | DRG: 331 | Disposition: A | Discharge status: Home / Self Care | Payer: Managed Care, Other (non HMO) | Source: Ambulatory Visit | Attending: Surgery | Admitting: Surgery

## 2012-01-10 ENCOUNTER — Ambulatory Visit (HOSPITAL_COMMUNITY): Payer: Managed Care, Other (non HMO) | Admitting: Anesthesiology

## 2012-01-10 ENCOUNTER — Encounter (HOSPITAL_COMMUNITY): Payer: Self-pay | Admitting: *Deleted

## 2012-01-10 DIAGNOSIS — K660 Peritoneal adhesions (postprocedural) (postinfection): Secondary | ICD-10-CM

## 2012-01-10 DIAGNOSIS — M1289 Other specific arthropathies, not elsewhere classified, multiple sites: Secondary | ICD-10-CM | POA: Diagnosis present

## 2012-01-10 DIAGNOSIS — R51 Headache: Secondary | ICD-10-CM | POA: Diagnosis not present

## 2012-01-10 DIAGNOSIS — E669 Obesity, unspecified: Secondary | ICD-10-CM | POA: Diagnosis present

## 2012-01-10 DIAGNOSIS — I10 Essential (primary) hypertension: Secondary | ICD-10-CM | POA: Diagnosis present

## 2012-01-10 DIAGNOSIS — R109 Unspecified abdominal pain: Secondary | ICD-10-CM

## 2012-01-10 DIAGNOSIS — K5289 Other specified noninfective gastroenteritis and colitis: Principal | ICD-10-CM | POA: Diagnosis present

## 2012-01-10 DIAGNOSIS — K589 Irritable bowel syndrome without diarrhea: Secondary | ICD-10-CM | POA: Diagnosis present

## 2012-01-10 DIAGNOSIS — E785 Hyperlipidemia, unspecified: Secondary | ICD-10-CM | POA: Diagnosis present

## 2012-01-10 DIAGNOSIS — G8929 Other chronic pain: Secondary | ICD-10-CM | POA: Diagnosis present

## 2012-01-10 DIAGNOSIS — M459 Ankylosing spondylitis of unspecified sites in spine: Secondary | ICD-10-CM | POA: Diagnosis present

## 2012-01-10 DIAGNOSIS — Z8711 Personal history of peptic ulcer disease: Secondary | ICD-10-CM

## 2012-01-10 DIAGNOSIS — Z800 Family history of malignant neoplasm of digestive organs: Secondary | ICD-10-CM

## 2012-01-10 DIAGNOSIS — K219 Gastro-esophageal reflux disease without esophagitis: Secondary | ICD-10-CM | POA: Diagnosis present

## 2012-01-10 DIAGNOSIS — F411 Generalized anxiety disorder: Secondary | ICD-10-CM | POA: Diagnosis present

## 2012-01-10 DIAGNOSIS — K5730 Diverticulosis of large intestine without perforation or abscess without bleeding: Secondary | ICD-10-CM | POA: Diagnosis present

## 2012-01-10 DIAGNOSIS — K449 Diaphragmatic hernia without obstruction or gangrene: Secondary | ICD-10-CM | POA: Diagnosis present

## 2012-01-10 HISTORY — PX: BOWEL RESECTION: SHX1257

## 2012-01-10 HISTORY — PX: LAPAROTOMY: SHX154

## 2012-01-10 LAB — ABO/RH: ABO/RH(D): O NEG

## 2012-01-10 LAB — TYPE AND SCREEN

## 2012-01-10 SURGERY — LAPAROTOMY, EXPLORATORY
Anesthesia: General | Site: Abdomen | Wound class: Clean Contaminated

## 2012-01-10 MED ORDER — ARTIFICIAL TEARS OP OINT
TOPICAL_OINTMENT | OPHTHALMIC | Status: DC | PRN
  Administered 2012-01-10: 1 via OPHTHALMIC

## 2012-01-10 MED ORDER — NEOSTIGMINE METHYLSULFATE 1 MG/ML IJ SOLN
INTRAMUSCULAR | Status: DC | PRN
  Administered 2012-01-10: 5 mg via INTRAVENOUS

## 2012-01-10 MED ORDER — ROCURONIUM BROMIDE 100 MG/10ML IV SOLN
INTRAVENOUS | Status: DC | PRN
  Administered 2012-01-10: 50 mg via INTRAVENOUS
  Administered 2012-01-10: 10 mg via INTRAVENOUS

## 2012-01-10 MED ORDER — PROPOFOL 10 MG/ML IV EMUL
INTRAVENOUS | Status: DC | PRN
  Administered 2012-01-10: 200 mg via INTRAVENOUS

## 2012-01-10 MED ORDER — MORPHINE SULFATE (PF) 1 MG/ML IV SOLN
INTRAVENOUS | Status: DC
  Administered 2012-01-10: 22:00:00 via INTRAVENOUS
  Administered 2012-01-11: 7.5 mg via INTRAVENOUS
  Administered 2012-01-11: 13.5 mg via INTRAVENOUS
  Administered 2012-01-11: 05:00:00 via INTRAVENOUS
  Administered 2012-01-11: 16.5 mg via INTRAVENOUS
  Administered 2012-01-11: 13.5 mg via INTRAVENOUS
  Administered 2012-01-11: 7.5 mg via INTRAVENOUS
  Administered 2012-01-12: 4.5 mg via INTRAVENOUS
  Administered 2012-01-12: 3.95 mg via INTRAVENOUS
  Administered 2012-01-12: 3 mg via INTRAVENOUS
  Administered 2012-01-12: 6 mg via INTRAVENOUS
  Administered 2012-01-12: 4.5 mg via INTRAVENOUS
  Administered 2012-01-12 – 2012-01-13 (×2): via INTRAVENOUS
  Administered 2012-01-13 (×2): 7.5 mg via INTRAVENOUS
  Administered 2012-01-14: 3 mg via INTRAVENOUS
  Administered 2012-01-14: 8.45 mg via INTRAVENOUS
  Administered 2012-01-14: 14.4 mg via INTRAVENOUS
  Administered 2012-01-14: 12 mg via INTRAVENOUS
  Administered 2012-01-14: 7.5 mg via INTRAVENOUS
  Administered 2012-01-14: 10.5 mg via INTRAVENOUS
  Administered 2012-01-14: 06:00:00 via INTRAVENOUS
  Administered 2012-01-14: 13.4 mg via INTRAVENOUS
  Administered 2012-01-15: 10.03 mg via INTRAVENOUS
  Administered 2012-01-15: 05:00:00 via INTRAVENOUS

## 2012-01-10 MED ORDER — HYDROMORPHONE HCL PF 1 MG/ML IJ SOLN
0.2500 mg | INTRAMUSCULAR | Status: DC | PRN
Start: 2012-01-10 — End: 2012-01-10
  Administered 2012-01-10 (×4): 0.5 mg via INTRAVENOUS

## 2012-01-10 MED ORDER — DEXAMETHASONE SODIUM PHOSPHATE 4 MG/ML IJ SOLN
INTRAMUSCULAR | Status: DC | PRN
  Administered 2012-01-10: 4 mg via INTRAVENOUS

## 2012-01-10 MED ORDER — SCOPOLAMINE 1 MG/3DAYS TD PT72
1.0000 | MEDICATED_PATCH | TRANSDERMAL | Status: DC
  Filled 2012-01-10: qty 1

## 2012-01-10 MED ORDER — DIPHENHYDRAMINE HCL 12.5 MG/5ML PO ELIX
12.5000 mg | ORAL_SOLUTION | Freq: Four times a day (QID) | ORAL | Status: DC | PRN
  Filled 2012-01-10: qty 5

## 2012-01-10 MED ORDER — POTASSIUM CHLORIDE IN NACL 20-0.9 MEQ/L-% IV SOLN
INTRAVENOUS | Status: DC
  Administered 2012-01-10 – 2012-01-13 (×8): via INTRAVENOUS
  Administered 2012-01-14: 75 mL/h via INTRAVENOUS
  Administered 2012-01-14 – 2012-01-15 (×2): via INTRAVENOUS
  Filled 2012-01-10 (×18): qty 1000

## 2012-01-10 MED ORDER — ONDANSETRON HCL 4 MG PO TABS: 4.0000 mg | ORAL_TABLET | Freq: Four times a day (QID) | ORAL | Status: DC | PRN

## 2012-01-10 MED ORDER — ONDANSETRON HCL 4 MG/2ML IJ SOLN
INTRAMUSCULAR | Status: DC | PRN
  Administered 2012-01-10: 4 mg via INTRAVENOUS

## 2012-01-10 MED ORDER — SODIUM CHLORIDE 0.9 % IJ SOLN: 9.0000 mL | INTRAMUSCULAR | Status: DC | PRN

## 2012-01-10 MED ORDER — PROMETHAZINE HCL 25 MG/ML IJ SOLN
6.2500 mg | INTRAMUSCULAR | Status: AC | PRN
  Administered 2012-01-10 (×2): 6.25 mg via INTRAVENOUS

## 2012-01-10 MED ORDER — KETOROLAC TROMETHAMINE 30 MG/ML IJ SOLN
30.0000 mg | Freq: Four times a day (QID) | INTRAMUSCULAR | Status: AC
  Administered 2012-01-10 – 2012-01-12 (×8): 30 mg via INTRAVENOUS
  Filled 2012-01-10 (×7): qty 1

## 2012-01-10 MED ORDER — LACTATED RINGERS IV SOLN
INTRAVENOUS | Status: DC | PRN
  Administered 2012-01-10 (×3): via INTRAVENOUS

## 2012-01-10 MED ORDER — HYDROMORPHONE HCL PF 1 MG/ML IJ SOLN
INTRAMUSCULAR | Status: AC
  Filled 2012-01-10: qty 1

## 2012-01-10 MED ORDER — MIDAZOLAM HCL 5 MG/5ML IJ SOLN
INTRAMUSCULAR | Status: DC | PRN
  Administered 2012-01-10: 2 mg via INTRAVENOUS

## 2012-01-10 MED ORDER — ENOXAPARIN SODIUM 40 MG/0.4ML ~~LOC~~ SOLN
40.0000 mg | SUBCUTANEOUS | Status: DC
  Administered 2012-01-11 – 2012-01-15 (×5): 40 mg via SUBCUTANEOUS
  Filled 2012-01-10 (×6): qty 0.4

## 2012-01-10 MED ORDER — SCOPOLAMINE 1 MG/3DAYS TD PT72
MEDICATED_PATCH | TRANSDERMAL | Status: DC | PRN
  Administered 2012-01-10: 1 via TRANSDERMAL

## 2012-01-10 MED ORDER — DIPHENHYDRAMINE HCL 50 MG/ML IJ SOLN: 12.5000 mg | Freq: Four times a day (QID) | INTRAMUSCULAR | Status: DC | PRN

## 2012-01-10 MED ORDER — FENTANYL CITRATE 0.05 MG/ML IJ SOLN
INTRAMUSCULAR | Status: DC | PRN
  Administered 2012-01-10: 50 ug via INTRAVENOUS
  Administered 2012-01-10: 100 ug via INTRAVENOUS
  Administered 2012-01-10: 25 ug via INTRAVENOUS
  Administered 2012-01-10: 100 ug via INTRAVENOUS
  Administered 2012-01-10: 25 ug via INTRAVENOUS

## 2012-01-10 MED ORDER — LIDOCAINE HCL (CARDIAC) 20 MG/ML IV SOLN
INTRAVENOUS | Status: DC | PRN
  Administered 2012-01-10: 50 mg via INTRAVENOUS

## 2012-01-10 MED ORDER — BIOTENE DRY MOUTH MT LIQD
15.0000 mL | Freq: Two times a day (BID) | OROMUCOSAL | Status: DC
  Administered 2012-01-11 – 2012-01-15 (×10): 15 mL via OROMUCOSAL

## 2012-01-10 MED ORDER — GLYCOPYRROLATE 0.2 MG/ML IJ SOLN
INTRAMUSCULAR | Status: DC | PRN
  Administered 2012-01-10: .8 mg via INTRAVENOUS

## 2012-01-10 MED ORDER — ONDANSETRON HCL 4 MG/2ML IJ SOLN
INTRAMUSCULAR | Status: AC
  Filled 2012-01-10: qty 2

## 2012-01-10 MED ORDER — ONDANSETRON HCL 4 MG/2ML IJ SOLN
4.0000 mg | Freq: Four times a day (QID) | INTRAMUSCULAR | Status: DC | PRN
  Administered 2012-01-10: 4 mg via INTRAVENOUS

## 2012-01-10 MED ORDER — 0.9 % SODIUM CHLORIDE (POUR BTL) OPTIME
TOPICAL | Status: DC | PRN
  Administered 2012-01-10 (×2): 1000 mL

## 2012-01-10 MED ORDER — NALOXONE HCL 0.4 MG/ML IJ SOLN: 0.4000 mg | INTRAMUSCULAR | Status: DC | PRN

## 2012-01-10 MED ORDER — SODIUM CHLORIDE 0.9 % IV SOLN
1.0000 g | INTRAVENOUS | Status: AC
  Administered 2012-01-11: 1 g via INTRAVENOUS
  Filled 2012-01-10: qty 1

## 2012-01-10 MED ORDER — ONDANSETRON HCL 4 MG/2ML IJ SOLN: 4.0000 mg | Freq: Four times a day (QID) | INTRAMUSCULAR | Status: DC | PRN

## 2012-01-10 MED ORDER — MORPHINE SULFATE (PF) 1 MG/ML IV SOLN
INTRAVENOUS | Status: AC
  Filled 2012-01-10: qty 25

## 2012-01-10 MED ORDER — ONDANSETRON HCL 4 MG/2ML IJ SOLN
4.0000 mg | Freq: Four times a day (QID) | INTRAMUSCULAR | Status: DC | PRN
  Administered 2012-01-11 – 2012-01-14 (×2): 4 mg via INTRAVENOUS

## 2012-01-10 MED ORDER — CHLORHEXIDINE GLUCONATE 0.12 % MT SOLN
15.0000 mL | Freq: Two times a day (BID) | OROMUCOSAL | Status: DC
  Administered 2012-01-11 – 2012-01-16 (×11): 15 mL via OROMUCOSAL
  Filled 2012-01-10 (×11): qty 15

## 2012-01-10 MED ORDER — PROMETHAZINE HCL 25 MG/ML IJ SOLN
INTRAMUSCULAR | Status: AC
  Filled 2012-01-10: qty 1

## 2012-01-10 MED ORDER — HYDROMORPHONE 0.3 MG/ML IV SOLN
INTRAVENOUS | Status: DC
  Administered 2012-01-10: 2.26 mg via INTRAVENOUS
  Administered 2012-01-10: 1.8 mg via INTRAVENOUS
  Administered 2012-01-10: 12:00:00 via INTRAVENOUS

## 2012-01-10 MED ORDER — ONDANSETRON HCL 4 MG/2ML IJ SOLN
4.0000 mg | Freq: Four times a day (QID) | INTRAMUSCULAR | Status: DC | PRN
  Administered 2012-01-15: 4 mg via INTRAVENOUS
  Filled 2012-01-10 (×4): qty 2

## 2012-01-10 MED ORDER — PROMETHAZINE HCL 25 MG/ML IJ SOLN
INTRAMUSCULAR | Status: AC
  Administered 2012-01-10: 6.25 mg via INTRAVENOUS
  Filled 2012-01-10: qty 1

## 2012-01-10 SURGICAL SUPPLY — 38 items
BLADE SURG ROTATE 9660 (MISCELLANEOUS) IMPLANT
CANISTER SUCTION 2500CC (MISCELLANEOUS) ×2 IMPLANT
CLOTH BEACON ORANGE TIMEOUT ST (SAFETY) ×2 IMPLANT
COVER SURGICAL LIGHT HANDLE (MISCELLANEOUS) ×2 IMPLANT
DRAPE LAPAROSCOPIC ABDOMINAL (DRAPES) ×2 IMPLANT
DRAPE WARM FLUID 44X44 (DRAPE) ×2 IMPLANT
ELECT REM PT RETURN 9FT ADLT (ELECTROSURGICAL) ×2
ELECTRODE REM PT RTRN 9FT ADLT (ELECTROSURGICAL) ×1 IMPLANT
GLOVE SURG SIGNA 7.5 PF LTX (GLOVE) ×2 IMPLANT
GOWN PREVENTION PLUS XLARGE (GOWN DISPOSABLE) ×2 IMPLANT
GOWN STRL NON-REIN LRG LVL3 (GOWN DISPOSABLE) ×4 IMPLANT
KIT BASIN OR (CUSTOM PROCEDURE TRAY) ×2 IMPLANT
KIT ROOM TURNOVER OR (KITS) ×2 IMPLANT
LIGASURE IMPACT 36 18CM CVD LR (INSTRUMENTS) ×1 IMPLANT
NS IRRIG 1000ML POUR BTL (IV SOLUTION) ×3 IMPLANT
PACK GENERAL/GYN (CUSTOM PROCEDURE TRAY) ×2 IMPLANT
PAD ARMBOARD 7.5X6 YLW CONV (MISCELLANEOUS) ×4 IMPLANT
RELOAD PROXIMATE 75MM BLUE (ENDOMECHANICALS) ×4 IMPLANT
RELOAD STAPLE 75 3.8 BLU REG (ENDOMECHANICALS) IMPLANT
SPECIMEN JAR LARGE (MISCELLANEOUS) IMPLANT
SPONGE GAUZE 4X4 12PLY (GAUZE/BANDAGES/DRESSINGS) ×2 IMPLANT
SPONGE LAP 18X18 X RAY DECT (DISPOSABLE) IMPLANT
STAPLER GUN LINEAR PROX 60 (STAPLE) ×1 IMPLANT
STAPLER PROXIMATE 75MM BLUE (STAPLE) ×1 IMPLANT
STAPLER VISISTAT 35W (STAPLE) ×2 IMPLANT
SUCTION POOLE TIP (SUCTIONS) IMPLANT
SUT PDS AB 1 TP1 96 (SUTURE) ×4 IMPLANT
SUT SILK 2 0 SH CR/8 (SUTURE) ×2 IMPLANT
SUT SILK 2 0 TIES 10X30 (SUTURE) ×2 IMPLANT
SUT SILK 3 0 SH CR/8 (SUTURE) ×2 IMPLANT
SUT SILK 3 0 TIES 10X30 (SUTURE) ×2 IMPLANT
SUT VIC AB 3-0 SH 18 (SUTURE) IMPLANT
TAPE CLOTH SURG 6X10 WHT LF (GAUZE/BANDAGES/DRESSINGS) ×1 IMPLANT
TOWEL OR 17X24 6PK STRL BLUE (TOWEL DISPOSABLE) ×2 IMPLANT
TOWEL OR 17X26 10 PK STRL BLUE (TOWEL DISPOSABLE) ×2 IMPLANT
TRAY FOLEY CATH 14FRSI W/METER (CATHETERS) IMPLANT
WATER STERILE IRR 1000ML POUR (IV SOLUTION) ×2 IMPLANT
YANKAUER SUCT BULB TIP NO VENT (SUCTIONS) IMPLANT

## 2012-01-10 NOTE — Anesthesia Postprocedure Evaluation (Signed)
  Anesthesia Post-op Note  Patient: Jason [name]  Procedure(s) Performed: Procedure(s) (LRB): EXPLORATORY LAPAROTOMY (N/A) SMALL BOWEL RESECTION (N/A)  Patient Location: PACU  Anesthesia Type: General  Level of Consciousness: sedated  Airway and Oxygen Therapy: Patient Spontanous Breathing and Patient connected to nasal cannula oxygen  Post-op Pain: mild  Post-op Assessment: Post-op Vital signs reviewed, Patient's Cardiovascular Status Stable, Respiratory Function Stable, Patent Airway, No signs of Nausea or vomiting and Pain level controlled  Post-op Vital Signs: Reviewed and stable  Complications: No apparent anesthesia complications

## 2012-01-10 NOTE — Transfer of Care (Signed)
Immediate Anesthesia Transfer of Care Note  Patient: Jason [name]  Procedure(s) Performed: Procedure(s) (LRB): EXPLORATORY LAPAROTOMY (N/A) SMALL BOWEL RESECTION (N/A)  Patient Location: PACU  Anesthesia Type: General  Level of Consciousness: awake, alert  and oriented  Airway & Oxygen Therapy: Patient Spontanous Breathing  Post-op Assessment: Report given to PACU RN and Post -op Vital signs reviewed and stable  Post vital signs: Reviewed and stable  Complications: No apparent anesthesia complications

## 2012-01-10 NOTE — Anesthesia Procedure Notes (Signed)
Procedure Name: Intubation Date/Time: 01/10/2012 10:16 AM Performed by: Luster Landsberg Pre-anesthesia Checklist: Patient identified, Emergency Drugs available, Suction available and Patient being monitored Patient Re-evaluated:Patient Re-evaluated prior to inductionOxygen Delivery Method: Circle system utilized Preoxygenation: Pre-oxygenation with 100% oxygen Intubation Type: IV induction Ventilation: Mask ventilation without difficulty and Oral airway inserted - appropriate to patient size Laryngoscope Size: Mac and 3 Grade View: Grade I Tube type: Oral Tube size: 7.5 mm Number of attempts: 1 Airway Equipment and Method: Stylet Placement Confirmation: ETT inserted through vocal cords under direct vision,  positive ETCO2 and breath sounds checked- equal and bilateral Secured at: 23 cm Tube secured with: Tape Dental Injury: Teeth and Oropharynx as per pre-operative assessment

## 2012-01-10 NOTE — Progress Notes (Signed)
PT c/o severe headache from PCA Dilaudid,md notified,new order received.

## 2012-01-10 NOTE — Interval H&P Note (Signed)
History and Physical Interval Note:  He has had no change in his history or exam  01/10/2012 9:15 AM  Jason [name]  has presented today for surgery, with the diagnosis of abdominal pain  The various methods of treatment have been discussed with the patient and family. After consideration of risks, benefits and other options for treatment, the patient has consented to  Procedure(s) (LRB): EXPLORATORY LAPAROTOMY (N/A) as a surgical intervention .  The patients' history has been reviewed, patient examined, no change in status, stable for surgery.  I have reviewed the patients' chart and labs.  Questions were answered to the patient's satisfaction.     Akira Perusse A

## 2012-01-10 NOTE — Anesthesia Preprocedure Evaluation (Signed)
Anesthesia Evaluation  Patient identified by MRN, date of birth, ID band Patient awake    Reviewed: Allergy & Precautions, H&P , NPO status , Patient's Chart, lab work & pertinent test results, reviewed documented beta blocker date and time   History of Anesthesia Complications (+) PONV  Airway Mallampati: I TM Distance: >3 FB Neck ROM: Full    Dental No notable dental hx. (+) Teeth Intact and Dental Advisory Given   Pulmonary neg pulmonary ROS, Recent URI , Resolved,  breath sounds clear to auscultation  Pulmonary exam normal       Cardiovascular hypertension, Pt. on medications and Pt. on home beta blockers Rhythm:Regular Rate:Normal     Neuro/Psych Anxiety  Neuromuscular disease    GI/Hepatic Neg liver ROS, hiatal hernia, PUD (bleeding peptic ulcer 3/12), GERD-  Controlled and Medicated,N/V with bowel obstruction   Endo/Other  negative endocrine ROS  Renal/GU negative Renal ROS     Musculoskeletal   Abdominal (+) + obese,   Peds  Hematology negative hematology ROS (+)   Anesthesia Other Findings   Reproductive/Obstetrics                           Anesthesia Physical Anesthesia Plan  ASA: II  Anesthesia Plan: General   Post-op Pain Management:    Induction: Intravenous  Airway Management Planned: Oral ETT  Additional Equipment:   Intra-op Plan:   Post-operative Plan: Extubation in OR  Informed Consent: I have reviewed the patients History and Physical, chart, labs and discussed the procedure including the risks, benefits and alternatives for the proposed anesthesia with the patient or authorized representative who has indicated his/her understanding and acceptance.   Dental advisory given  Plan Discussed with: CRNA and Surgeon  Anesthesia Plan Comments: (Plan routine monitors, GETA)        Anesthesia Quick Evaluation

## 2012-01-10 NOTE — Addendum Note (Signed)
Addendum  created 01/10/12 1307 by Germaine Pomfret, MD   Modules edited:Orders

## 2012-01-10 NOTE — Op Note (Signed)
EXPLORATORY LAPAROTOMY, SMALL BOWEL RESECTION  Procedure Note  Jason [name] 01/10/2012   Pre-op Diagnosis: chronic abdominal pain unknown etiology     Post-op Diagnosis: same  Procedure(s): EXPLORATORY LAPAROTOMY SMALL BOWEL RESECTION  Surgeon(s): Shelly Rubenstein, MD  Anesthesia: General  Staff:  Doy Mince, RN - Circulator Janeece Agee Pingue, CST - Scrub Person Gerre Pebbles Sipsis, RN - Circulator Assistant Gerre Pebbles Sipsis, RN - Scrub Person  Estimated Blood Loss: Minimal               Specimens: small bowel         Indications: This is [name] 46 year old gentleman who presents with chronic abdominal pain nausea and bloating. He has had [name] previous left ventral hernia repair with mesh. During [name] previous diagnostic laparoscopy he was found to have what appeared to be inflammatory process of the small bowel. His workup from the gastroenterologist has been inconclusive. His workup remains negative. Because of his persistent symptoms and findings on previous laparoscopy, the decision was made to proceed with exploratory laparotomy.  Findings: He was found to have an intense inflammatory reaction for almost 2 feet of the small intestines. The walls edematous with multiple areas of scarring. His proximal small bowel and distal small bowel were normal in appearance. The decision was made to resect this area.  Procedure: The patient was brought to the operating room and identified as the correct patient. He was placed on the operating room table and general anesthesia was induced. His abdomen was then prepped and draped in the usual sterile fashion. [name] midline incision was then created from above to just below the umbilicus. This was carried down to the fascia with electrocautery. Identified the mesh and open up the midline with the cautery and suture scissors. There were no adhesions to the mesh. I eviscerated the small bowel. There was an intense inflammatory and  scarring process from the mid to distal small bowel. I was able to run entirely the small bowel and his proximal and distal small bowel to this area appeared normal. The mesentery also appeared normal. The abnormal segment was proximally 2 feet in length. There were several adhesions of loops of bowel itself. It was difficult to tell whether this represented [name] process or even [name]. The rest of the abdomen is normal. The colon and gallbladder were also normal. At this point, I decided to resect this abnormal appearing segment of small intestines. I transected the proximal and distal with [name] GIA-75 stapler. I then did down the mesentery with the LigaSure. The specimen was then sent to pathology for evaluation. I then reapproximated the small bowel in [name] side-to-side fashion with silk sutures. I created 2 enterotomies with the cautery and then performed [name] side-to-side anastomosis with the GIA-75 stapler. I closed the opening with [name] TA 60 stapler. I then closed the mesenteric defect with interrupted silk sutures. I also reinforced the suture line with interrupted silk sutures. [name] wide well-perfused anastomosis appeared be achieved. I then thoroughly irrigated out the normal saline. Hemostasis appeared be achieved. I then closed the fascia and previous mesh with [name] running #1 looped PDS suture. The skin was then irrigated and closed with skin staples. The patient tolerated the procedure well. All the calcium corrected in the procedure. The patient was then extubated in the the operating room and taken in stable condition to the recovery room. Jason [name]   Date: 01/10/2012  Time: 11:34 AM

## 2012-01-11 LAB — BASIC METABOLIC PANEL
BUN: 12 mg/dL (ref 6–23)
CO2: 28 mEq/L (ref 19–32)
Calcium: 8.8 mg/dL (ref 8.4–10.5)
Chloride: 103 mEq/L (ref 96–112)
Creatinine, Ser: 1.08 mg/dL (ref 0.50–1.35)

## 2012-01-11 LAB — CBC
HCT: 36.1 % — ABNORMAL LOW (ref 39.0–52.0)
MCH: 29.4 pg (ref 26.0–34.0)
MCHC: 33.8 g/dL (ref 30.0–36.0)
MCV: 87 fL (ref 78.0–100.0)
Platelets: 178 10*3/uL (ref 150–400)
RDW: 13.1 % (ref 11.5–15.5)

## 2012-01-11 MED ORDER — MORPHINE SULFATE (PF) 1 MG/ML IV SOLN
INTRAVENOUS | Status: AC
  Filled 2012-01-11: qty 25

## 2012-01-11 MED ORDER — MORPHINE SULFATE (PF) 1 MG/ML IV SOLN
INTRAVENOUS | Status: AC
  Administered 2012-01-11: 5.81 mg via INTRAVENOUS
  Filled 2012-01-11: qty 25

## 2012-01-11 MED ORDER — ACETAMINOPHEN 10 MG/ML IV SOLN
1000.0000 mg | Freq: Once | INTRAVENOUS | Status: AC
  Administered 2012-01-11: 1000 mg via INTRAVENOUS
  Filled 2012-01-11: qty 100

## 2012-01-11 NOTE — Progress Notes (Signed)
Orthopedic Tech Progress Note Patient Details:  Jason [name] 1966/05/16 [id number]  Other Ortho Devices Type of Ortho Device: Other (comment) Ortho Device Interventions: Application Delivered abdominal binder.  Leo Grosser T 01/11/2012, 12:33 PM

## 2012-01-11 NOTE — Progress Notes (Signed)
1 Day Post-Op Exploratory laparotomy with small bowel resection Subjective: Complaining of headache.  Pain in abdomen is controlled with PCA.    Objective: Vital signs in last 24 hours: Temp:  [97.7 F (36.5 C)-99.3 F (37.4 C)] 98.2 F (36.8 C) (04/13 0700) Pulse Rate:  [53-82] 82  (04/13 0700) Resp:  [7-91] 20  (04/13 0856) BP: (113-138)/(52-83) 134/78 mmHg (04/13 0700) SpO2:  [92 %-99 %] 94 % (04/13 0856) Weight:  [212 lb (96.163 kg)] 212 lb (96.163 kg) (04/12 1503)    Intake/Output from previous day: 04/12 0701 - 04/13 0700 In: 4382 [I.V.:4382] Out: 3052 [Urine:2950; Emesis/NG output:2; Blood:100] Intake/Output this shift:    General appearance: alert, cooperative and no distress GI: distended; no bowel sounds Dressing dry  Lab Results:   Basename 01/11/12 0805  WBC 10.2  HGB 12.2*  HCT 36.1*  PLT 178   BMET  Basename 01/11/12 0805  NA 139  K 4.3  CL 103  CO2 28  GLUCOSE 100*  BUN 12  CREATININE 1.08  CALCIUM 8.8   PT/INR No results found for this basename: LABPROT:2,INR:2 in the last 72 hours ABG No results found for this basename: PHART:2,PCO2:2,PO2:2,HCO3:2 in the last 72 hours  Studies/Results: No results found.  Anti-infectives: Anti-infectives     Start     Dose/Rate Route Frequency Ordered Stop   01/11/12 1000   ertapenem (INVANZ) 1 g in sodium chloride 0.9 % 50 mL IVPB        1 g 100 mL/hr over 30 Minutes Intravenous Every 24 hours 01/10/12 1511 01/11/12 1111   01/09/12 1425   ertapenem (INVANZ) 1 g in sodium chloride 0.9 % 50 mL IVPB        1 g 100 mL/hr over 30 Minutes Intravenous 60 min pre-op 01/09/12 1425 01/10/12 1018          Assessment/Plan: s/p Procedure(s) (LRB): EXPLORATORY LAPAROTOMY (N/A) SMALL BOWEL RESECTION (N/A) Foley removed - voiding; ambulate as tolerated.  LOS: 1 day    Dalvin Clipper K. 01/11/2012

## 2012-01-12 MED ORDER — MORPHINE SULFATE (PF) 1 MG/ML IV SOLN
INTRAVENOUS | Status: AC
  Administered 2012-01-12: 06:00:00
  Filled 2012-01-12: qty 25

## 2012-01-12 MED ORDER — PHENOL 1.4 % MT LIQD
1.0000 | OROMUCOSAL | Status: DC | PRN
  Administered 2012-01-12: 1 via OROMUCOSAL
  Filled 2012-01-12 (×2): qty 177

## 2012-01-12 MED ORDER — ACETAMINOPHEN 10 MG/ML IV SOLN
1000.0000 mg | Freq: Four times a day (QID) | INTRAVENOUS | Status: AC
  Administered 2012-01-12 – 2012-01-13 (×4): 1000 mg via INTRAVENOUS
  Filled 2012-01-12 (×4): qty 100

## 2012-01-12 MED ORDER — MORPHINE SULFATE (PF) 1 MG/ML IV SOLN
INTRAVENOUS | Status: AC
  Administered 2012-01-12: 17.91 mg via INTRAVENOUS
  Filled 2012-01-12: qty 25

## 2012-01-12 NOTE — Progress Notes (Signed)
2 Days Post-Op  Subjective: Still c/o headache - better with IV tylenol No bowel activity  Objective: Vital signs in last 24 hours: Temp:  [98.3 F (36.8 C)-98.7 F (37.1 C)] 98.7 F (37.1 C) (04/14 0553) Pulse Rate:  [71-87] 77  (04/14 0553) Resp:  [18-28] 28  (04/14 0812) BP: (112-137)/(62-72) 131/72 mmHg (04/14 0553) SpO2:  [92 %-98 %] 96 % (04/14 0812) Last BM Date: 01/09/12  Intake/Output from previous day: 04/13 0701 - 04/14 0700 In: 3027.1 [I.V.:3027.1] Out: 2300 [Urine:1900; Emesis/NG output:400] Intake/Output this shift: Total I/O In: -  Out: 300 [Urine:300]  General appearance: alert, cooperative and no distress GI: distended; minimal bowel sounds Incision c/d/i  Lab Results:   Basename 01/11/12 0805  WBC 10.2  HGB 12.2*  HCT 36.1*  PLT 178   BMET  Basename 01/11/12 0805  NA 139  K 4.3  CL 103  CO2 28  GLUCOSE 100*  BUN 12  CREATININE 1.08  CALCIUM 8.8   PT/INR No results found for this basename: LABPROT:2,INR:2 in the last 72 hours ABG No results found for this basename: PHART:2,PCO2:2,PO2:2,HCO3:2 in the last 72 hours  Studies/Results: No results found.  Anti-infectives: Anti-infectives     Start     Dose/Rate Route Frequency Ordered Stop   01/11/12 1000   ertapenem (INVANZ) 1 g in sodium chloride 0.9 % 50 mL IVPB        1 g 100 mL/hr over 30 Minutes Intravenous Every 24 hours 01/10/12 1511 01/11/12 1111   01/09/12 1425   ertapenem (INVANZ) 1 g in sodium chloride 0.9 % 50 mL IVPB        1 g 100 mL/hr over 30 Minutes Intravenous 60 min pre-op 01/09/12 1425 01/10/12 1018          Assessment/Plan: s/p Procedure(s) (LRB): EXPLORATORY LAPAROTOMY (N/A) SMALL BOWEL RESECTION (N/A) Chloraseptic PRN IV Ofirmev x 4 doses Awaiting bowel function   LOS: 2 days    Mizani Dilday K. 01/12/2012

## 2012-01-13 ENCOUNTER — Encounter (HOSPITAL_COMMUNITY): Payer: Self-pay | Admitting: Surgery

## 2012-01-13 MED ORDER — MORPHINE SULFATE (PF) 1 MG/ML IV SOLN
INTRAVENOUS | Status: AC
  Administered 2012-01-13: 20:00:00
  Filled 2012-01-13: qty 25

## 2012-01-13 MED ORDER — MORPHINE SULFATE (PF) 1 MG/ML IV SOLN
INTRAVENOUS | Status: AC
  Filled 2012-01-13: qty 25

## 2012-01-13 NOTE — Progress Notes (Signed)
3 Days Post-Op  Subjective: POD#3 Complains of discomfort from NG No flatus  Objective: Vital signs in last 24 hours: Temp:  [98 F (36.7 C)-98.8 F (37.1 C)] 98.2 F (36.8 C) (04/15 0517) Pulse Rate:  [68-84] 68  (04/15 0517) Resp:  [18-28] 18  (04/15 0517) BP: (126-133)/(70-81) 133/81 mmHg (04/15 0517) SpO2:  [93 %-98 %] 98 % (04/15 0517) Last BM Date: 01/09/12  Intake/Output from previous day: 04/14 0701 - 04/15 0700 In: 2948.9 [I.V.:2947.9] Out: 3000 [Urine:1550; Emesis/NG output:1450] Intake/Output this shift:    Abdomen non distended Incision clean Rare BS  Lab Results:   [facility] 01/11/12 0805  WBC 10.2  HGB 12.2*  HCT 36.1*  PLT 178   BMET  Basename 01/11/12 0805  NA 139  K 4.3  CL 103  CO2 28  GLUCOSE 100*  BUN 12  CREATININE 1.08  CALCIUM 8.8   PT/INR No results found for this basename: LABPROT:2,INR:2 in the last 72 hours ABG No results found for this basename: PHART:2,PCO2:2,PO2:2,HCO3:2 in the last 72 hours  Studies/Results: No results found.  Anti-infectives: Anti-infectives     Start     Dose/Rate Route Frequency Ordered Stop   01/11/12 1000   ertapenem (INVANZ) 1 g in sodium chloride 0.9 % 50 mL IVPB        1 g 100 mL/hr over 30 Minutes Intravenous Every 24 hours 01/10/12 1511 01/11/12 1111   01/09/12 1425   ertapenem (INVANZ) 1 g in sodium chloride 0.9 % 50 mL IVPB        1 g 100 mL/hr over 30 Minutes Intravenous 60 min pre-op 01/09/12 1425 01/10/12 1018          Assessment/Plan: s/p Procedure(s) (LRB): EXPLORATORY LAPAROTOMY (N/A) SMALL BOWEL RESECTION (N/A)  D/c NG tube Keep on ice chips  LOS: 3 days    Jason [name] A 01/13/2012

## 2012-01-14 MED ORDER — MORPHINE SULFATE (PF) 1 MG/ML IV SOLN
INTRAVENOUS | Status: AC
  Administered 2012-01-14: 06:00:00
  Filled 2012-01-14: qty 25

## 2012-01-14 MED ORDER — PROMETHAZINE HCL 25 MG/ML IJ SOLN: 12.5000 mg | Freq: Four times a day (QID) | INTRAMUSCULAR | Status: DC | PRN

## 2012-01-14 MED ORDER — MORPHINE SULFATE (PF) 1 MG/ML IV SOLN
INTRAVENOUS | Status: AC
  Filled 2012-01-14: qty 25

## 2012-01-14 NOTE — Progress Notes (Signed)
4 Days Post-Op  Subjective: +flatus  Objective: Vital signs in last 24 hours: Temp:  [98.1 F (36.7 C)-98.8 F (37.1 C)] 98.5 F (36.9 C) (04/16 0555) Pulse Rate:  [76-98] 80  (04/16 0555) Resp:  [15-26] 16  (04/16 0555) BP: (102-140)/(55-78) 102/55 mmHg (04/16 0555) SpO2:  [95 %-99 %] 98 % (04/16 0555) Last BM Date: 01/09/12  Intake/Output from previous day: 04/15 0701 - 04/16 0700 In: 2291 [I.V.:2291] Out: 3275 [Urine:3075; Emesis/NG output:200] Intake/Output this shift: Total I/O In: 1125 [I.V.:1125] Out: 1750 [Urine:1750]  Abdomen soft, incision clean  Lab Results:   [facility] 01/11/12 0805  WBC 10.2  HGB 12.2*  HCT 36.1*  PLT 178   BMET  Basename 01/11/12 0805  NA 139  K 4.3  CL 103  CO2 28  GLUCOSE 100*  BUN 12  CREATININE 1.08  CALCIUM 8.8   PT/INR No results found for this basename: LABPROT:2,INR:2 in the last 72 hours ABG No results found for this basename: PHART:2,PCO2:2,PO2:2,HCO3:2 in the last 72 hours  Studies/Results: No results found.  Anti-infectives: Anti-infectives     Start     Dose/Rate Route Frequency Ordered Stop   01/11/12 1000   ertapenem (INVANZ) 1 g in sodium chloride 0.9 % 50 mL IVPB        1 g 100 mL/hr over 30 Minutes Intravenous Every 24 hours 01/10/12 1511 01/11/12 1111   01/09/12 1425   ertapenem (INVANZ) 1 g in sodium chloride 0.9 % 50 mL IVPB        1 g 100 mL/hr over 30 Minutes Intravenous 60 min pre-op 01/09/12 1425 01/10/12 1018          Assessment/Plan: s/p Procedure(s) (LRB): EXPLORATORY LAPAROTOMY (N/A) SMALL BOWEL RESECTION (N/A)  Advance PO Pathology fairly unrevealing with no evidence of Crohns.  LOS: 4 days    Quadry Kampa A 01/14/2012

## 2012-01-15 MED ORDER — METOPROLOL SUCCINATE ER 25 MG PO TB24
25.0000 mg | ORAL_TABLET | Freq: Every day | ORAL | Status: DC
  Administered 2012-01-15: 25 mg via ORAL
  Filled 2012-01-15 (×2): qty 1

## 2012-01-15 MED ORDER — MORPHINE SULFATE (PF) 1 MG/ML IV SOLN
INTRAVENOUS | Status: AC
  Filled 2012-01-15: qty 25

## 2012-01-15 MED ORDER — MORPHINE SULFATE 2 MG/ML IJ SOLN
2.0000 mg | INTRAMUSCULAR | Status: DC | PRN
  Administered 2012-01-15 (×3): 2 mg via INTRAVENOUS
  Filled 2012-01-15 (×3): qty 1

## 2012-01-15 MED ORDER — OXYCODONE-ACETAMINOPHEN 5-325 MG PO TABS
1.0000 | ORAL_TABLET | ORAL | Status: DC | PRN
  Administered 2012-01-15: 1 via ORAL
  Filled 2012-01-15: qty 1

## 2012-01-15 MED ORDER — MORPHINE SULFATE (PF) 1 MG/ML IV SOLN: Freq: Once | INTRAVENOUS | Status: DC

## 2012-01-15 NOTE — Discharge Instructions (Signed)
[facility]      [facility] [phone]  OPEN ABDOMINAL SURGERY: POST OP INSTRUCTIONS  Always review your discharge instruction sheet given to you by the facility where your surgery was performed.  IF YOU HAVE DISABILITY OR FAMILY LEAVE FORMS, YOU MUST BRING THEM TO THE OFFICE FOR PROCESSING.  PLEASE DO NOT GIVE THEM TO YOUR DOCTOR.  1. A prescription for pain medication may be given to you upon discharge.  Take your pain medication as prescribed, if needed.  If narcotic pain medicine is not needed, then you may take acetaminophen (Tylenol) or ibuprofen (Advil) as needed. 2. Take your usually prescribed medications unless otherwise directed. 3. If you need a refill on your pain medication, please contact your pharmacy. They will contact our office to request authorization.  Prescriptions will not be filled after 5pm or on week-ends. 4. You should follow a light diet the first few days after arrival home, such as soup and crackers, pudding, etc.unless your doctor has advised otherwise. A high-fiber, low fat diet can be resumed as tolerated.   Be sure to include lots of fluids daily. Most patients will experience some swelling and bruising on the chest and neck area.  Ice packs will help.  Swelling and bruising can take several days to resolve 5. Most patients will experience some swelling and bruising in the area of the incision. Ice pack will help. Swelling and bruising can take several days to resolve..  6. It is common to experience some constipation if taking pain medication after surgery.  Increasing fluid intake and taking a stool softener will usually help or prevent this problem from occurring.  A mild laxative (Milk of Magnesia or Miralax) should be taken according to package directions if there are no bowel movements after 48 hours. 7.  You may have steri-strips (small skin tapes) in place directly over the incision.  These strips should be left on the skin for 7-10 days.  If your  surgeon used skin glue on the incision, you may shower in 24 hours.  The glue will flake off over the next 2-3 weeks.  Any sutures or staples will be removed at the office during your follow-up visit. You may find that a light gauze bandage over your incision may keep your staples from being rubbed or pulled. You may shower and replace the bandage daily. 8. ACTIVITIES:  You may resume regular (light) daily activities beginning the next day--such as daily self-care, walking, climbing stairs--gradually increasing activities as tolerated.  You may have sexual intercourse when it is comfortable.  Refrain from any heavy lifting or straining until approved by your doctor. a. You may drive when you no longer are taking prescription pain medication, you can comfortably wear a seatbelt, and you can safely maneuver your car and apply brakes b. Return to Work: ___________________________________ 9. You should see your doctor in the office for a follow-up appointment approximately two weeks after your surgery.  Make sure that you call for this appointment within a day or two after you arrive home to insure a convenient appointment time. OTHER INSTRUCTIONS:  _____________________________________________________________ _____________________________________________________________  WHEN TO CALL YOUR DOCTOR: 1. Fever over 101.0 2. Inability to urinate 3. Nausea and/or vomiting 4. Extreme swelling or bruising 5. Continued bleeding from incision. 6. Increased pain, redness, or drainage from the incision. 7. Difficulty swallowing or breathing 8. Muscle cramping or spasms. 9. Numbness or tingling in hands or feet or around lips.  The clinic staff is available to   answer your questions during regular business hours.  Please don't hesitate to call and ask to speak to one of the nurses if you have concerns.  For further questions, please visit [email or web address]   

## 2012-01-15 NOTE — Progress Notes (Signed)
5 Days Post-Op  Subjective: POD#5 +BM Tolerating liquids  Objective: Vital signs in last 24 hours: Temp:  [98.2 F (36.8 C)-98.5 F (36.9 C)] 98.4 F (36.9 C) (04/17 0559) Pulse Rate:  [56-108] 79  (04/17 0559) Resp:  [12-27] 12  (04/17 0559) BP: (119-126)/(70-76) 121/70 mmHg (04/17 0559) SpO2:  [63 %-99 %] 96 % (04/17 0559) Last BM Date: 01/14/12  Intake/Output from previous day: 04/16 0701 - 04/17 0700 In: 1673 [I.V.:1673] Out: 1875 [Urine:1875] Intake/Output this shift: Total I/O In: 913 [I.V.:913] Out: 600 [Urine:600]  Abdomen soft, non distended, incision clean  Lab Results:  No results found for this basename: WBC:2,HGB:2,HCT:2,PLT:2 in the last 72 hours BMET No results found for this basename: NA:2,K:2,CL:2,CO2:2,GLUCOSE:2,BUN:2,CREATININE:2,CALCIUM:2 in the last 72 hours PT/INR No results found for this basename: LABPROT:2,INR:2 in the last 72 hours ABG No results found for this basename: PHART:2,PCO2:2,PO2:2,HCO3:2 in the last 72 hours  Studies/Results: No results found.  Anti-infectives: Anti-infectives     Start     Dose/Rate Route Frequency Ordered Stop   01/11/12 1000   ertapenem (INVANZ) 1 g in sodium chloride 0.9 % 50 mL IVPB        1 g 100 mL/hr over 30 Minutes Intravenous Every 24 hours 01/10/12 1511 01/11/12 1111   01/09/12 1425   ertapenem (INVANZ) 1 g in sodium chloride 0.9 % 50 mL IVPB        1 g 100 mL/hr over 30 Minutes Intravenous 60 min pre-op 01/09/12 1425 01/10/12 1018          Assessment/Plan: s/p Procedure(s) (LRB): EXPLORATORY LAPAROTOMY (N/A) SMALL BOWEL RESECTION (N/A)  Advance po D/C PCA  LOS: 5 days    Deyanira Fesler A 01/15/2012

## 2012-01-15 NOTE — Progress Notes (Signed)
Cleared Morphine 3mg  from PCA pump before discontinuing.

## 2012-01-16 MED ORDER — OXYCODONE-ACETAMINOPHEN 5-325 MG PO TABS: 1.0000 | ORAL_TABLET | ORAL | Status: AC | PRN

## 2012-01-16 NOTE — Progress Notes (Signed)
Patient ID: Jason [name], male   DOB: 1966-05-28, 46 y.o.   MRN: [id number] POD#6  Doing well  Abdomen soft  Plan: discharge

## 2012-01-16 NOTE — Discharge Summary (Signed)
Physician Discharge Summary  Patient ID: Jason [name] MRN: [id number] DOB/AGE: 1966-02-27 46 y.o.  Admit date: 01/10/2012 Discharge date: 01/16/2012  Admission Diagnoses:  Chronic abdominal pain   Discharge Diagnoses: same Active Problems:  * No active hospital problems. *    Discharged Condition: good  Hospital Course: admitted.  Taken to OR for exp lap.  Progressed well post op.  Diet advanced.  Bowel function returned.  Ready for discharge POD#6  Consults: None  Significant Diagnostic Studies:   Treatments: expl lap with small bowel resection  Discharge Exam: Blood pressure 108/63, pulse 72, temperature 98.2 F (36.8 C), temperature source Oral, resp. rate 18, height 5\' 11"  (1.803 m), weight 212 lb (96.163 kg), SpO2 94.00%. General appearance: alert and no distress GI: soft, non-tender; bowel sounds normal; no masses,  no organomegaly Incision/Wound:clean  Disposition: 01-Home or Self Care  Discharge Orders    Future Appointments: Provider: Department: Dept Phone: Center:   01/28/2012 9:00 AM Shelly Rubenstein, MD [facility] [phone] None     Medication List  As of 01/16/2012  6:36 AM   TAKE these medications         acetaminophen 500 MG tablet   Commonly known as: TYLENOL   Take 1,000 mg by mouth every 6 (six) hours as needed. For pain      clidinium-chlordiazePOXIDE 2.5-5 MG per capsule   Commonly known as: LIBRAX   Take 1 capsule by mouth 3 (three) times daily with meals.      esomeprazole 40 MG capsule   Commonly known as: NEXIUM   Take 40 mg by mouth 2 (two) times daily.      metoprolol succinate 25 MG 24 hr tablet   Commonly known as: TOPROL-XL   Take 25 mg by mouth daily before breakfast.      oxyCODONE-acetaminophen 5-325 MG per tablet   Commonly known as: PERCOCET   Take 1 tablet by mouth every 6 (six) hours as needed. For pain.      oxyCODONE-acetaminophen 5-325 MG per tablet   Commonly known as: PERCOCET   Take 1-2 tablets by mouth  every 4 (four) hours as needed.      ZOFRAN 4 MG tablet   Generic drug: ondansetron   Take 4 mg by mouth as needed. For nausea.           Follow-up Information    Follow up with Shelly Rubenstein, MD on 01/29/2012. [phone])    Contact information:   [facility] [address] [place] [address] [phone]          Signed: Shelly Rubenstein 01/16/2012, 6:36 AM

## 2012-01-28 ENCOUNTER — Encounter (INDEPENDENT_AMBULATORY_CARE_PROVIDER_SITE_OTHER): Payer: Self-pay | Admitting: Surgery

## 2012-01-28 ENCOUNTER — Ambulatory Visit (INDEPENDENT_AMBULATORY_CARE_PROVIDER_SITE_OTHER): Payer: Managed Care, Other (non HMO) | Admitting: Surgery

## 2012-01-28 VITALS — BP 104/68 | HR 82 | Temp 98.6°F | Resp 16 | Ht 71.0 in | Wt 195.4 lb

## 2012-01-28 DIAGNOSIS — Z09 Encounter for follow-up examination after completed treatment for conditions other than malignant neoplasm: Secondary | ICD-10-CM

## 2012-01-28 NOTE — Progress Notes (Signed)
Subjective:     Patient ID: Jason [name], male   DOB: 04-Apr-1966, 46 y.o.   MRN: [id number]  HPI He is here for his first postop visit. He is having loose bowel movements. His nausea is improving.  Review of Systems     Objective:   Physical Exam His incision is well-healed. We removed  the staples and place Steri-Strips. His abdomen is soft and nontender and nondistended    Assessment:     Patient status post exploratory laparotomy and small bowel resection    Plan:     Again, the pathology showed benign small bowel mucosa fibrous serosal adhesions with subserosal vascular congestion and soft tissue edema. This was negative for malignancy or dysplasia or evidence of Crohn's. Hopefully this will resolve his problems. I will see him back in 4 weeks. He will still refrain from heavy lifting.

## 2012-02-25 ENCOUNTER — Ambulatory Visit (INDEPENDENT_AMBULATORY_CARE_PROVIDER_SITE_OTHER): Payer: Managed Care, Other (non HMO) | Admitting: Surgery

## 2012-02-25 ENCOUNTER — Encounter (INDEPENDENT_AMBULATORY_CARE_PROVIDER_SITE_OTHER): Payer: Self-pay | Admitting: Surgery

## 2012-02-25 VITALS — BP 118/62 | HR 76 | Temp 96.8°F | Resp 12 | Ht 71.0 in | Wt 199.0 lb

## 2012-02-25 DIAGNOSIS — Z09 Encounter for follow-up examination after completed treatment for conditions other than malignant neoplasm: Secondary | ICD-10-CM

## 2012-02-25 NOTE — Progress Notes (Signed)
Subjective:     Patient ID: Jason [name], male   DOB: [birth year], 46 y.o.   MRN: [id number]  HPI He is here for another postoperative visit. He is currently symptom free and doing well. He is eating well and moving his bowels well  Review of Systems     Objective:   Physical Exam    He looks great. His abdomen is soft and nontender and his incision is well-healed Assessment:     Assessment: Patient status post exploratory laparotomy and bowel resection for inflammation and fibrinous process in the small intestines with symptoms now resolved    Plan:     He will resume all normal activities. I will see him back in 6 months. We will then determine whether he needs to see a gastroenterologist again.

## 2012-07-29 ENCOUNTER — Encounter (INDEPENDENT_AMBULATORY_CARE_PROVIDER_SITE_OTHER): Payer: Self-pay | Admitting: Surgery

## 2012-11-02 ENCOUNTER — Encounter (HOSPITAL_COMMUNITY): Payer: Self-pay | Admitting: Emergency Medicine

## 2012-11-02 ENCOUNTER — Encounter: Payer: Self-pay | Admitting: Family Medicine

## 2012-11-02 ENCOUNTER — Emergency Department (HOSPITAL_COMMUNITY): Payer: BC Managed Care – PPO

## 2012-11-02 ENCOUNTER — Ambulatory Visit (INDEPENDENT_AMBULATORY_CARE_PROVIDER_SITE_OTHER): Payer: BC Managed Care – PPO | Admitting: Family Medicine

## 2012-11-02 ENCOUNTER — Ambulatory Visit: Payer: BC Managed Care – PPO

## 2012-11-02 ENCOUNTER — Emergency Department (HOSPITAL_COMMUNITY)
Admission: EM | Admit: 2012-11-02 | Discharge: 2012-11-02 | Disposition: A | Discharge status: Home / Self Care | Payer: BC Managed Care – PPO | Attending: Emergency Medicine | Admitting: Emergency Medicine

## 2012-11-02 VITALS — BP 152/86 | HR 109 | Temp 98.2°F | Resp 16 | Ht 70.5 in | Wt 210.0 lb

## 2012-11-02 DIAGNOSIS — R079 Chest pain, unspecified: Secondary | ICD-10-CM

## 2012-11-02 DIAGNOSIS — M79606 Pain in leg, unspecified: Secondary | ICD-10-CM

## 2012-11-02 DIAGNOSIS — I10 Essential (primary) hypertension: Secondary | ICD-10-CM

## 2012-11-02 DIAGNOSIS — R0602 Shortness of breath: Secondary | ICD-10-CM

## 2012-11-02 DIAGNOSIS — R000 Tachycardia, unspecified: Secondary | ICD-10-CM

## 2012-11-02 DIAGNOSIS — Z8719 Personal history of other diseases of the digestive system: Secondary | ICD-10-CM | POA: Insufficient documentation

## 2012-11-02 DIAGNOSIS — Z8659 Personal history of other mental and behavioral disorders: Secondary | ICD-10-CM | POA: Insufficient documentation

## 2012-11-02 DIAGNOSIS — M79609 Pain in unspecified limb: Secondary | ICD-10-CM | POA: Insufficient documentation

## 2012-11-02 DIAGNOSIS — R0789 Other chest pain: Secondary | ICD-10-CM | POA: Insufficient documentation

## 2012-11-02 DIAGNOSIS — Z8709 Personal history of other diseases of the respiratory system: Secondary | ICD-10-CM | POA: Insufficient documentation

## 2012-11-02 DIAGNOSIS — Z862 Personal history of diseases of the blood and blood-forming organs and certain disorders involving the immune mechanism: Secondary | ICD-10-CM | POA: Insufficient documentation

## 2012-11-02 DIAGNOSIS — Z8639 Personal history of other endocrine, nutritional and metabolic disease: Secondary | ICD-10-CM | POA: Insufficient documentation

## 2012-11-02 DIAGNOSIS — Z8711 Personal history of peptic ulcer disease: Secondary | ICD-10-CM | POA: Insufficient documentation

## 2012-11-02 DIAGNOSIS — Z8739 Personal history of other diseases of the musculoskeletal system and connective tissue: Secondary | ICD-10-CM | POA: Insufficient documentation

## 2012-11-02 DIAGNOSIS — R109 Unspecified abdominal pain: Secondary | ICD-10-CM

## 2012-11-02 DIAGNOSIS — M129 Arthropathy, unspecified: Secondary | ICD-10-CM | POA: Insufficient documentation

## 2012-11-02 DIAGNOSIS — Z8701 Personal history of pneumonia (recurrent): Secondary | ICD-10-CM | POA: Insufficient documentation

## 2012-11-02 LAB — CBC
HCT: 40.9 % (ref 39.0–52.0)
MCH: 28.9 pg (ref 26.0–34.0)
MCHC: 35 g/dL (ref 30.0–36.0)
MCV: 82.8 fL (ref 78.0–100.0)
Platelets: 223 10*3/uL (ref 150–400)
RDW: 13.7 % (ref 11.5–15.5)
WBC: 6.5 10*3/uL (ref 4.0–10.5)

## 2012-11-02 LAB — HEPATIC FUNCTION PANEL
Albumin: 4.1 g/dL (ref 3.5–5.2)
Alkaline Phosphatase: 97 U/L (ref 39–117)
Bilirubin, Direct: <0.1 mg/dL (ref 0.0–0.3)
Total Bilirubin: 0.2 mg/dL — ABNORMAL LOW (ref 0.3–1.2)

## 2012-11-02 LAB — CBC WITH DIFFERENTIAL/PLATELET
Basophils Absolute: 0.1 10*3/uL (ref 0.0–0.1)
Basophils Relative: 1 % (ref 0–1)
Eosinophils Absolute: 0.2 10*3/uL (ref 0.0–0.7)
Eosinophils Relative: 3 % (ref 0–5)
Lymphs Abs: 2.4 10*3/uL (ref 0.7–4.0)
MCH: 29.7 pg (ref 26.0–34.0)
MCHC: 35 g/dL (ref 30.0–36.0)
MCV: 84.9 fL (ref 78.0–100.0)
Neutrophils Relative %: 55 % (ref 43–77)
Platelets: 217 10*3/uL (ref 150–400)
RBC: 5.09 MIL/uL (ref 4.22–5.81)
RDW: 12.9 % (ref 11.5–15.5)

## 2012-11-02 LAB — COMPREHENSIVE METABOLIC PANEL
AST: 40 U/L — ABNORMAL HIGH (ref 0–37)
Albumin: 4.3 g/dL (ref 3.5–5.2)
Alkaline Phosphatase: 86 U/L (ref 39–117)
BUN: 16 mg/dL (ref 6–23)
Calcium: 9.2 mg/dL (ref 8.4–10.5)
Chloride: 107 mEq/L (ref 96–112)
Creat: 1.14 mg/dL (ref 0.50–1.35)
Glucose, Bld: 88 mg/dL (ref 70–99)

## 2012-11-02 LAB — TROPONIN I: Troponin I: <0.3 ng/mL (ref <0.30)

## 2012-11-02 LAB — BASIC METABOLIC PANEL
Calcium: 10 mg/dL (ref 8.4–10.5)
GFR calc Af Amer: >90 mL/min (ref >90)
GFR calc non Af Amer: 78 mL/min — ABNORMAL LOW (ref >90)
Glucose, Bld: 98 mg/dL (ref 70–99)
Potassium: 4.4 mEq/L (ref 3.5–5.1)
Sodium: 141 mEq/L (ref 135–145)

## 2012-11-02 LAB — POCT I-STAT TROPONIN I: Troponin i, poc: 0 ng/mL (ref 0.00–0.08)

## 2012-11-02 LAB — LIPID PANEL
HDL: 53 mg/dL (ref >39)
Total CHOL/HDL Ratio: 3.8 Ratio
Triglycerides: 67 mg/dL (ref <150)

## 2012-11-02 MED ORDER — IOHEXOL 350 MG/ML SOLN
100.0000 mL | Freq: Once | INTRAVENOUS | Status: AC | PRN
  Administered 2012-11-02: 100 mL via INTRAVENOUS

## 2012-11-02 MED ORDER — OXYCODONE-ACETAMINOPHEN 5-325 MG PO TABS
2.0000 | ORAL_TABLET | Freq: Once | ORAL | Status: AC
  Administered 2012-11-02: 2 via ORAL
  Filled 2012-11-02: qty 2

## 2012-11-02 MED ORDER — NAPROXEN 500 MG PO TABS: 500.0000 mg | ORAL_TABLET | Freq: Two times a day (BID) | ORAL | Status: DC

## 2012-11-02 NOTE — Progress Notes (Signed)
Subjective:    Patient ID: Jason [name], male    DOB: 1966/01/28, 47 y.o.   MRN: [id number]  HPI Jason [name] is a 47 y.o. male Scheduled for CPE and other concerns today  Hx of recurrent abdominal pain, with prior gastroenterologic workup, then underwent exploratory laparotomy with partial small bowel resection 01/10/12 - surgeon : Dr. Carman Ching. initally followed by Dr. Jarold Motto, then by notes transferred care to Dr. Dulce Sellar. Has had some recurrence of abdominal pain, but not as much pain as prior. Increased gas.  No recent eval or call to Dr. Dulce Sellar.  Vomited 3 times in past week. Not usually vomiting. Was constipated - 1 BM with Mag citrate - last BM yesterday. No fever. Thinks may have had some ischemic injury to bowel, but no known follow up or further testing.  Did not go back to surgeon for 6 month follow up.   Chest pain - started 2 days ago.  Started with pain in L calf - felt like a knot last week, then pain started in L shoulder blade and chest after standing up at part time job all day - 2:30 pm. Hurt to take a breath. Hurt to sneeze or cough, but no recent illness, NKI, no change in activity.  Pain in L chest to L neck and shoulder, L shoulder.  Worried about blood clot. No hx of DVT, but mom had blood clot from heart valve issue that led to a stroke. No recent prolonged car travel or air travel. Unable to lay flat 2 nights ago.  Pain in leg gone now.  Chest pain less yesterday and today, but still there - 3/10 today. 10/10 Saturday - no relief with 2 percocet. Shortness of breath walking up stairs last week at work. Hurts to breath in deep, but not short of breath today. Off BP meds since March 2013.   Saw cardiologist few years ago for shortness of breath. Stress cardiolite 08/2007 - no ST change, or scar or ischemia. Hx of tachycardia. Off meds for almost a year.   Increased stress with new job and custody battle with exwife.  Currently involved with same sex partner for 4 and  1/2 years.  partner dinks at times. No physical abuse, just verbal arguments.   Hard year last year - lost job in February from [employer], started working for [employer] in November.    Review of Systems  Constitutional: Positive for fatigue. Negative for fever and chills.  Respiratory: Positive for chest tightness and shortness of breath (dyspne on exertion past 1 week. ). Negative for cough and wheezing.   Cardiovascular: Positive for chest pain.  Gastrointestinal: Positive for vomiting, abdominal pain, constipation and abdominal distention (felt hard yesterday until max citrate. ).  Skin: Negative for rash.       Objective:   Physical Exam  Vitals reviewed. Constitutional: He is oriented to person, place, and time. He appears well-developed and well-nourished. No distress.  HENT:  Head: Normocephalic and atraumatic.  Eyes: EOM are normal. Pupils are equal, round, and reactive to light.  Neck: Trachea normal. No JVD present. Carotid bruit is not present.  Cardiovascular: Regular rhythm and normal heart sounds.   No extrasystoles are present. Tachycardia present.  PMI is not displaced.  Exam reveals no friction rub.   No murmur heard.      Chest nontender.   Pulmonary/Chest: Effort normal and breath sounds normal. No stridor.  Abdominal: Soft. Normal appearance. He exhibits no distension and no pulsatile  midline mass. There is tenderness (generalized - mild ttp, without guarding.). There is no rigidity, no guarding and no CVA tenderness.  Musculoskeletal: Normal range of motion. He exhibits no edema and no tenderness.       Calves nontender, equal circumference (42cm at 15cm below patella bilterally).  Negative homan's.   Neurological: He is alert and oriented to person, place, and time.  Skin: Skin is warm and dry. He is not diaphoretic.  Psychiatric: He has a normal mood and affect. His speech is normal and behavior is normal.   [facility] reading (PRIMARY) by  Dr. Neva Seat: CXR: NAD EKG: ?st  junctional depression, but appear unchanged from 01/01/11.      Assessment & Plan:  Jason [name] is a 47 y.o. male 1. Chest pain  DG Chest 2 View, EKG 12-Lead  2. SOB (shortness of breath)  DG Chest 2 View, EKG 12-Lead  3. Leg pain    4. HTN (hypertension)  EKG 12-Lead, Comprehensive metabolic panel, Lipid panel  5. Tachycardia  EKG 12-Lead, CBC  6. Abdominal pain  Lipase   Chest pain - L sided, pleuritic, with radiation to L neck, shoulder and back. Initial L calf pain and sensation of a nodule in L calf for 1 week.  Also noted DOE preceding week. No acute findings noted on EKG when compared to prior, and CXR overall reassuring.  Remote surgery, and no apparent recent DVT risk factors, but with history provided and persistent (but lessened) pain - will have evaluated in ER for possible cardiac enzymes and CT chest. Of note - did have reassuring stress testing, but this was in 2008.   Abdominal pain - recurrent with recent flair, and with few episodes of vomiting. S/p partial SBR in April of last year.  Per patient - question of ischemic bowel? Will check lipase, CMP, but can discuss this in ER eval. Abdomen soft and overall reassuring exam in office. Outpatient follow up with Dr. Dulce Sellar may be needed.   HTN - slightly high, but off meds - plan to restart Toprol pending eval in ER.  Fasting labs from this am - CMP and lipid panel ordered.   Tachycardia - plan to restart Toprol, pending ER eval.   Anxiety/social stressors. May be partially contributory to above sx's.  Plan to follow up after ER eval to discuss further, as well as CPE as was initially scheduled for today.   Patient Instructions  Go straight to [facility] emergency room as soon as you leave the office. I will call the triage nurse to advise of your arrival. Stop and call 911 if any worsening or change in your symptoms on the way there.  Your should receive a call or letter about your lab results within the next week to 10 days,  but follow up with me in the next few weeks for a physical and follow up of your symptoms today.

## 2012-11-02 NOTE — ED Notes (Signed)
Pt c/o left sided CP with SOB x 2 days; pt sts left leg pain with knot; pt sent from PCP to r/ DVT or PE

## 2012-11-02 NOTE — Patient Instructions (Addendum)
Go straight to [facility] emergency room as soon as you leave the office. I will call the triage nurse to advise of your arrival. Stop and call 911 if any worsening or change in your symptoms on the way there.  Your should receive a call or letter about your lab results within the next week to 10 days, but follow up with me in the next few weeks for a physical and follow up of your symptoms today.

## 2012-11-02 NOTE — ED Provider Notes (Signed)
History     CSN: [id number]  Arrival date & time 11/02/12  1741   First MD Initiated Contact with Patient 11/02/12 1913      Chief Complaint  Patient presents with  . Chest Pain  . Shortness of Breath    (Consider location/radiation/quality/duration/timing/severity/associated sxs/prior treatment) HPI Comments: Patient presents with left-sided chest pain that has been constant for the past 2 days and is worse with breathing and worse with movement of his left arm. Is associated with mild shortness of breath. Sent from his PCPs office today to rule out PE. Reports he had some left leg pain with a "knot" for several days preceding the chest pain but the leg pain and knot has resolved. He denies any cough or fever. He denies any cardiac or pulmonary history. She's not take any recent car trips or travel. He has no history of blood clots.  The history is provided by the patient.    Past Medical History  Diagnosis Date  . Headache   . Palpitations   . Esophageal reflux   . Irritable bowel syndrome   . Diverticulosis of colon (without mention of hemorrhage)   . Unspecified gastritis and gastroduodenitis without mention of hemorrhage   . Backache, unspecified   . Other and unspecified hyperlipidemia   . Anxiety state, unspecified   . IBS (irritable bowel syndrome)   . Peptic ulcer   . Nausea   . Abdominal distension   . Abdominal pain   . Nasal congestion   . Cough     with bronchitis 10-12-2011  . Hernia   . Blood transfusion march 2012  . Hiatal hernia   . PONV (postoperative nausea and vomiting)     bowel blockage after may 2012 surgery  . Family history of malignant neoplasm of gastrointestinal tract   . Recurrent upper respiratory infection (URI)     bronchitis- 10/2011- treated /w antbx- by PCP  . Pneumonia     [facility]  . Anemia     anemic- 11/2010, post bleeding ulcer - treated ./w   bld. transfusion   . Arthritis     back, neck , shoulder   . Spondylitis,  ankylosing     lower back issues related to AS  . Unspecified essential hypertension     Dr. Jens Som- consulted /w pt.  (2008)due to ^ heartrate & SOB /w climbing stairs, had echo /stress- wnl. Currently PCP manages BP    . Blood transfusion without reported diagnosis     Past Surgical History  Procedure Date  . Umbilical hernia repair 01/2011    1998,  01/2011 & 11/01/2011  . Cervical fusion  2007    c 4   . Stomach surgery march 2012    bleeding ulcer  . Back surgery     X3, lower  back  . Appendectomy 2000  . Laparoscopy 11/01/2011    Procedure: LAPAROSCOPY DIAGNOSTIC;  Surgeon: Shelly Rubenstein, MD;  Location: [facility] ORS;  Service: General;  Laterality: N/A;  . Hernia repair 11/01/2011    incisional hernia  . Laparotomy 01/10/2012    Procedure: EXPLORATORY LAPAROTOMY;  Surgeon: Shelly Rubenstein, MD;  Location: [facility] OR;  Service: General;  Laterality: N/A;  . Bowel resection 01/10/2012    Procedure: SMALL BOWEL RESECTION;  Surgeon: Shelly Rubenstein, MD;  Location: [facility] OR;  Service: General;  Laterality: N/A;  . Small intestine surgery 12/2011    Family History  Problem Relation Age of Onset  . Colon  cancer Maternal Grandmother   . Stomach cancer Maternal Grandmother     METS  . Esophageal cancer Maternal Grandmother   . Uterine cancer Sister   . COPD Sister   . Hypertension Sister   . Heart disease Mother   . Clotting disorder Mother   . Stroke Mother     two strokes  . Diabetes Father   . Heart disease Father   . Pancreatic cancer Maternal Grandfather   . Leukemia Brother   . Anesthesia problems Neg Hx   . Cancer Paternal Grandmother   . Heart disease Paternal Grandfather     heart attack    History  Substance Use Topics  . Smoking status: Never Smoker   . Smokeless tobacco: Never Used  . Alcohol Use: Yes     Comment: rarely      Review of Systems  Constitutional: Negative for activity change and appetite change.  HENT: Negative for congestion and rhinorrhea.    Respiratory: Positive for chest tightness and shortness of breath.   Cardiovascular: Positive for chest pain.  Gastrointestinal: Negative for nausea, vomiting and abdominal pain.  Genitourinary: Negative for dysuria, hematuria and testicular pain.  Musculoskeletal: Negative for back pain.  Skin: Negative for rash.  Neurological: Negative for dizziness, weakness and headaches.  A complete 10 system review of systems was obtained and all systems are negative except as noted in the HPI and PMH.    Allergies  Hydrocodone-acetaminophen  Home Medications   Current Outpatient Rx  Name  Route  Sig  Dispense  Refill  . NAPROXEN SODIUM 220 MG PO TABS   Oral   Take 440 mg by mouth 2 (two) times daily as needed. For pain         . OXYCODONE-ACETAMINOPHEN 5-325 MG PO TABS   Oral   Take 1 tablet by mouth every 4 (four) hours as needed. For pain         . NAPROXEN 500 MG PO TABS   Oral   Take 1 tablet (500 mg total) by mouth 2 (two) times daily.   30 tablet   0     BP 115/74  Pulse 81  Temp 98.3 F (36.8 C) (Oral)  Resp 14  SpO2 96%  Physical Exam  Constitutional: He is oriented to person, place, and time. He appears well-developed and well-nourished. No distress.  HENT:  Head: Normocephalic and atraumatic.  Mouth/Throat: Oropharynx is clear and moist. No oropharyngeal exudate.  Eyes: Conjunctivae normal and EOM are normal. Pupils are equal, round, and reactive to light.  Neck: Normal range of motion. Neck supple.  Cardiovascular: Normal rate, regular rhythm, normal heart sounds and intact distal pulses.   No murmur heard.      +2 femoral and DP pulses  Pulmonary/Chest: Effort normal and breath sounds normal. No respiratory distress. He exhibits tenderness.       L sided chest tenderness to palpation.  Worse with movement of L arm. No rash.  Abdominal: Soft. There is no tenderness. There is no rebound and no guarding.  Musculoskeletal: Normal range of motion. He exhibits  no edema and no tenderness.       No calf swelling or asymmetry  Neurological: He is alert and oriented to person, place, and time. No cranial nerve deficit. He exhibits normal muscle tone. Coordination normal.  Skin: Skin is warm.    ED Course  Procedures (including critical care time)  Labs Reviewed  BASIC METABOLIC PANEL - Abnormal; Notable for the following:  GFR calc non Af Amer 78 (*)     All other components within normal limits  HEPATIC FUNCTION PANEL - Abnormal; Notable for the following:    AST 43 (*)     Total Bilirubin 0.2 (*)     All other components within normal limits  CBC WITH DIFFERENTIAL  D-DIMER, QUANTITATIVE  POCT I-STAT TROPONIN I  LACTIC ACID, PLASMA  LIPASE, BLOOD  TROPONIN I   Dg Chest 2 View  11/02/2012  *RADIOLOGY REPORT*  Clinical Data: Left chest pain.  CHEST - 2 VIEW  Comparison: 01/06/2012  Findings: Chronic linear densities at the left lung base are suggestive for scarring.  No focal airspace disease or pulmonary edema.  Bony thorax is intact.  Stable appearance of the heart and mediastinum.  IMPRESSION: No acute chest findings.   Original Report Authenticated By: Richarda Overlie, M.D.    Ct Angio Chest Pe W/cm &/or Wo Cm  11/02/2012  *RADIOLOGY REPORT*  Clinical Data: Left chest pain and shortness of breath.  CT ANGIOGRAPHY CHEST  Technique:  Multidetector CT imaging of the chest using the standard protocol during bolus administration of intravenous contrast. Multiplanar reconstructed images including MIPs were obtained and reviewed to evaluate the vascular anatomy.  Contrast: OMNIPAQUE IOHEXOL 350 MG/ML SOLN  Comparison: Chest radiograph 11/02/2012  Findings: There is some motion artifact on this examination.  No evidence for a large or central pulmonary embolism.  No significant pericardial or pleural fluid.  There is no evidence for chest lymphadenopathy.  Images of the upper abdomen are unremarkable.  The trachea and mainstem bronchi are patent.  There  is left basilar atelectasis.  There is also volume loss in the lingula.  No acute bony abnormality.  IMPRESSION: No evidence for a large or central pulmonary embolism.  Basilar volume loss without focal consolidation or airspace disease.   Original Report Authenticated By: Richarda Overlie, M.D.      1. Atypical chest pain       MDM  Left-sided chest pain with shortness of breath sent from PCPs office to rule out PE. Chest pain is atypical for ACS reproducible on exam. It is worse with deep breathing and worse with left arm movement.  Patient had negative stress test 2008. The pain is having today he states is different. D-dimer negative. CT angiogram of the chest negative for PE. No lower showing findings to suggest DVT. D-dimer negative.  Patient's pain is atypical for ACS. It is reproducible on exam and worse with arm movement and deep breathing. EKG unchanged, and a troponin negative.  Patient stable for discharge.      Date: 11/02/2012  Rate: 92  Rhythm: normal sinus rhythm  QRS Axis: normal  Intervals: normal  ST/T Wave abnormalities: normal  Conduction Disutrbances:none  Narrative Interpretation:   Old EKG Reviewed: none available.     Glynn Octave, MD 11/03/12 [phone]

## 2012-11-02 NOTE — Progress Notes (Signed)
  Subjective:    Patient ID: Jason [name], male    DOB: Oct 14, 1965, 47 y.o.   MRN: [id number]  HPI    Review of Systems  Constitutional: Negative.   HENT: Positive for neck pain.   Eyes: Negative.   Respiratory: Negative.   Cardiovascular: Positive for leg swelling.  Gastrointestinal: Positive for abdominal pain and abdominal distention.  Genitourinary: Negative.   Skin: Negative.   Neurological: Negative.   Hematological: Negative.   Psychiatric/Behavioral: Positive for sleep disturbance. The patient is nervous/anxious.        Objective:   Physical Exam        Assessment & Plan:

## 2012-11-04 ENCOUNTER — Telehealth: Payer: Self-pay

## 2012-11-04 DIAGNOSIS — I10 Essential (primary) hypertension: Secondary | ICD-10-CM

## 2012-11-04 DIAGNOSIS — F419 Anxiety disorder, unspecified: Secondary | ICD-10-CM

## 2012-11-04 NOTE — Telephone Encounter (Signed)
Appt made, forwarding RX request to clinical

## 2012-11-04 NOTE — Telephone Encounter (Signed)
I was in there yesterday for a physical with Dr Neva Seat and instead he needed to do a medical visit and sent me to the ER. I was not able to get my prescriptions refills. So can you check with him and see if he could call me in metropolor blood pressure medicine and my klonopin my stress anxiety medicine. Can you also schedule my physical visit with Dr Neva Seat per him as soon as possible and the lastest afternoon time possible. Thanks, please call or email if you have any questions [phone]. Jason [name] Date of birth: 10/06/1965   Appts will call him to reschedule.

## 2012-11-04 NOTE — Telephone Encounter (Signed)
Can I send in? Please advise

## 2012-11-05 MED ORDER — CLONAZEPAM 1 MG PO TABS: 1.0000 mg | ORAL_TABLET | Freq: Two times a day (BID) | ORAL | Status: DC | PRN

## 2012-11-05 MED ORDER — METOPROLOL SUCCINATE ER 25 MG PO TB24: 25.0000 mg | ORAL_TABLET | Freq: Every day | ORAL | Status: DC

## 2012-11-05 NOTE — Telephone Encounter (Signed)
Spoke w/pt who verified that he does take the metoprolol succ ER 25 mg QD, and the klonopin 1 mg. Pt reports that he is very stressed right now and he does need to take the klonopin BID, so he would need #60 for 30 days. He will also need 2 mos of each med to get him through until his appt on 12/21/12. Dr Neva Seat, do you want to RF these for two mos and the greater # of klonopin?

## 2012-11-05 NOTE — Telephone Encounter (Signed)
Yes - we can call in a month of both of those meds - i do not see them in the meds section on [facility], so may need to pull paper chart and call patient to verify doses. Thanks. Should be able to schedule visit in next month.  Let me know if longer than that.

## 2012-11-05 NOTE — Telephone Encounter (Signed)
I will prescribe these at the requested level, and can advise ready for pick up later this evening once I sign them, but if his anxiety is worse recently - may need to come in to talk about this prior to march visit - can schedule appointment for this or see me at 102 when I am scheduled.

## 2012-11-05 NOTE — Telephone Encounter (Signed)
Pt's appt w/Dr Neva Seat is scheduled for 12/21/12, so he will need 2 mos of RFs. Called [facility] [address] and they stated that pt has never had the klonopin filled there and that other than Naproxen pt hasn't had anything filled there in over a year. The last record in paper chart is from 02/16/08 for klonopin 1 mg BID #30, but on problem list sheet it is listed as klonopin 1 mg TID w/note that pt is only using 2 x week. Metoprolol succinate ER is for 25 MG QD.  LMOM for pt to CB. Verify strengths/dosage of both meds and correct pharmacy.

## 2012-11-05 NOTE — Telephone Encounter (Signed)
Faxed in klonopin Rx to [facility]. Notified pt RFs were sent and advised him that Dr Neva Seat will be happy to see him sooner at 102 if he would like to come in to discuss anxiety. Pt thanked [place].

## 2012-12-21 ENCOUNTER — Ambulatory Visit (INDEPENDENT_AMBULATORY_CARE_PROVIDER_SITE_OTHER): Payer: BC Managed Care – PPO | Admitting: Family Medicine

## 2012-12-21 ENCOUNTER — Encounter: Payer: Self-pay | Admitting: Family Medicine

## 2012-12-21 VITALS — BP 134/89 | HR 102 | Temp 97.8°F | Resp 18 | Ht 71.0 in | Wt 211.0 lb

## 2012-12-21 DIAGNOSIS — R000 Tachycardia, unspecified: Secondary | ICD-10-CM

## 2012-12-21 DIAGNOSIS — I10 Essential (primary) hypertension: Secondary | ICD-10-CM

## 2012-12-21 DIAGNOSIS — F419 Anxiety disorder, unspecified: Secondary | ICD-10-CM

## 2012-12-21 DIAGNOSIS — R5381 Other malaise: Secondary | ICD-10-CM

## 2012-12-21 DIAGNOSIS — F411 Generalized anxiety disorder: Secondary | ICD-10-CM

## 2012-12-21 DIAGNOSIS — Z87898 Personal history of other specified conditions: Secondary | ICD-10-CM

## 2012-12-21 DIAGNOSIS — Z23 Encounter for immunization: Secondary | ICD-10-CM

## 2012-12-21 DIAGNOSIS — R5383 Other fatigue: Secondary | ICD-10-CM

## 2012-12-21 DIAGNOSIS — Z0000 Encounter for general adult medical examination without abnormal findings: Secondary | ICD-10-CM

## 2012-12-21 MED ORDER — CLONAZEPAM 1 MG PO TABS: 1.0000 mg | ORAL_TABLET | Freq: Two times a day (BID) | ORAL | Status: DC | PRN

## 2012-12-21 NOTE — Progress Notes (Signed)
  Subjective:    Patient ID: Jason [name], male    DOB: 1965/11/17, 47 y.o.   MRN: [id number]  HPI    Review of Systems  Eyes: Positive for itching.  Psychiatric/Behavioral: Positive for sleep disturbance and decreased concentration. The patient is nervous/anxious.        Objective:   Physical Exam        Assessment & Plan:

## 2012-12-21 NOTE — Patient Instructions (Addendum)
Recheck in 1 month.   Call counselor to setup appointment. Another option is [facility]: [phone] Klonopin up to twice per day for now. We will refer you for psychiatry and cardiologist appontments. Avoid exertional exercise until evaluated by cardiologist. Continue same blood pressure medicine for now - Return to the clinic or go to the nearest emergency room if any of your symptoms worsen or new symptoms occur. Watch diet, and plan on recheck cholesterol (fasting) at next office visit.  Return to the clinic or go to the nearest emergency room if any of your symptoms worsen or new symptoms occur.  Keeping you healthy  Get these tests  Blood pressure- Have your blood pressure checked once a year by your healthcare provider.  Normal blood pressure is 120/80.  Weight- Have your body mass index (BMI) calculated to screen for obesity.  BMI is a measure of body fat based on height and weight. You can also calculate your own BMI at https://www.west-esparza.com/.  Cholesterol- Have your cholesterol checked regularly starting at age 69, sooner may be necessary if you have diabetes, high blood pressure, if a family member developed heart diseases at an early age or if you smoke.   Chlamydia, HIV, and other sexual transmitted disease- Get screened each year until the age of 62 then within three months of each new sexual partner. - we can discuss these tests at the next office visit as discussed.   Diabetes- Have your blood sugar checked regularly if you have high blood pressure, high cholesterol, a family history of diabetes or if you are overweight.  Get these vaccines  Flu shot- Every fall.  Tetanus shot- Every 10 years.  Menactra- Single dose; prevents meningitis.  Take these steps  Don't smoke- If you do smoke, ask your healthcare provider about quitting. For tips on how to quit, go to www.smokefree.gov or call 1-800-QUIT-NOW.  Be physically active- Exercise 5 days a week for at least 30  minutes.  If you are not already physically active start slow and gradually work up to 30 minutes of moderate physical activity.  Examples of moderate activity include walking briskly, mowing the yard, dancing, swimming bicycling, etc.  Eat a healthy diet- Eat a variety of healthy foods such as fruits, vegetables, low fat milk, low fat cheese, yogurt, lean meats, poultry, fish, beans, tofu, etc.  For more information on healthy eating, go to www.thenutritionsource.org  Drink alcohol in moderation- Limit alcohol intake two drinks or less a day.  Never drink and drive.  Dentist- Brush and floss teeth twice daily; visit your dentis twice a year.  Depression-Your emotional health is as important as your physical health.  If you're feeling down, losing interest in things you normally enjoy please talk with your healthcare provider.  Gun Safety- If you keep a gun in your home, keep it unloaded and with the safety lock on.  Bullets should be stored separately.  Helmet use- Always wear a helmet when riding a motorcycle, bicycle, rollerblading or skateboarding.  Safe sex- If you may be exposed to a sexually transmitted infection, use a condom  Seat belts- Seat bels can save your life; always wear one.  Smoke/Carbon Monoxide detectors- These detectors need to be installed on the appropriate level of your home.  Replace batteries at least once a year.  Skin Cancer- When out in the sun, cover up and use sunscreen SPF 15 or higher.  Violence- If anyone is threatening or hurting you, please tell your healthcare provider.
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
## 2012-12-24 ENCOUNTER — Ambulatory Visit (INDEPENDENT_AMBULATORY_CARE_PROVIDER_SITE_OTHER): Payer: BC Managed Care – PPO | Admitting: Cardiology

## 2012-12-24 ENCOUNTER — Encounter: Payer: Self-pay | Admitting: Cardiology

## 2012-12-24 VITALS — BP 124/82 | HR 98 | Ht 71.0 in | Wt 213.0 lb

## 2012-12-24 DIAGNOSIS — R079 Chest pain, unspecified: Secondary | ICD-10-CM

## 2012-12-24 DIAGNOSIS — R002 Palpitations: Secondary | ICD-10-CM

## 2012-12-24 DIAGNOSIS — I10 Essential (primary) hypertension: Secondary | ICD-10-CM

## 2012-12-24 DIAGNOSIS — E785 Hyperlipidemia, unspecified: Secondary | ICD-10-CM

## 2012-12-24 NOTE — Patient Instructions (Addendum)
Your physician recommends that you schedule a follow-up appointment in: AS NEEDED  Your physician has requested that you have an exercise tolerance test. For further information please visit www.cardiosmart.org. Please also follow instruction sheet, as given.    Exercise Stress Electrocardiography An exercise stress test is a heart test (EKG) which is done while you are moving. You will walk on a treadmill. This test will tell your doctor how your heart does when it is forced to work harder and how much activity you can safely handle. BEFORE THE TEST  Wear shorts or athletic pants.  Wear comfortable tennis shoes.  Women need to wear a bra that allows patches to be put on under it. TEST  An EKG cable will be attached to your waist. This cable is hooked up to patches, which look like round stickers stuck to your chest.  You will be asked to walk on the treadmill.  You will walk until you are too tired or until you are told to stop.  Tell the doctor right away if you have:  Chest pain.  Leg cramps.  Shortness of breath.  Dizziness.  The test may last 30 minutes to 1 hour. The timing depends on your physical condition and the condition of your heart. AFTER THE TEST  You will rest for about 6 minutes. During this time, your heart rhythm and blood pressure will be checked.  The testing equipment will be removed from your body and you can get dressed.  You may go home or back to your hospital room. You may keep doing all your usual activities as told by your doctor. Finding out the results of your test Ask when your test results will be ready. Make sure you get your test results. Document Released: 03/04/2008 Document Revised: 12/09/2011 Document Reviewed: 03/04/2008 Exit[employer] Patient Information 2013 [employer].   

## 2012-12-24 NOTE — Assessment & Plan Note (Signed)
Management per primary care. 

## 2012-12-24 NOTE — Assessment & Plan Note (Signed)
Blood pressure is controlled. Continue present medications. 

## 2012-12-24 NOTE — Assessment & Plan Note (Signed)
Patient describes increased heart rate to 190 with exercise associated with dizziness. Plan exercise treadmill to further assess.

## 2012-12-24 NOTE — Progress Notes (Signed)
WUJ:[id number] male that I have seen in the past for atypical chest pain, dyspnea and palpitations. Previous Myoview in November of 2008 showed no sign of scar or ischemia and ejection fraction was 67%. Echocardiogram in November 2008 showed normal LV function and trace aortic/mitral regurgitation. Previous heart or showed PACs. I last saw him in 2008. Seen in emergency room on 11/02/2012 with atypical chest pain. Enzymes and d-dimer negative. CTA in February of 2014 showed no pulmonary embolus. Patient's chest pain at the time was continuous for 3 days. It increases with certain movements and inspiration. No associated symptoms. Since then he notes dyspnea with exercise. He also states his heart rate increases to 190 with exercise and he feels lightheaded. No exertional chest pain.  Current Outpatient Prescriptions  Medication Sig Dispense Refill  . clonazePAM (KLONOPIN) 1 MG tablet Take 1 tablet (1 mg total) by mouth 2 (two) times daily as needed for anxiety.  60 tablet  0  . metoprolol succinate (TOPROL-XL) 25 MG 24 hr tablet Take 1 tablet (25 mg total) by mouth daily.  90 tablet  1   No current facility-administered medications for this visit.     Past Medical History  Diagnosis Date  . Esophageal reflux   . Irritable bowel syndrome   . Diverticulosis of colon (without mention of hemorrhage)   . Unspecified gastritis and gastroduodenitis without mention of hemorrhage   . Other and unspecified hyperlipidemia   . Anxiety state, unspecified   . IBS (irritable bowel syndrome)   . Peptic ulcer   . Hernia   . Blood transfusion march 2012  . Hiatal hernia   . PONV (postoperative nausea and vomiting)     bowel blockage after may 2012 surgery  . Family history of malignant neoplasm of gastrointestinal tract   . Recurrent upper respiratory infection (URI)     bronchitis- 10/2011- treated /w antbx- by PCP  . [facility]     [facility]  . Anemia     anemic- 11/2010, post bleeding ulcer -  treated ./w   bld. transfusion   . Arthritis     back, neck , shoulder   . Spondylitis, ankylosing     lower back issues related to AS  . Unspecified essential hypertension     Dr. Jens Som- consulted /w pt.  (2008)due to ^ heartrate & SOB /w climbing stairs, had echo /stress- wnl. Currently PCP manages BP    . Blood transfusion without reported diagnosis   . IBS (irritable bowel syndrome)   . Diverticulitis     Past Surgical History  Procedure Laterality Date  . Umbilical hernia repair  01/2011    1998,  01/2011 & 11/01/2011  . Cervical fusion   2007    c 4   . Stomach surgery  march 2012    bleeding ulcer  . Back surgery      X3, lower  back  . Appendectomy  2000  . Laparoscopy  11/01/2011    Procedure: LAPAROSCOPY DIAGNOSTIC;  Surgeon: Shelly Rubenstein, MD;  Location: [facility];  Service: General;  Laterality: N/A;  . Hernia repair  11/01/2011    incisional hernia  . Laparotomy  01/10/2012    Procedure: EXPLORATORY LAPAROTOMY;  Surgeon: Shelly Rubenstein, MD;  Location: [facility] OR;  Service: General;  Laterality: N/A;  . Bowel resection  01/10/2012    Procedure: SMALL BOWEL RESECTION;  Surgeon: Shelly Rubenstein, MD;  Location: [facility] OR;  Service: General;  Laterality: N/A;  . Dolphus Jenny  intestine surgery  12/2011    History   Social History  . Marital Status: Divorced    Spouse Name: N/A    Number of Children: 2  . Years of Education: N/A   Occupational History  . quality analyst   .  [employer]   Social History Main Topics  . Smoking status: Never Smoker   . Smokeless tobacco: Never Used  . Alcohol Use: Yes     Comment: rarely  . Drug Use: No  . Sexually Active: Yes     Comment: number of sex partners in the last 12 months 1   Other Topics Concern  . Not on file   Social History Narrative   Exercise walking and running 2 x/weekfor 45 minutes to 1 hour    ROS: no fevers or chills, productive cough, hemoptysis, dysphasia, odynophagia, melena, hematochezia, dysuria, hematuria,  rash, seizure activity, orthopnea, PND, pedal edema, claudication. Remaining systems are negative.  Physical Exam: Well-developed well-nourished in no acute distress.  Skin is warm and dry.  HEENT is normal.  Neck is supple.  Chest is clear to auscultation with normal expansion.  Cardiovascular exam is regular rate and rhythm.  Abdominal exam nontender or distended. No masses palpated. Extremities show no edema. neuro grossly intact  ECG 11/02/2012-sinus rhythm with no ST changes.

## 2012-12-24 NOTE — Assessment & Plan Note (Signed)
Likely musculoskeletal. Workup negative.

## 2012-12-31 ENCOUNTER — Telehealth: Payer: Self-pay

## 2012-12-31 NOTE — Telephone Encounter (Signed)
Jason [name] [phone] or Jason [name] [phone] left message for patient to call me back.

## 2012-12-31 NOTE — Telephone Encounter (Signed)
The following email was submitted to your website from Tarius Stangelo Good morning, I was in there march 24 to see dr Neva Seat and he had set up some appts to see my heart dr and wanted me to see a psychiatrist and by the time I left the front ladies were gone and he had said that i need to see him by the end of April because i only have one refill left for my blood pressure medicine which will carry me thru April so I guess i need to schedule an appt with him and I also need to see if he could ref me to a male psychiatrist that take bcbsnc. Thanks you please email me or call me at [phone] Date of birth: 01-31-1966

## 2012-12-31 NOTE — Telephone Encounter (Signed)
Called patient and left voice mail.Referrals left an instructional message on 12/22/2012. The patient will need to call to make his own appointment due to the nature of the referral. If he would like to use his previous doctor that would be acceptable.

## 2012-12-31 NOTE — Telephone Encounter (Signed)
Patient wants to know if you can recommend a psychiatrist for him, please see me or Gaye about this, Amy

## 2013-01-01 ENCOUNTER — Ambulatory Visit (INDEPENDENT_AMBULATORY_CARE_PROVIDER_SITE_OTHER): Payer: BC Managed Care – PPO | Admitting: Physician Assistant

## 2013-01-01 DIAGNOSIS — R079 Chest pain, unspecified: Secondary | ICD-10-CM

## 2013-01-01 NOTE — Progress Notes (Signed)
Exercise Treadmill Test  Pre-Exercise Testing Evaluation Rhythm: normal sinus  Rate: 83                 Test  Exercise Tolerance Test Ordering MD: Olga Millers, MD  Interpreting MD: Tereso Newcomer, PA-C  Unique Test No: 1  Treadmill:  1  Indication for ETT: chest pain - rule out ischemia  Contraindication to ETT: No   Stress Modality: exercise - treadmill  Cardiac Imaging Performed: non   Protocol: standard Bruce - maximal  Max BP:  179/59  Max MPHR (bpm):  174 85% MPR (bpm):  148  MPHR obtained (bpm):  169 % MPHR obtained:  97%  Reached 85% MPHR (min:sec):  5:41 Total Exercise Time (min-sec):  9:00  Workload in METS:  10.3 Borg Scale: 19  Reason ETT Terminated:  patient's desire to stop    ST Segment Analysis At Rest: normal ST segments - no evidence of significant ST depression With Exercise: non-specific ST changes  Other Information Arrhythmia:  No Angina during ETT:  absent (0) Quality of ETT:  diagnostic  ETT Interpretation:  normal - no evidence of ischemia by ST analysis  Comments: Good exercise tolerance. No chest pain. Normal BP response to exercise. No significant ST-T changes to suggest ischemia.  No exercise induced arrhythmias.  Recommendations: F/u with Dr. Olga Millers as directed. Luna Glasgow, PA-C  9:58 AM 01/01/2013

## 2013-01-01 NOTE — Telephone Encounter (Signed)
We had talked about a psychologist - I take that is what he was asking about. Stuart Hunt's number given at last ov, but other names also options, or Karmen Bongo: [phone] If he would like a psychiatrist instead, let me know ,but we had discussed a psychologist last ov.

## 2013-01-04 NOTE — Telephone Encounter (Signed)
I have scheduled his next appt with Dr. Neva Seat for 4/28 (first avail) at 3pm. Could you let him know when returns your call.

## 2013-01-05 NOTE — Telephone Encounter (Signed)
Still unable to reach patient.  Letter sent.

## 2013-01-21 ENCOUNTER — Encounter: Payer: Self-pay | Admitting: Family Medicine

## 2013-01-25 ENCOUNTER — Ambulatory Visit (INDEPENDENT_AMBULATORY_CARE_PROVIDER_SITE_OTHER): Payer: BC Managed Care – PPO | Admitting: Family Medicine

## 2013-01-25 ENCOUNTER — Encounter: Payer: Self-pay | Admitting: Family Medicine

## 2013-01-25 VITALS — BP 124/82 | HR 91 | Temp 98.1°F | Resp 16 | Ht 70.5 in | Wt 216.0 lb

## 2013-01-25 DIAGNOSIS — F419 Anxiety disorder, unspecified: Secondary | ICD-10-CM

## 2013-01-25 DIAGNOSIS — R109 Unspecified abdominal pain: Secondary | ICD-10-CM

## 2013-01-25 DIAGNOSIS — F411 Generalized anxiety disorder: Secondary | ICD-10-CM

## 2013-01-25 DIAGNOSIS — I10 Essential (primary) hypertension: Secondary | ICD-10-CM

## 2013-01-25 MED ORDER — FLUOXETINE HCL 10 MG PO TABS: 10.0000 mg | ORAL_TABLET | Freq: Every day | ORAL | Status: DC

## 2013-01-25 MED ORDER — METOPROLOL SUCCINATE ER 25 MG PO TB24: 25.0000 mg | ORAL_TABLET | Freq: Every day | ORAL | Status: DC

## 2013-01-25 MED ORDER — CLONAZEPAM 1 MG PO TABS: 1.0000 mg | ORAL_TABLET | Freq: Two times a day (BID) | ORAL | Status: DC | PRN

## 2013-01-25 NOTE — Patient Instructions (Signed)
Call your gastroenterologist for an appointment to discuss your abdominal pains.  Return to the clinic or go to the nearest emergency room if any of your symptoms worsen or new symptoms occur including fever.  Follow up with your cardiologist for your lightheaded episode.  Return to the clinic or go to the nearest emergency room if these symptoms recur - especially as starting the prozac and the precautions with this medicine.  recheck in next 1 month - can plan on blood and other tests at that visit as we discussed today.
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
## 2013-01-28 NOTE — Progress Notes (Signed)
Pt had already scheduled his f-up for 6/2 at 4:15. Provider requested 30 minute OV so appt changed to 4:00 and pt notified via vmail.

## 2013-02-01 ENCOUNTER — Ambulatory Visit: Payer: BC Managed Care – PPO | Admitting: Family Medicine

## 2013-03-01 ENCOUNTER — Encounter: Payer: Self-pay | Admitting: Family Medicine

## 2013-03-01 ENCOUNTER — Ambulatory Visit (INDEPENDENT_AMBULATORY_CARE_PROVIDER_SITE_OTHER): Payer: BC Managed Care – PPO | Admitting: Family Medicine

## 2013-03-01 VITALS — BP 110/80 | HR 61 | Temp 98.0°F | Resp 16 | Ht 71.5 in | Wt 208.0 lb

## 2013-03-01 DIAGNOSIS — F411 Generalized anxiety disorder: Secondary | ICD-10-CM

## 2013-03-01 DIAGNOSIS — R7989 Other specified abnormal findings of blood chemistry: Secondary | ICD-10-CM

## 2013-03-01 DIAGNOSIS — F419 Anxiety disorder, unspecified: Secondary | ICD-10-CM

## 2013-03-01 DIAGNOSIS — E785 Hyperlipidemia, unspecified: Secondary | ICD-10-CM

## 2013-03-01 MED ORDER — FLUOXETINE HCL 20 MG PO TABS: 20.0000 mg | ORAL_TABLET | Freq: Every day | ORAL | Status: DC

## 2013-03-01 MED ORDER — CLONAZEPAM 1 MG PO TABS: 1.0000 mg | ORAL_TABLET | Freq: Two times a day (BID) | ORAL | Status: DC | PRN

## 2013-03-01 NOTE — Patient Instructions (Signed)
You should receive a call or letter about your lab results within the next week to 10 days.  Increase the prozac to 20mg , keep follow up with therapist.  Keep a record of your blood pressures outside of the office and if running low, or lightheadness or dizziness, may need to decrease your blood pressure medicine since taking prozac now.

## 2013-03-01 NOTE — Progress Notes (Signed)
Subjective:    Patient ID: DEMANI MCBRIEN, male    DOB: 1966/04/14, 47 y.o.   MRN: [id number]  HPI NORBERT MALKIN is a 47 y.o. male See prior ov's. Here for follow up of anxiety.   Anxiety - multiple situational/social stressors, with prior anxiety treated by psychiatry and prior counseling. Last ov - planned on new psycologist/therapist Will Lovey Newcomer - appt this Friday.   Last ov 01/25/13 - Started low dose Prozac at 10mg  as risk of hypotension and bradycardia with betablocker, and warning signs discussed.  Feeling a little more anxious - found out at work - jobs to be cut in the next year.  Looking for new job. Taking Klonopin once per day on average. Occasionally 2nd dose. No alcohol in past 2 weeks. Exercising every day. No SI. No new side effects. No changes at home, but no safety concerns.  Fasting today - for recheck cholesterol. Borderline LDL and elevated LFT  prior. LDL 134, AST 40   Review of Systems  Constitutional: Negative for fatigue and unexpected weight change.  Eyes: Negative for visual disturbance.  Respiratory: Negative for cough, chest tightness and shortness of breath.   Cardiovascular: Negative for chest pain, palpitations and leg swelling.  Gastrointestinal: Negative for abdominal pain and blood in stool.  Neurological: Negative for dizziness, light-headedness and headaches.  Psychiatric/Behavioral: Negative for suicidal ideas. The patient is nervous/anxious.       Objective:   Physical Exam  Vitals reviewed. Constitutional: He is oriented to person, place, and time. He appears well-developed and well-nourished.  HENT:  Head: Normocephalic and atraumatic.  Eyes: EOM are normal. Pupils are equal, round, and reactive to light.  Neck: No JVD present. Carotid bruit is not present.  Cardiovascular: Normal rate, regular rhythm and normal heart sounds.   No murmur heard. Pulmonary/Chest: Effort normal and breath sounds normal. He has no rales.  Musculoskeletal: He  exhibits no edema.  Neurological: He is alert and oriented to person, place, and time.  Skin: Skin is warm and dry.  Psychiatric: He has a normal mood and affect. His behavior is normal. Judgment and thought content normal.       Assessment & Plan:  HAAKON TITSWORTH is a 47 y.o. male Anxiety /GAD (generalized anxiety disorder) - Plan: FLUoxetine (PROZAC) 20 MG tablet, clonazePAM (KLONOPIN) 1 MG tablet - increased prozac to 20mg . Orthostatic precautions. Continue Klonopin if needed, and has appt with therapist this week.  Continue exercise.   Other and unspecified hyperlipidemia - Plan: Lipid panel - borderline high LDL, but has started exercise and diet changes.  Elevated LFTs - Plan: Comprehensive metabolic panel. Single borderline liver test prior.   Recheck for physical in next 3 months - have had to defer physical due to acute concerns since earlier this year.   Meds ordered this encounter  Medications  . FLUoxetine (PROZAC) 20 MG tablet    Sig: Take 1 tablet (20 mg total) by mouth daily.    Dispense:  90 tablet    Refill:  1  . DISCONTD: clonazePAM (KLONOPIN) 1 MG tablet    Sig: Take 1 tablet (1 mg total) by mouth 2 (two) times daily as needed for anxiety.    Dispense:  60 tablet    Refill:  1    Ok to fill 02/01/13.  . clonazePAM (KLONOPIN) 1 MG tablet    Sig: Take 1 tablet (1 mg total) by mouth 2 (two) times daily as needed for anxiety.    Dispense:  60 tablet    Refill:  1   Patient Instructions  You should receive a call or letter about your lab results within the next week to 10 days.  Increase the prozac to 20mg , keep follow up with therapist.  Keep a record of your blood pressures outside of the office and if running low, or lightheadness or dizziness, may need to decrease your blood pressure medicine since taking prozac now.

## 2013-03-02 LAB — COMPREHENSIVE METABOLIC PANEL
ALT: 15 U/L (ref 0–53)
CO2: 23 mEq/L (ref 19–32)
Calcium: 9.6 mg/dL (ref 8.4–10.5)
Chloride: 102 mEq/L (ref 96–112)
Creat: 1.08 mg/dL (ref 0.50–1.35)
Total Protein: 7.2 g/dL (ref 6.0–8.3)

## 2013-03-02 LAB — LIPID PANEL
Cholesterol: 176 mg/dL (ref 0–200)
Total CHOL/HDL Ratio: 4.4 Ratio

## 2013-03-02 NOTE — Progress Notes (Signed)
Pt had a CPE in March of 2014, sent msg to provider to confirm appt type/date to be scheduled.

## 2013-03-03 NOTE — Progress Notes (Signed)
Per provider, set up 3 month f-up OV.

## 2013-03-09 NOTE — Progress Notes (Signed)
Left msg for pt to schedule 3 month f-up with Dr. Neva Seat.

## 2013-03-16 NOTE — Progress Notes (Signed)
Sent pt reminder letter to schedule 3 month f-up with Dr. Neva Seat.

## 2013-06-11 ENCOUNTER — Encounter: Payer: Self-pay | Admitting: *Deleted

## 2013-06-11 DIAGNOSIS — K449 Diaphragmatic hernia without obstruction or gangrene: Secondary | ICD-10-CM

## 2013-06-21 ENCOUNTER — Ambulatory Visit (INDEPENDENT_AMBULATORY_CARE_PROVIDER_SITE_OTHER): Payer: BC Managed Care – PPO | Admitting: Family Medicine

## 2013-06-21 ENCOUNTER — Encounter: Payer: Self-pay | Admitting: Family Medicine

## 2013-06-21 VITALS — BP 120/86 | HR 72 | Temp 98.1°F | Resp 16 | Ht 70.0 in | Wt 207.8 lb

## 2013-06-21 DIAGNOSIS — R51 Headache: Secondary | ICD-10-CM

## 2013-06-21 DIAGNOSIS — Z0000 Encounter for general adult medical examination without abnormal findings: Secondary | ICD-10-CM

## 2013-06-21 DIAGNOSIS — F419 Anxiety disorder, unspecified: Secondary | ICD-10-CM

## 2013-06-21 DIAGNOSIS — F411 Generalized anxiety disorder: Secondary | ICD-10-CM

## 2013-06-21 DIAGNOSIS — Z7251 High risk heterosexual behavior: Secondary | ICD-10-CM

## 2013-06-21 DIAGNOSIS — I10 Essential (primary) hypertension: Secondary | ICD-10-CM

## 2013-06-21 DIAGNOSIS — Z23 Encounter for immunization: Secondary | ICD-10-CM

## 2013-06-21 LAB — POCT URINALYSIS DIPSTICK
Glucose, UA: NEGATIVE
Ketones, UA: NEGATIVE
Spec Grav, UA: <=1.005

## 2013-06-21 MED ORDER — FLUOXETINE HCL 20 MG PO TABS: 20.0000 mg | ORAL_TABLET | Freq: Every day | ORAL | Status: DC

## 2013-06-21 MED ORDER — METOPROLOL SUCCINATE ER 25 MG PO TB24: 25.0000 mg | ORAL_TABLET | Freq: Every day | ORAL | Status: DC

## 2013-06-21 MED ORDER — FLUOXETINE HCL 10 MG PO CAPS: 10.0000 mg | ORAL_CAPSULE | Freq: Every day | ORAL | Status: DC

## 2013-06-21 MED ORDER — CLONAZEPAM 1 MG PO TABS: 1.0000 mg | ORAL_TABLET | Freq: Two times a day (BID) | ORAL | Status: DC | PRN

## 2013-06-21 NOTE — Patient Instructions (Signed)
Increase to 30mg  prozac each day (one of 20mg  and one of 10mg ). Recheck in next month with a headache diary, and to discuss this dose of prozac. Keep follow up with therapist.  Return to the clinic or go to the nearest emergency room if any of your symptoms worsen or new symptoms occur.   You should receive a call or letter about your lab results within the next week to 10 days.   Keeping you healthy  Get these tests  Blood pressure- Have your blood pressure checked once a year by your healthcare provider.  Normal blood pressure is 120/80.  Weight- Have your body mass index (BMI) calculated to screen for obesity.  BMI is a measure of body fat based on height and weight. You can also calculate your own BMI at https://www.west-esparza.com/.  Cholesterol- Have your cholesterol checked regularly starting at age 50, sooner may be necessary if you have diabetes, high blood pressure, if a family member developed heart diseases at an early age or if you smoke.   Chlamydia, HIV, and other sexual transmitted disease- Get screened each year until the age of 43 then within three months of each new sexual partner.  Diabetes- Have your blood sugar checked regularly if you have high blood pressure, high cholesterol, a family history of diabetes or if you are overweight.  Get these vaccines  Flu shot- Every fall.  Tetanus shot- Every 10 years.  Menactra- Single dose; prevents meningitis.  Take these steps  Don't smoke- If you do smoke, ask your healthcare provider about quitting. For tips on how to quit, go to www.smokefree.gov or call 1-800-QUIT-NOW.  Be physically active- Exercise 5 days a week for at least 30 minutes.  If you are not already physically active start slow and gradually work up to 30 minutes of moderate physical activity.  Examples of moderate activity include walking briskly, mowing the yard, dancing, swimming bicycling, etc.  Eat a healthy diet- Eat a variety of healthy foods such as  fruits, vegetables, low fat milk, low fat cheese, yogurt, lean meats, poultry, fish, beans, tofu, etc.  For more information on healthy eating, go to www.thenutritionsource.org  Drink alcohol in moderation- Limit alcohol intake two drinks or less a day.  Never drink and drive.  Dentist- Brush and floss teeth twice daily; visit your dentis twice a year.  Depression-Your emotional health is as important as your physical health.  If you're feeling down, losing interest in things you normally enjoy please talk with your healthcare provider.  Gun Safety- If you keep a gun in your home, keep it unloaded and with the safety lock on.  Bullets should be stored separately.  Helmet use- Always wear a helmet when riding a motorcycle, bicycle, rollerblading or skateboarding.  Safe sex- If you may be exposed to a sexually transmitted infection, use a condom  Seat belts- Seat bels can save your life; always wear one.  Smoke/Carbon Monoxide detectors- These detectors need to be installed on the appropriate level of your home.  Replace batteries at least once a year.  Skin Cancer- When out in the sun, cover up and use sunscreen SPF 15 or higher.  Violence- If anyone is threatening or hurting you, please tell your healthcare provider.

## 2013-06-21 NOTE — Progress Notes (Signed)
  Subjective:    Patient ID: Jason [name], male    DOB: June 11, 1966, 47 y.o.   MRN: [id number]  HPI    Review of Systems  Constitutional: Negative.   Eyes: Negative.   Respiratory: Negative.   Cardiovascular: Negative.   Gastrointestinal: Negative.   Endocrine: Negative.   Genitourinary: Negative.   Musculoskeletal: Negative.   Skin: Negative.   Allergic/Immunologic: Negative.   Neurological: Positive for headaches.  Hematological: Negative.   Psychiatric/Behavioral: The patient is nervous/anxious.        Objective:   Physical Exam        Assessment & Plan:

## 2013-06-21 NOTE — Progress Notes (Signed)
Subjective:    Patient ID: Jason [name], male    DOB: [birth year], 47 y.o.   MRN: [id number]  HPI Jason [name] is a 47 y.o. male  Here for annual exam.  Also needs refills of meds. No other specific concerns.  Annual exam: Tdap: 09/2010. Dentist: last appt 8 months ago.  Optho: vision works in January. Flu vaccine given today.  No prostate cancer in family, but does have FH of esophagus, stomach, throat cancer and leukemia.   Anxiety - multiple situational/social stressors, with prior anxiety treated by psychiatry and prior counseling. new psycologist/therapist Will Lovey Newcomer. 01/25/13 - Started low dose Prozac at 10mg , increased to 20mg  last ov.  No orthostatic or low BP symptoms. Feeling a little more anxious at 03/01/13 found out at work - jobs to be cut in the next year. Still unknown.  Health issues with mom. Still meeting with counselor, but out for 4 weeks.  Will be setting up appt. In next few weeks.  Feels like this helped, but would like to try higher dose of prozac. Still looking for new job. Taking Klonopin once per day on average. Occasionally 2nd dose - 3 times per week. Exercising every day. No SI. 1-2 glasses of wine per week.  No changes at home, but no safety concerns.   Fasting labs at 03/01/13 ov: TC: 176, trig 73, HDL 40, LDL 121 (down from 134).   HTN - FH of early heart disease.  Cards: [facility].   ETT 01/01/13: ETT Interpretation: normal - no evidence of ischemia by ST analysis  Comments:  Good exercise tolerance. No chest pain.  Normal BP response to exercise.  No significant ST-T changes to suggest ischemia.  No exercise induced arrhythmias.   No new sexual partners, but unsure if current partner has been with others.    Review of Systems 13 point review of systems per patient health survey noted.  Negative other than as indicated on reviewed nursing note.   headaches about twice per week - improves some with klonopin. Has had wavy lines in vision as aura. Hx of  similar migraines in past. Episodic migraine med in past.      Objective:   Physical Exam  Vitals reviewed. Constitutional: He is oriented to person, place, and time. He appears well-developed and well-nourished.  HENT:  Head: Normocephalic and atraumatic.  Right Ear: External ear normal.  Left Ear: External ear normal.  Mouth/Throat: Oropharynx is clear and moist.  Eyes: Conjunctivae and EOM are normal. Pupils are equal, round, and reactive to light.  Neck: Normal range of motion. Neck supple. No thyromegaly present.  Cardiovascular: Normal rate, regular rhythm, normal heart sounds and intact distal pulses.   Pulmonary/Chest: Effort normal and breath sounds normal. No respiratory distress. He has no wheezes.  Abdominal: Soft. He exhibits no distension. There is no tenderness. Hernia confirmed negative in the right inguinal area and confirmed negative in the left inguinal area.  Musculoskeletal: Normal range of motion. He exhibits no edema and no tenderness.  Lymphadenopathy:    He has no cervical adenopathy.  Neurological: He is alert and oriented to person, place, and time. He has normal reflexes.  Skin: Skin is warm and dry.  Psychiatric: He has a normal mood and affect. His behavior is normal.      Results for orders placed in visit on 06/21/13  POCT URINALYSIS DIPSTICK      Result Value Range   Color, UA yellow     Clarity, UA  clear     Glucose, UA neg     Bilirubin, UA neg     Ketones, UA neg     Spec Grav, UA <=1.005     Blood, UA neg     pH, UA 5.5     Protein, UA neg     Urobilinogen, UA 0.2     Nitrite, UA neg     Leukocytes, UA Negative         Assessment & Plan:  [name] is a 47 y.o. male Routine general medical examination at a health care facility - Plan: POCT urinalysis dipstick, Comprehensive metabolic panel, CBC with Differential, Lipid panel, TSH, HSV(herpes simplex vrs) 1+2 ab-IgG, HIV antibody, RPR, Hepatitis B surface antibody, Hepatitis B  surface antigen, Hepatitis C antibody, GC/Chlamydia Probe Amp, CANCELED: GC/Chlamydia Amp Probe, Genital Need for prophylactic vaccination and inoculation against influenza - Plan: Flu Vaccine QUAD 36+ mos IM -labs as above, flu vaccine given. Anticipatory guidance as below.   HTN (hypertension) - Plan: metoprolol succinate (TOPROL-XL) 25 MG 24 hr tablet.  Controlled. Refilled toprol xl at 25mg  qd. Hx of stress testing earlier this year ok. Has cardiologist - Dr. Jens Som if needed.   Headache(784.0) - migraine by hx, suspect some tension.stress component. HA diary for next ov, may need abortive tx for migraine as in past and can look at prior chart to determine prior agent.   Problems related to high-risk sexual behavior - monogamous with same sex partner but possible concern of partner's activity.  STI testing above.   Anxiety state,GAD.feels lie needs higher dose of prozac.  Will  increase to just 30mg  initially to decrease risk of hypotension with BB. rtc precautions.  Still with relationship stressor, but finances limiting changes at present.  Denies safety concerns. Will plan on following up with his therapist.  - Plan: clonazePAM (KLONOPIN) 1 MG tablet, FLUoxetine (PROZAC) 20 MG tablet refilled.   Meds ordered this encounter  Medications  . FLUoxetine (PROZAC) 10 MG capsule    Sig: Take 1 capsule (10 mg total) by mouth daily.    Dispense:  30 capsule    Refill:  3  . clonazePAM (KLONOPIN) 1 MG tablet    Sig: Take 1 tablet (1 mg total) by mouth 2 (two) times daily as needed for anxiety.    Dispense:  60 tablet    Refill:  1  . metoprolol succinate (TOPROL-XL) 25 MG 24 hr tablet    Sig: Take 1 tablet (25 mg total) by mouth daily.    Dispense:  90 tablet    Refill:  1  . FLUoxetine (PROZAC) 20 MG tablet    Sig: Take 1 tablet (20 mg total) by mouth daily.    Dispense:  90 tablet    Refill:  1   Patient Instructions  Increase to 30mg  prozac each day (one of 20mg  and one of 10mg ).  Recheck in next month with a headache diary, and to discuss this dose of prozac. Keep follow up with therapist.  Return to the clinic or go to the nearest emergency room if any of your symptoms worsen or new symptoms occur.   You should receive a call or letter about your lab results within the next week to 10 days.   Keeping you healthy  Get these tests  Blood pressure- Have your blood pressure checked once a year by your healthcare provider.  Normal blood pressure is 120/80.  Weight- Have your body mass index (BMI) calculated to  screen for obesity.  BMI is a measure of body fat based on height and weight. You can also calculate your own BMI at https://www.west-esparza.com/.  Cholesterol- Have your cholesterol checked regularly starting at age 42, sooner may be necessary if you have diabetes, high blood pressure, if a family member developed heart diseases at an early age or if you smoke.   Chlamydia, HIV, and other sexual transmitted disease- Get screened each year until the age of 75 then within three months of each new sexual partner.  Diabetes- Have your blood sugar checked regularly if you have high blood pressure, high cholesterol, a family history of diabetes or if you are overweight.  Get these vaccines  Flu shot- Every fall.  Tetanus shot- Every 10 years.  Menactra- Single dose; prevents meningitis.  Take these steps  Don't smoke- If you do smoke, ask your healthcare provider about quitting. For tips on how to quit, go to www.smokefree.gov or call 1-800-QUIT-NOW.  Be physically active- Exercise 5 days a week for at least 30 minutes.  If you are not already physically active start slow and gradually work up to 30 minutes of moderate physical activity.  Examples of moderate activity include walking briskly, mowing the yard, dancing, swimming bicycling, etc.  Eat a healthy diet- Eat a variety of healthy foods such as fruits, vegetables, low fat milk, low fat cheese, yogurt, lean  meats, poultry, fish, beans, tofu, etc.  For more information on healthy eating, go to www.thenutritionsource.org  Drink alcohol in moderation- Limit alcohol intake two drinks or less a day.  Never drink and drive.  Dentist- Brush and floss teeth twice daily; visit your dentis twice a year.  Depression-Your emotional health is as important as your physical health.  If you're feeling down, losing interest in things you normally enjoy please talk with your healthcare provider.  Gun Safety- If you keep a gun in your home, keep it unloaded and with the safety lock on.  Bullets should be stored separately.  Helmet use- Always wear a helmet when riding a motorcycle, bicycle, rollerblading or skateboarding.  Safe sex- If you may be exposed to a sexually transmitted infection, use a condom  Seat belts- Seat bels can save your life; always wear one.  Smoke/Carbon Monoxide detectors- These detectors need to be installed on the appropriate level of your home.  Replace batteries at least once a year.  Skin Cancer- When out in the sun, cover up and use sunscreen SPF 15 or higher.  Violence- If anyone is threatening or hurting you, please tell your healthcare provider.

## 2013-06-22 LAB — COMPREHENSIVE METABOLIC PANEL
Albumin: 4.9 g/dL (ref 3.5–5.2)
CO2: 28 mEq/L (ref 19–32)
Chloride: 102 mEq/L (ref 96–112)
Glucose, Bld: 88 mg/dL (ref 70–99)
Potassium: 3.8 mEq/L (ref 3.5–5.3)
Sodium: 137 mEq/L (ref 135–145)
Total Protein: 8.1 g/dL (ref 6.0–8.3)

## 2013-06-22 LAB — CBC WITH DIFFERENTIAL/PLATELET
Basophils Absolute: 0.1 10*3/uL (ref 0.0–0.1)
Eosinophils Absolute: 0.3 10*3/uL (ref 0.0–0.7)
Lymphocytes Relative: 33 % (ref 12–46)
Lymphs Abs: 2.5 10*3/uL (ref 0.7–4.0)
Neutrophils Relative %: 53 % (ref 43–77)
Platelets: 203 10*3/uL (ref 150–400)
RBC: 5.18 MIL/uL (ref 4.22–5.81)
WBC: 7.6 10*3/uL (ref 4.0–10.5)

## 2013-06-22 LAB — LIPID PANEL
Cholesterol: 204 mg/dL — ABNORMAL HIGH (ref 0–200)
HDL: 53 mg/dL (ref >39)
Total CHOL/HDL Ratio: 3.8 Ratio

## 2013-06-22 LAB — HIV ANTIBODY (ROUTINE TESTING W REFLEX): HIV: NONREACTIVE

## 2013-06-22 LAB — HEPATITIS B SURFACE ANTIBODY, QUANTITATIVE: Hepatitis B-Post: 0 m[IU]/mL

## 2013-06-22 LAB — RPR

## 2013-06-22 LAB — GC/CHLAMYDIA PROBE AMP: CT Probe RNA: NEGATIVE

## 2013-06-23 LAB — HSV(HERPES SIMPLEX VRS) I + II AB-IGG: HSV 1 Glycoprotein G Ab, IgG: 1.83 IV — ABNORMAL HIGH

## 2013-08-11 ENCOUNTER — Encounter: Payer: Self-pay | Admitting: Family Medicine

## 2013-09-06 ENCOUNTER — Encounter: Payer: Self-pay | Admitting: Family Medicine

## 2013-09-13 ENCOUNTER — Other Ambulatory Visit: Payer: Self-pay | Admitting: Family Medicine

## 2013-09-13 DIAGNOSIS — F411 Generalized anxiety disorder: Secondary | ICD-10-CM

## 2013-09-13 NOTE — Telephone Encounter (Signed)
Dr Neva Seat, see also Pt email encounter from 09/06/13. I'm not sure if you've seen that message from pt.

## 2013-09-13 NOTE — Telephone Encounter (Signed)
Message noted. I refilled med and ready for pickup.  Tried to call Jason [name] on home phone, left message to call back - let him know I am sorry he is going through this, and will be happy to refill this medicine, but if this is not helping, or he needs to chat with me further and I can call him back.

## 2013-09-14 NOTE — Telephone Encounter (Signed)
Pt stated that the klonopin is helping some but not very much, he is not able to sleep. Pt would appreciate a call from Dr Neva Seat if he is able to call.

## 2013-09-14 NOTE — Telephone Encounter (Signed)
error 

## 2013-09-16 NOTE — Telephone Encounter (Signed)
Called pt - still trouble sleeping. Mom passed 1 week ago, nerves are shot. Called counselor - changing offices.  Unable to see him d/t move at this time. Plans on seeing him next week. Taking 1mg  klonopin- 3 per day. Still feels like nerves are shot. Has info on Hospice counseling - January the 3rd. Mind racing with stress of mom's estate and [employer]. Trying to stay busy, but nighttime is tougher, will try Klonopin 1 and 1/2 or up to 2 at bedtime and call with update in next 2 days. If no relief could consider ambien as he has taken this before.

## 2013-11-12 ENCOUNTER — Other Ambulatory Visit: Payer: Self-pay | Admitting: Family Medicine

## 2013-11-16 ENCOUNTER — Encounter: Payer: Self-pay | Admitting: Family Medicine

## 2013-11-16 ENCOUNTER — Other Ambulatory Visit: Payer: Self-pay | Admitting: Family Medicine

## 2013-11-16 NOTE — Telephone Encounter (Signed)
See also pt email message

## 2013-11-18 NOTE — Telephone Encounter (Signed)
Noted.  Refilled - can be called in if able. Also, he should follow up with me in next 6 weeks to discuss Prozac dosing and follow up from last ov. Thanks. -JG.

## 2013-11-19 NOTE — Telephone Encounter (Signed)
Called in. Pt was notified and advised of f/up on email.

## 2013-12-24 ENCOUNTER — Other Ambulatory Visit: Payer: Self-pay

## 2013-12-24 MED ORDER — FLUOXETINE HCL 10 MG PO CAPS: 10.0000 mg | ORAL_CAPSULE | Freq: Every day | ORAL | Status: DC

## 2014-01-24 ENCOUNTER — Ambulatory Visit (INDEPENDENT_AMBULATORY_CARE_PROVIDER_SITE_OTHER): Payer: BC Managed Care – PPO | Admitting: Family Medicine

## 2014-01-24 ENCOUNTER — Encounter: Payer: Self-pay | Admitting: Family Medicine

## 2014-01-24 VITALS — BP 120/80 | HR 81 | Temp 98.5°F | Resp 16 | Ht 71.5 in | Wt 216.2 lb

## 2014-01-24 DIAGNOSIS — F411 Generalized anxiety disorder: Secondary | ICD-10-CM

## 2014-01-24 DIAGNOSIS — E785 Hyperlipidemia, unspecified: Secondary | ICD-10-CM

## 2014-01-24 DIAGNOSIS — I10 Essential (primary) hypertension: Secondary | ICD-10-CM

## 2014-01-24 DIAGNOSIS — F419 Anxiety disorder, unspecified: Secondary | ICD-10-CM

## 2014-01-24 DIAGNOSIS — R000 Tachycardia, unspecified: Secondary | ICD-10-CM

## 2014-01-24 MED ORDER — METOPROLOL SUCCINATE ER 25 MG PO TB24: 25.0000 mg | ORAL_TABLET | Freq: Every day | ORAL | Status: DC

## 2014-01-24 MED ORDER — CLONAZEPAM 1 MG PO TABS: ORAL_TABLET | ORAL | Status: DC

## 2014-01-24 MED ORDER — FLUOXETINE HCL 40 MG PO CAPS: 40.0000 mg | ORAL_CAPSULE | Freq: Every day | ORAL | Status: DC

## 2014-01-24 NOTE — Progress Notes (Signed)
Subjective:  This chart was scribed for Jason Ray, MD by Jason [name], Scribe.  This patient was seen in [place] [facility] and the patient's care was started at 2:46 PM.   Patient ID: Jason [name], male    DOB: [birth year], 48 y.o.   MRN: [id number]  HPI  Jason [name] is a 48 y.o. male PCP: [name], MD   Pt presents for medication refills.  1. Anxiety: See prior office visit in September 2014.  Generalized anxiety with multiple social stressors, treated by psychiatry and counseling.  Increased to 30mg  Prozac at that visit and Klonopin 1mg  tablet bid prn.  Unfortunately since that time mom was terminally ill under the care of hospice.  See the email from October 03, 2023.  She passed away October 05, 2023.  He has a therapist he has worked with in the past.  He was seeing him every 2 weeks but had to spend $3000 on a dental bridge so since then he has been seeing therapist 1x/month.  He thinks his counseling is going well with this therapist.  He is also seeing a hospice counselor.  He reports things are not going very well but he denies thoughts of hurting or harming himself.  He has been using meditation and exercising (walking 4-5x/week for 20 minutes to an hour).  He occasionally does resistance exercise.  Pt is taking the Klonopin at least 2x/day.  He is still taking his Prozac 30mg .  He reports occasional nausea after taking the Prozac which resolves in about 2 hours.  He denies any other side effects.  He tried Prozac 40mg  and states he would like to go up to that permanently.  He has cut back on his alcohol intake to drinking every other weekend.  He is still living with the same partner at home who he has frequent verbal altercations with.  He has asked his partner to get out but "he won't get out, he's on the lease."  However he states he does feel safe at home and denies physical altercations.  He is taking a vacation to [facility] in June.  2. Hypertension: Takes metoprolol 25mg   1x/day.  Stress test done last year by Dr. Stanford Breed and was okay.  He has been checking his BP at home and it has typically been around 130/90.  Last lipid panel and CMP was June 21, 2013.  Total cholesterol elevated 204, HDL 53, LDL 121.  Creatinine was normal at 1.10.  TSH also normal at that time.  Pt notes that occasionally when he does exercise or even light activity (such as cleaning at home) he experiences palpitations, with his heart beating fast and his hands shaking.  This prevents him somewhat from doing strenous exercise.  He denies associated chest pain with these episodes.  He denies any palpitations at rest.     Patient Active Problem List   Diagnosis Date Noted  . Chest pain 12/24/2012  . Duodenal ulcer, unspecified as acute or chronic, without hemorrhage, perforation, or obstruction 01/14/2011  . CONSTIPATION 07/22/2008  . RECTAL BLEEDING 07/22/2008  . ABDOMINAL PAIN, UNSPECIFIED SITE 07/22/2008  . HYPERLIPIDEMIA 11/20/2007  . ANXIETY 11/20/2007  . HYPERTENSION 11/20/2007  . GERD 11/20/2007  . IRRITABLE BOWEL SYNDROME 11/20/2007  . BACK PAIN, CHRONIC 11/20/2007  . HEADACHE, CHRONIC 11/20/2007  . PALPITATIONS 11/20/2007  . DIVERTICULOSIS, COLON 07/06/2007  . GASTRITIS 01/22/2001  . HIATAL HERNIA 01/22/2001    Past Medical History  Diagnosis Date  . Esophageal reflux   .  Irritable bowel syndrome   . Diverticulosis of colon (without mention of hemorrhage)   . Unspecified gastritis and gastroduodenitis without mention of hemorrhage   . Other and unspecified hyperlipidemia   . Anxiety state, unspecified   . IBS (irritable bowel syndrome)   . Peptic ulcer   . Hernia   . Blood transfusion march 2012  . Hiatal hernia   . PONV (postoperative nausea and vomiting)     bowel blockage after may 2012 surgery  . Family history of malignant neoplasm of gastrointestinal tract   . Recurrent upper respiratory infection (URI)     bronchitis- 10/2011- treated /w antbx- by PCP    . Pneumonia     [facility]  . Anemia     anemic- 11/2010, post bleeding ulcer - treated ./w   bld. transfusion   . Arthritis     back, neck , shoulder   . Spondylitis, ankylosing     lower back issues related to AS  . Unspecified essential hypertension     Dr. Stanford Breed- consulted /w pt.  (2008)due to ^ heartrate & SOB /w climbing stairs, had echo /stress- wnl. Currently PCP manages BP    . Blood transfusion without reported diagnosis   . IBS (irritable bowel syndrome)   . Diverticulitis   . Allergy     Past Surgical History  Procedure Laterality Date  . Umbilical hernia repair  01/2011    1998,  01/2011 & 11/01/2011  . Cervical fusion   2007    c 4   . Stomach surgery  march 2012    bleeding ulcer  . Back surgery      X3, lower  back  . Appendectomy  2000  . Laparoscopy  11/01/2011    Procedure: LAPAROSCOPY DIAGNOSTIC;  Surgeon: Harl Bowie, MD;  Location: [facility];  Service: General;  Laterality: N/A;  . Laparotomy  01/10/2012    Procedure: EXPLORATORY LAPAROTOMY;  Surgeon: Harl Bowie, MD;  Location: [facility];  Service: General;  Laterality: N/A;  . Bowel resection  01/10/2012    Procedure: SMALL BOWEL RESECTION;  Surgeon: Harl Bowie, MD;  Location: [facility];  Service: General;  Laterality: N/A;  . Small intestine surgery  12/2011  . Hernia repair  11/01/2011    incisional hernia  . Colon surgery    . Spine surgery      Allergies  Allergen Reactions  . Hydrocodone-Acetaminophen Palpitations    Increased heart rate    Prior to Admission medications   Medication Sig Start Date End Date Taking? Authorizing Provider  clonazePAM (KLONOPIN) 1 MG tablet TAKE 1 TABLET TWICE A DAY AS NEEDED ANXIETY   Yes [name], MD  FLUoxetine (PROZAC) 10 MG capsule Take 1 capsule (10 mg total) by mouth daily. PATIENT NEEDS OFFICE VISIT FOR ADDITIONAL REFILLS 12/24/13  Yes [name], MD  FLUoxetine (PROZAC) 20 MG tablet Take 1 tablet (20 mg total) by mouth daily. 06/21/13   Yes [name], MD  metoprolol succinate (TOPROL-XL) 25 MG 24 hr tablet Take 1 tablet (25 mg total) by mouth daily. 06/21/13  Yes [name], MD    History   Social History  . Marital Status: Divorced    Spouse Name: N/A    Number of Children: 2  . Years of Education: N/A   Occupational History  . quality analyst   .  [employer]   Social History Main Topics  . Smoking status: Never Smoker   .  Smokeless tobacco: Never Used  . Alcohol Use: Yes     Comment: rarely, 1 or 2 a week  . Drug Use: No  . Sexual Activity: Yes     Comment: number of sex partners in the last 78 months 1   Other Topics Concern  . Not on file   Social History Narrative   Exercise walking and running 2 x/weekfor 45 minutes to 1 hour     Review of Systems  Constitutional: Negative for fatigue and unexpected weight change.  Eyes: Negative for visual disturbance.  Respiratory: Negative for cough, chest tightness and shortness of breath.   Cardiovascular: Positive for palpitations. Negative for chest pain and leg swelling.  Gastrointestinal: Positive for nausea. Negative for vomiting, abdominal pain and blood in stool.  Neurological: Negative for dizziness, light-headedness and headaches.  Psychiatric/Behavioral: Positive for dysphoric mood. Negative for suicidal ideas and self-injury. The patient is nervous/anxious.         Objective:   Physical Exam  Vitals reviewed. Constitutional: He is oriented to person, place, and time. He appears well-developed and well-nourished.  HENT:  Head: Normocephalic and atraumatic.  Eyes: EOM are normal. Pupils are equal, round, and reactive to light.  Neck: No JVD present. Carotid bruit is not present.  Cardiovascular: Normal rate, regular rhythm and normal heart sounds.   No murmur heard. Pulmonary/Chest: Effort normal and breath sounds normal. He has no rales.  Musculoskeletal: He exhibits no edema.  Neurological: He is alert and oriented to person,  place, and time.  Skin: Skin is warm and dry.  Psychiatric: He has a normal mood and affect. His speech is normal and behavior is normal. Judgment normal. Cognition and memory are normal. He expresses no suicidal ideation.     Filed Vitals:   01/24/14 1401  BP: 120/80  Pulse: 81  Temp: 98.5 F (36.9 C)  TempSrc: Oral  Resp: 16  Height: 5' 11.5" (1.816 m)  Weight: 216 lb 3.2 oz (98.068 kg)  SpO2: 96%         Assessment & Plan:   Jason [name] is a 48 y.o. male Anxiety , GAD (generalized anxiety disorder) - Plan: clonazePAM (KLONOPIN) 1 MG tablet, FLUoxetine (PROZAC) 40 MG capsule  -increase to 40mg  Prozac as felt this was better dose and with recent increase stressor with death of his parent few months ago.  Continue counseling with Hospice and other therapist, Klonopin BID prn until sx's stabilize. Refilled meds.   HTN (hypertension) - Plan: metoprolol succinate (TOPROL-XL) 25 MG 24 hr tablet, COMPLETE METABOLIC PANEL WITH GFR, Lipid panel, TSH  - stable. No med changes.   Other and unspecified hyperlipidemia - Plan: COMPLETE METABOLIC PANEL WITH GFR, Lipid panel  - labs pending.  Racing heart beat - Plan: TSH  -prior tsh nl, recheck today and advised to discuss with cardiologist whether he needs to be seen prior to trying to increase exercise. Did have stress testing last year.  Rtc/er precautions given.    Meds ordered this encounter  Medications  . clonazePAM (KLONOPIN) 1 MG tablet    Sig: TAKE 1 TABLET TWICE A DAY AS NEEDED ANXIETY    Dispense:  60 tablet    Refill:  2  . metoprolol succinate (TOPROL-XL) 25 MG 24 hr tablet    Sig: Take 1 tablet (25 mg total) by mouth daily.    Dispense:  90 tablet    Refill:  1  . FLUoxetine (PROZAC) 40 MG capsule    Sig: Take  1 capsule (40 mg total) by mouth daily.    Dispense:  90 capsule    Refill:  3   Patient Instructions  Call your cardiologist to determine if you need to be seen for the heart rate elevating with  exercise. Walking for exercise for now until they either see you or clear you to advance the intensity of exercise.   Increased prozac to 40mg  each day.  If any new side effects - let me know.  Keep an eye on your blood pressure and make sure it is not dropping low on this change. Keep follow up with counselors and let me know if any change in your symptoms.   recheck in 6 months for physical.  You should receive a call or letter about your lab results within the next week to 10 days.   Return to the clinic or go to the nearest emergency room if any of your symptoms worsen or new symptoms occur.   I personally performed the services described in this documentation, which was scribed in my presence. The recorded information has been reviewed and considered, and addended by me as needed.

## 2014-01-24 NOTE — Patient Instructions (Addendum)
Call your cardiologist to determine if you need to be seen for the heart rate elevating with exercise. Walking for exercise for now until they either see you or clear you to advance the intensity of exercise.   Increased prozac to 40mg  each day.  If any new side effects - let me know.  Keep an eye on your blood pressure and make sure it is not dropping low on this change. Keep follow up with counselors and let me know if any change in your symptoms.   recheck in 6 months for physical.  You should receive a call or letter about your lab results within the next week to 10 days.   Return to the clinic or go to the nearest emergency room if any of your symptoms worsen or new symptoms occur.

## 2014-01-25 LAB — COMPLETE METABOLIC PANEL WITH GFR
ALK PHOS: 80 U/L (ref 39–117)
ALT: 16 U/L (ref 0–53)
AST: 25 U/L (ref 0–37)
Albumin: 4.5 g/dL (ref 3.5–5.2)
BUN: 12 mg/dL (ref 6–23)
CO2: 23 meq/L (ref 19–32)
Calcium: 9.7 mg/dL (ref 8.4–10.5)
Chloride: 105 mEq/L (ref 96–112)
Creat: 1.1 mg/dL (ref 0.50–1.35)
GFR, Est Non African American: 80 mL/min
Glucose, Bld: 77 mg/dL (ref 70–99)
Potassium: 4.2 mEq/L (ref 3.5–5.3)
SODIUM: 141 meq/L (ref 135–145)
TOTAL PROTEIN: 7.1 g/dL (ref 6.0–8.3)
Total Bilirubin: 0.4 mg/dL (ref 0.2–1.2)

## 2014-01-25 LAB — LIPID PANEL
CHOL/HDL RATIO: 4.2 ratio
Cholesterol: 205 mg/dL — ABNORMAL HIGH (ref 0–200)
HDL: 49 mg/dL (ref >39)
LDL CALC: 134 mg/dL — AB (ref 0–99)
Triglycerides: 112 mg/dL (ref <150)
VLDL: 22 mg/dL (ref 0–40)

## 2014-01-25 LAB — TSH: TSH: 1.482 u[IU]/mL (ref 0.350–4.500)

## 2014-02-06 ENCOUNTER — Encounter: Payer: Self-pay | Admitting: *Deleted

## 2014-04-05 ENCOUNTER — Encounter: Payer: Self-pay | Admitting: Family Medicine

## 2014-04-07 NOTE — Telephone Encounter (Signed)
Called to clarify refill - left message for him to call back for more info. He was given #60 and 2 refills on 01/24/14 - should have been enough for 3 months.  Has he been using them more than 2 times per day?  If so (noted recent stressors) can send in #30, no refill,  but should follow up with me before these run out to make sure this is right dose and regimen. Thanks.

## 2014-04-08 ENCOUNTER — Other Ambulatory Visit: Payer: Self-pay | Admitting: Gastroenterology

## 2014-04-08 DIAGNOSIS — R1084 Generalized abdominal pain: Secondary | ICD-10-CM

## 2014-04-11 ENCOUNTER — Ambulatory Visit
Admission: RE | Admit: 2014-04-11 | Discharge: 2014-04-11 | Disposition: A | Discharge status: Home / Self Care | Payer: BC Managed Care – PPO | Source: Ambulatory Visit | Attending: Gastroenterology | Admitting: Gastroenterology

## 2014-04-11 DIAGNOSIS — R1084 Generalized abdominal pain: Secondary | ICD-10-CM

## 2014-04-11 MED ORDER — IOHEXOL 300 MG/ML  SOLN
125.0000 mL | Freq: Once | INTRAMUSCULAR | Status: AC | PRN
Start: 2014-04-11 — End: 2014-04-11
  Administered 2014-04-11: 125 mL via INTRAVENOUS

## 2014-04-18 NOTE — Telephone Encounter (Signed)
Have not heard back - see prior message.  I can send in refill if needed, but see prior note. Please call him and check status.

## 2014-04-19 ENCOUNTER — Other Ambulatory Visit: Payer: Self-pay | Admitting: Gastroenterology

## 2014-04-26 ENCOUNTER — Encounter: Payer: Self-pay | Admitting: Family Medicine

## 2014-05-10 ENCOUNTER — Other Ambulatory Visit: Payer: Self-pay | Admitting: Family Medicine

## 2014-05-10 ENCOUNTER — Encounter: Payer: Self-pay | Admitting: Family Medicine

## 2014-05-10 DIAGNOSIS — F411 Generalized anxiety disorder: Secondary | ICD-10-CM

## 2014-05-10 DIAGNOSIS — F419 Anxiety disorder, unspecified: Secondary | ICD-10-CM

## 2014-05-10 MED ORDER — CLONAZEPAM 1 MG PO TABS
ORAL_TABLET | ORAL | Status: DC
Start: 2014-05-10 — End: 2014-06-09

## 2014-05-10 NOTE — Telephone Encounter (Signed)
Rx called in and pt notified on [place].

## 2014-05-16 ENCOUNTER — Telehealth (INDEPENDENT_AMBULATORY_CARE_PROVIDER_SITE_OTHER): Payer: Self-pay

## 2014-05-16 NOTE — Telephone Encounter (Signed)
LMOM for pt giving him an appt with Dr Ninfa Linden for 8/25 arrive at 2:30/2:50.

## 2014-05-24 ENCOUNTER — Encounter (INDEPENDENT_AMBULATORY_CARE_PROVIDER_SITE_OTHER): Payer: Self-pay | Admitting: Surgery

## 2014-05-24 ENCOUNTER — Ambulatory Visit (INDEPENDENT_AMBULATORY_CARE_PROVIDER_SITE_OTHER): Payer: BC Managed Care – PPO | Admitting: Surgery

## 2014-05-24 VITALS — BP 130/78 | HR 81 | Temp 97.8°F | Ht 71.0 in | Wt 215.0 lb

## 2014-05-24 DIAGNOSIS — R109 Unspecified abdominal pain: Secondary | ICD-10-CM

## 2014-05-24 NOTE — Progress Notes (Signed)
Subjective:     Patient ID: Jason [name], male   DOB: 1966/07/19, 48 y.o.   MRN: [id number]  HPI This is a very pleasant gentleman operated on 2 years ago for an area of edematous, swollen, mildly fibrotic the small intestines of uncertain etiology. He did have a significant abdominal discomfort prompting diagnostic laparoscopy that showed this area. I actually operated on him previously for an incisional hernia. He was doing well until June of this year we started developing cramping abdominal pain and significant bloating. He will also have occasional bloody bowel movements. He is currently undergoing further workup at [facility] after being worked up by the gastroenterologist here in [place]     Objective:   Physical Exam On exam, his abdomen is mildly distended but soft minimally tender    Assessment:     Abdominal pain of uncertain etiology     Plan:     He will be undergoing an MR enterography at [facility] as well as further medical workup. He still may need some sort of surgical intervention. I will await their workup and then we will determine where to go from there. He will call you back when the workup is complete.

## 2014-06-07 ENCOUNTER — Other Ambulatory Visit: Payer: Self-pay | Admitting: Family Medicine

## 2014-06-07 DIAGNOSIS — F419 Anxiety disorder, unspecified: Secondary | ICD-10-CM

## 2014-06-07 DIAGNOSIS — F411 Generalized anxiety disorder: Secondary | ICD-10-CM

## 2014-06-09 ENCOUNTER — Other Ambulatory Visit: Payer: Self-pay | Admitting: Family Medicine

## 2014-06-13 ENCOUNTER — Other Ambulatory Visit: Payer: Self-pay | Admitting: Family Medicine

## 2014-06-13 ENCOUNTER — Encounter: Payer: Self-pay | Admitting: Family Medicine

## 2014-06-16 ENCOUNTER — Encounter: Payer: Self-pay | Admitting: Gastroenterology

## 2014-07-04 ENCOUNTER — Encounter: Payer: Self-pay | Admitting: Family Medicine

## 2014-07-11 DIAGNOSIS — K638219 Small intestinal bacterial overgrowth, unspecified: Secondary | ICD-10-CM | POA: Insufficient documentation

## 2014-07-11 DIAGNOSIS — K6389 Other specified diseases of intestine: Secondary | ICD-10-CM | POA: Insufficient documentation

## 2014-07-18 ENCOUNTER — Other Ambulatory Visit: Payer: Self-pay | Admitting: Family Medicine

## 2014-07-19 ENCOUNTER — Other Ambulatory Visit: Payer: Self-pay | Admitting: Family Medicine

## 2014-07-20 MED ORDER — CLONAZEPAM 1 MG PO TABS: ORAL_TABLET | ORAL | Status: DC

## 2014-07-20 NOTE — Telephone Encounter (Signed)
Refilled, can stop by tomorrow to sign.

## 2014-07-21 NOTE — Telephone Encounter (Signed)
Called in and notified pt on MyChart.

## 2014-08-01 ENCOUNTER — Encounter: Payer: Self-pay | Admitting: Family Medicine

## 2014-08-01 ENCOUNTER — Ambulatory Visit (INDEPENDENT_AMBULATORY_CARE_PROVIDER_SITE_OTHER): Payer: BC Managed Care – PPO | Admitting: Family Medicine

## 2014-08-01 VITALS — BP 140/94 | HR 90 | Temp 98.1°F | Resp 16 | Ht 70.0 in | Wt 218.0 lb

## 2014-08-01 DIAGNOSIS — Z0000 Encounter for general adult medical examination without abnormal findings: Secondary | ICD-10-CM

## 2014-08-01 DIAGNOSIS — F419 Anxiety disorder, unspecified: Secondary | ICD-10-CM

## 2014-08-01 DIAGNOSIS — K900 Celiac disease: Secondary | ICD-10-CM

## 2014-08-01 DIAGNOSIS — R7989 Other specified abnormal findings of blood chemistry: Secondary | ICD-10-CM

## 2014-08-01 DIAGNOSIS — R5383 Other fatigue: Secondary | ICD-10-CM

## 2014-08-01 DIAGNOSIS — F4323 Adjustment disorder with mixed anxiety and depressed mood: Secondary | ICD-10-CM

## 2014-08-01 DIAGNOSIS — K6389 Other specified diseases of intestine: Secondary | ICD-10-CM

## 2014-08-01 DIAGNOSIS — E291 Testicular hypofunction: Secondary | ICD-10-CM

## 2014-08-01 DIAGNOSIS — I10 Essential (primary) hypertension: Secondary | ICD-10-CM

## 2014-08-01 DIAGNOSIS — R1084 Generalized abdominal pain: Secondary | ICD-10-CM

## 2014-08-01 DIAGNOSIS — Z23 Encounter for immunization: Secondary | ICD-10-CM

## 2014-08-01 DIAGNOSIS — F411 Generalized anxiety disorder: Secondary | ICD-10-CM

## 2014-08-01 DIAGNOSIS — Z125 Encounter for screening for malignant neoplasm of prostate: Secondary | ICD-10-CM

## 2014-08-01 LAB — CBC WITH DIFFERENTIAL/PLATELET
Basophils Absolute: 0.1 10*3/uL (ref 0.0–0.1)
Basophils Relative: 1 % (ref 0–1)
EOS PCT: 1 % (ref 0–5)
Eosinophils Absolute: 0.1 10*3/uL (ref 0.0–0.7)
HEMATOCRIT: 41.5 % (ref 39.0–52.0)
HEMOGLOBIN: 14.5 g/dL (ref 13.0–17.0)
Lymphocytes Relative: 33 % (ref 12–46)
Lymphs Abs: 2.3 10*3/uL (ref 0.7–4.0)
MCH: 28.8 pg (ref 26.0–34.0)
MCHC: 34.9 g/dL (ref 30.0–36.0)
MCV: 82.3 fL (ref 78.0–100.0)
MONO ABS: 0.8 10*3/uL (ref 0.1–1.0)
Monocytes Relative: 11 % (ref 3–12)
Neutro Abs: 3.8 10*3/uL (ref 1.7–7.7)
Neutrophils Relative %: 54 % (ref 43–77)
Platelets: 249 10*3/uL (ref 150–400)
RBC: 5.04 MIL/uL (ref 4.22–5.81)
RDW: 13.2 % (ref 11.5–15.5)
WBC: 7.1 10*3/uL (ref 4.0–10.5)

## 2014-08-01 LAB — COMPLETE METABOLIC PANEL WITH GFR
ALT: 23 U/L (ref 0–53)
AST: 35 U/L (ref 0–37)
Albumin: 4.8 g/dL (ref 3.5–5.2)
Alkaline Phosphatase: 73 U/L (ref 39–117)
BUN: 12 mg/dL (ref 6–23)
CALCIUM: 9.1 mg/dL (ref 8.4–10.5)
CHLORIDE: 103 meq/L (ref 96–112)
CO2: 24 mEq/L (ref 19–32)
CREATININE: 1.09 mg/dL (ref 0.50–1.35)
GFR, EST NON AFRICAN AMERICAN: 80 mL/min
GFR, Est African American: >89 mL/min
GLUCOSE: 80 mg/dL (ref 70–99)
Potassium: 3.9 mEq/L (ref 3.5–5.3)
Sodium: 137 mEq/L (ref 135–145)
Total Bilirubin: 0.5 mg/dL (ref 0.2–1.2)
Total Protein: 7.2 g/dL (ref 6.0–8.3)

## 2014-08-01 LAB — VITAMIN B12: Vitamin B-12: 555 pg/mL (ref 211–911)

## 2014-08-01 MED ORDER — METOPROLOL SUCCINATE ER 25 MG PO TB24: 25.0000 mg | ORAL_TABLET | Freq: Every day | ORAL | Status: DC

## 2014-08-01 MED ORDER — CLONAZEPAM 1 MG PO TABS: ORAL_TABLET | ORAL | Status: DC

## 2014-08-01 MED ORDER — FLUOXETINE HCL 40 MG PO CAPS: 40.0000 mg | ORAL_CAPSULE | Freq: Every day | ORAL | Status: DC

## 2014-08-01 NOTE — Patient Instructions (Addendum)
You should receive a call or letter about your lab results within the next week to 10 days.  Keep a record of your blood pressures outside of the office and bring them to the next office visit.  If running over 140/90 persistently outside of office - return to discuss medicine changes. Try over the counter flonase or claritin over the counter for your ear as it appears to be clear fluid at this point. Return to the clinic or go to the nearest emergency room if any of your symptoms worsen or new symptoms occur. No change in prozac or klonopin dose for now. Keep follow up with psychologist and then psychiatrist in April.  Keeping you healthy  Get these tests  Blood pressure- Have your blood pressure checked once a year by your healthcare provider.  Normal blood pressure is 120/80.  Weight- Have your body mass index (BMI) calculated to screen for obesity.  BMI is a measure of body fat based on height and weight. You can also calculate your own BMI at [employer]velB[employer].it.  Cholesterol- Have your cholesterol checked regularly starting at age 68, sooner may be necessary if you have diabetes, high blood pressure, if a family member developed heart diseases at an early age or if you smoke.   Chlamydia, HIV, and other sexual transmitted disease- Get screened each year until the age of 3 then within three months of each new sexual partner.  Diabetes- Have your blood sugar checked regularly if you have high blood pressure, high cholesterol, a family history of diabetes or if you are overweight.  Get these vaccines  Flu shot- Every fall.  Tetanus shot- Every 10 years.  Menactra- Single dose; prevents meningitis.  Take these steps  Don't smoke- If you do smoke, ask your healthcare provider about quitting. For tips on how to quit, go to www.smokefree.gov or call 1-800-QUIT-NOW.  Be physically active- Exercise 5 days a week for at least 30 minutes.  If you are not already physically active  start slow and gradually work up to 30 minutes of moderate physical activity.  Examples of moderate activity include walking briskly, mowing the yard, dancing, swimming bicycling, etc.  Eat a healthy diet- Eat a variety of healthy foods such as fruits, vegetables, low fat milk, low fat cheese, yogurt, lean meats, poultry, fish, beans, tofu, etc.  For more information on healthy eating, go to www.thenutritionsource.org  Drink alcohol in moderation- Limit alcohol intake two drinks or less a day.  Never drink and drive.  Dentist- Brush and floss teeth twice daily; visit your dentis twice a year.  Depression-Your emotional health is as important as your physical health.  If you're feeling down, losing interest in things you normally enjoy please talk with your healthcare provider.  Gun Safety- If you keep a gun in your home, keep it unloaded and with the safety lock on.  Bullets should be stored separately.  Helmet use- Always wear a helmet when riding a motorcycle, bicycle, rollerblading or skateboarding.  Safe sex- If you may be exposed to a sexually transmitted infection, use a condom  Seat belts- Seat bels can save your life; always wear one.  Smoke/Carbon Monoxide detectors- These detectors need to be installed on the appropriate level of your home.  Replace batteries at least once a year.  Skin Cancer- When out in the sun, cover up and use sunscreen SPF 15 or higher.  Violence- If anyone is threatening or hurting you, please tell your healthcare provider.   Serous  Otitis Media Serous otitis media is fluid in the middle ear space. This space contains the bones for hearing and air. Air in the middle ear space helps to transmit sound.  The air gets there through the eustachian tube. This tube goes from the back of the nose (nasopharynx) to the middle ear space. It keeps the pressure in the middle ear the same as the outside world. It also helps to drain fluid from the middle ear  space. CAUSES  Serous otitis media occurs when the eustachian tube gets blocked. Blockage can come from: 6. Ear infections. 7. Colds and other upper respiratory infections. 8. Allergies. 9. Irritants such as cigarette smoke. 10. Sudden changes in air pressure (such as descending in an airplane). 11. Enlarged adenoids. 12. A mass in the nasopharynx. During colds and upper respiratory infections, the middle ear space can become temporarily filled with fluid. This can happen after an ear infection also. Once the infection clears, the fluid will generally drain out of the ear through the eustachian tube. If it does not, then serous otitis media occurs. SIGNS AND SYMPTOMS  4. Hearing loss. 5. A feeling of fullness in the ear, without pain. 6. Young children may not show any symptoms but may show slight behavioral changes, such as agitation, ear pulling, or crying. DIAGNOSIS  Serous otitis media is diagnosed by an ear exam. Tests may be done to check on the movement of the eardrum. Hearing exams may also be done. TREATMENT  The fluid most often goes away without treatment. If allergy is the cause, allergy treatment may be helpful. Fluid that persists for several months may require minor surgery. A small tube is placed in the eardrum to: 14. Drain the fluid. 15. Restore the air in the middle ear space. In certain situations, antibiotic medicines are used to avoid surgery. Surgery may be done to remove enlarged adenoids (if this is the cause). HOME CARE INSTRUCTIONS   Keep children away from tobacco smoke.  Keep all follow-up visits as directed by your health care provider. SEEK MEDICAL CARE IF:   Your hearing is not better in 3 months.  Your hearing is worse.  You have ear pain.  You have drainage from the ear.  You have dizziness.  You have serous otitis media only in one ear or have any bleeding from your nose (epistaxis).  You notice a lump on your neck. MAKE SURE  YOU:  Understand these instructions.   Will watch your condition.   Will get help right away if you are not doing well or get worse.  Document Released: 12/07/2003 Document Revised: 01/31/2014 Document Reviewed: 04/13/2013 [facility] Patient Information 2015 [place], [place]. This information is not intended to replace advice given to you by your health care provider. Make sure you discuss any questions you have with your health care provider.

## 2014-08-01 NOTE — Progress Notes (Addendum)
Subjective:    Patient ID: Jason [name], male    DOB: 1965/10/29, 48 y.o.   MRN: [id number] This chart was scribed for Jason Agreste, MD by Cathie Hoops, ED Scribe. The patient was seen in Room 23. The patient's care was started at 4:44 PM.   08/01/2014  Chief Complaint  Patient presents with  . Annual Exam  . Depression    see screen Klonopin helps him sleep has appt with psychiatrist but not until April    HPI He is here for physical annual exam as well as follow-up of other issues with last office visit with me in April of this year. See prior office visits and patient emails regarding his complicated past medical history including recurrent abdominal pain and other questions. See new paragraph for abdominal pain, followed by Dr. Coralie Keens at [facility] with most recent visit August 25. Planned for MR-enterography at [facility]. Per e-mail in September followed by Dr. Valentino Nose at [facility] with possible celiac disease and gastroparesis.   HPI Comments: Jason [name] is a 48 y.o. male who presents to the [facility] for annual physical exam. Pt complains of new, moderate, gradually worsening right ear pain onset one week ago. Pt notes tinnitus, ear discharge, and muffled sound. He denies any recent swimming or being underwater. Pt states he does use Q-tips. He denies congestion or cold prior to symptoms beginning. Pt denies fevers.  Annual Exam  Exercise Pt walks 30-45 minutes daily.  Dentist Pt has seen a dentist in the last 6 months.  Optho/ Eye Care Eval Pt denies having seen a ophthalmologist within the last year.    Colonoscopy- Multiple colonoscopies in June, July.  Immunizations  1.) Tdap- April 2012  2.) Influenza- given today  STI Testing HIV, RPR, Hep-C antibody, chlamydia, gonorrhea and HSV-2 testing negative in September 2014. Pt states he still has the same partner, pt denies any new partners.   Anxiety Last discussed  in office in April. He has GAD with multiple social stressors. In counseling prior. Meditation and exercise in the past. He has had some verbal altercations with his partner in the past but due to his lease, partner would not leave but denied any safety concerns at home or physical altercations. Increased to 40 mg q.d of Prozac and continued Klonopin 1 mg BID prn, this was last filled on October 21.  Pt currently takes Klonopin once/day. Pt states he normally takes two Klonopin with his Prozac together at night. Pt still sees a psychologists about once/month. He notes he has attempted to cut down his Klonopin dose but notes he experiences insomnia and his mind races. Pt states he has an appointment with psychiatry in April 2016. Pt is unable to specify if there are any sexual side effects with his medications due to his low Testosterone levels.  Hyperlipidemia  Slightly elevated 6 months ago but based on tenure ASCVD risk did not start statin, planned to recheck levels in one year.  Pt notes his total cholesterol was 187 in June when he got metabolic testing at work.  HTN Pt occasionally checks his BP at home with low 120s over 70s.  GI/Abdominal Pain Pt states he went to GI he has Small Intestinal Bowel Growth, SIGB, and they did two weeks of antibiotics and he will return on 11/6. He states he had two colonoscopies and two endoscopies with conflicting results. He has some follow-up tests. Pt complains of fatigue.  Testosterone, Low Vitamin D & Low Vitamin B12 Per most recent e-mail last month, admitted to having low Testosterone of 112 and Vitamin B-12 and Vitamin D. Had Testerone injection and Vitamin D injection by physician in [place]. Discussed with e-mail reply, that I can check for these levels here and depending on results including two, low leading document low Testosterone readings have the option of treating here or with endocrinologist. Low test of Testosterone in July of 101 at  8:26 AM. Vitamin B12 was 227 on the same date with Dr. Myriam Forehand. Pt states he did some blood test when he saw Dr. Myriam Forehand. Pt states he was told to take Vitamin D supplements due to a level of 39. He was told to take 2000 units everyday.  Review of Systems  Constitutional: Positive for fatigue. Negative for fever and chills.  HENT: Positive for ear discharge, ear pain and tinnitus.   Gastrointestinal: Negative for nausea and vomiting.   13 point review of systems per patient health survey noted.  Negative other than as indicated on reviewed nursing note or above.  Abdominal sx's chronic, without acute changes, no new testicular sx's, only new symptoms on ROS are ear symptoms as addressed above.    Past Medical History  Diagnosis Date  . Esophageal reflux   . Irritable bowel syndrome   . Diverticulosis of colon (without mention of hemorrhage)   . Unspecified gastritis and gastroduodenitis without mention of hemorrhage   . Other and unspecified hyperlipidemia   . Anxiety state, unspecified   . IBS (irritable bowel syndrome)   . Peptic ulcer   . Hernia   . Blood transfusion march 2012  . Hiatal hernia   . PONV (postoperative nausea and vomiting)     bowel blockage after may 2012 surgery  . Family history of malignant neoplasm of gastrointestinal tract   . Recurrent upper respiratory infection (URI)     bronchitis- 10/2011- treated /w antbx- by PCP  . Pneumonia     [facility]  . Anemia     anemic- 11/2010, post bleeding ulcer - treated ./w   bld. transfusion   . Arthritis     back, neck , shoulder   . Spondylitis, ankylosing     lower back issues related to AS  . Unspecified essential hypertension     Dr. Stanford Breed- consulted /w pt.  (2008)due to ^ heartrate & SOB /w climbing stairs, had echo /stress- wnl. Currently PCP manages BP    . Blood transfusion without reported diagnosis   . IBS (irritable bowel syndrome)   . Diverticulitis   . Allergy    Past Surgical History    Procedure Laterality Date  . Umbilical hernia repair  01/2011    1998,  01/2011 & 11/01/2011  . Cervical fusion   2007    c 4   . Stomach surgery  march 2012    bleeding ulcer  . Back surgery      X3, lower  back  . Appendectomy  2000  . Laparoscopy  11/01/2011    Procedure: LAPAROSCOPY DIAGNOSTIC;  Surgeon: Harl Bowie, MD;  Location: [facility];  Service: General;  Laterality: N/A;  . Laparotomy  01/10/2012    Procedure: EXPLORATORY LAPAROTOMY;  Surgeon: Harl Bowie, MD;  Location: [place];  Service: General;  Laterality: N/A;  . Bowel resection  01/10/2012    Procedure: SMALL BOWEL RESECTION;  Surgeon: Harl Bowie, MD;  Location: [place];  Service: General;  Laterality: N/A;  .  Small intestine surgery  12/2011  . Hernia repair  11/01/2011    incisional hernia  . Colon surgery    . Spine surgery     Allergies  Allergen Reactions  . Hydrocodone-Acetaminophen Palpitations    Increased heart rate   Current Outpatient Prescriptions  Medication Sig Dispense Refill  . clonazePAM (KLONOPIN) 1 MG tablet TAKE 1 TABLET BY MOUTH TWICE A DAY AS NEEDED FOR ANXIETY 60 tablet 0  . FLUoxetine (PROZAC) 40 MG capsule Take 1 capsule (40 mg total) by mouth daily. 90 capsule 3  . hyoscyamine (LEVBID) 0.375 MG 12 hr tablet     . metoprolol succinate (TOPROL-XL) 25 MG 24 hr tablet Take 1 tablet (25 mg total) by mouth daily. 90 tablet 1   No current facility-administered medications for this visit.       Objective:   Filed Vitals:   08/01/14 1551 08/01/14 1557  BP:  140/94  Pulse:  90  Temp:  98.1 F (36.7 C)  Resp:  16  Height: 5\' 10"  (1.778 m) 5\' 10"  (1.778 m)  Weight: 218 lb (98.884 kg) 218 lb (98.884 kg)  SpO2:  98%    Physical Exam  Constitutional: He is oriented to person, place, and time. He appears well-developed and well-nourished.  HENT:  Head: Normocephalic and atraumatic.  Right Ear: External ear normal. Tympanic membrane is not erythematous.  Left Ear: External ear  normal. Tympanic membrane is not erythematous.  Mouth/Throat: Oropharynx is clear and moist.  TM is pearly grey is without signs of rupture or discharge. Canal is not erythematous or edematous. No redness of the TM. Clear fluid at base of R tm.   Eyes: Conjunctivae and EOM are normal. Pupils are equal, round, and reactive to light.  Neck: Normal range of motion. Neck supple. No JVD present. Carotid bruit is not present. No thyromegaly present.  Cardiovascular: Normal rate, regular rhythm, normal heart sounds and intact distal pulses.   No murmur heard. Pulmonary/Chest: Effort normal and breath sounds normal. No respiratory distress. He has no wheezes. He has no rales.  Abdominal: Soft. He exhibits no distension. There is no tenderness. Hernia confirmed negative in the right inguinal area and confirmed negative in the left inguinal area.  Hyperactive bs.   Genitourinary: Prostate normal. Right testis shows no mass and no swelling. Left testis shows no mass and no swelling.  Musculoskeletal: Normal range of motion. He exhibits no edema or tenderness.  Lymphadenopathy:    He has no cervical adenopathy.  Neurological: He is alert and oriented to person, place, and time. He has normal reflexes.  Skin: Skin is warm and dry.  Psychiatric: He has a normal mood and affect. His behavior is normal. Judgment and thought content normal.  Nursing note and vitals reviewed.     Assessment & Plan:  5:04 PM- Patient informed of current plan for treatment and evaluation and agrees with plan at this time.  FREDIE MAJANO is a 48 y.o. male Annual physical exam  --anticipatory guidance as below in AVS, screening labs above. Health maintenance items as above in HPI discussed/recommended as applicable.   Low testosterone - Plan: Testosterone, free, total  -prior significantly low.  Afternoon blood draw at this visit, but with prior reading, if low - may only need one am check to verify. Consider endocrine eval  with this low of a level as other testing may be needed.   Anxiety - Plan: FLUoxetine (PROZAC) 40 MG capsule, GAD (generalized anxiety disorder) -  Plan: FLUoxetine (PROZAC) 40 MG capsule Adjustment disorder with mixed anxiety and depressed mood  -social and health stressors. Denies safety concerns at home. Continue BID dosing of Klonopin and prozac 40mg  qd, as well as counseling for now.  Planning on following up with psychiatry in next spring, can follow here in meantime. 1 month and 1 refill of Klonopin - call for next refill   Other fatigue - Plan: CBC with Differential, Vit D  25 hydroxy (rtn osteoporosis monitoring), Vitamin B12  -anxiety and intestinal illness may be contributory. Although told vit d and B12 low prior, these levels were still in normal range on lab cutoff. Recheck labs.   Generalized abdominal pain, Small intestinal bacterial overgrowthCeliac disease - Plan: CBC with Differential  -cont GI follow up.  ?SIBO and possible celiac dz.   Essential hypertension - Plan: CBC with Differential, COMPLETE METABOLIC PANEL WITH GFR, metoprolol succinate (TOPROL-XL) 25 MG 24 hr tablet  -borderline here. Check home BP's and if remains elevated - RTC to discuss further.   Screening for prostate cancer - Plan: PSA  -baseline if planning on testosterone treatment.    Meds ordered this encounter  Medications  . FLUoxetine (PROZAC) 40 MG capsule    Sig: Take 1 capsule (40 mg total) by mouth daily.    Dispense:  90 capsule    Refill:  1  . metoprolol succinate (TOPROL-XL) 25 MG 24 hr tablet    Sig: Take 1 tablet (25 mg total) by mouth daily.    Dispense:  90 tablet    Refill:  1  . clonazePAM (KLONOPIN) 1 MG tablet    Sig: TAKE 1 to 2 TABLETs BY MOUTH up to TWICE A DAY AS NEEDED FOR ANXIETY    Dispense:  60 tablet    Refill:  1    Not to exceed 5 additional fills before 11/06/2014   Patient Instructions  You should receive a call or letter about your lab results within the next  week to 10 days.  Keep a record of your blood pressures outside of the office and bring them to the next office visit.  If running over 140/90 persistently outside of office - return to discuss medicine changes. Try over the counter flonase or claritin over the counter for your ear as it appears to be clear fluid at this point. Return to the clinic or go to the nearest emergency room if any of your symptoms worsen or new symptoms occur. No change in prozac or klonopin dose for now. Keep follow up with psychologist and then psychiatrist in April.  Keeping you healthy  Get these tests  Blood pressure- Have your blood pressure checked once a year by your healthcare provider.  Normal blood pressure is 120/80.  Weight- Have your body mass index (BMI) calculated to screen for obesity.  BMI is a measure of body fat based on height and weight. You can also calculate your own BMI at [employer]velBags.it.  Cholesterol- Have your cholesterol checked regularly starting at age 54, sooner may be necessary if you have diabetes, high blood pressure, if a family member developed heart diseases at an early age or if you smoke.   Chlamydia, HIV, and other sexual transmitted disease- Get screened each year until the age of 20 then within three months of each new sexual partner.  Diabetes- Have your blood sugar checked regularly if you have high blood pressure, high cholesterol, a family history of diabetes or if you are overweight.  Get these  vaccines  Flu shot- Every fall.  Tetanus shot- Every 10 years.  Menactra- Single dose; prevents meningitis.  Take these steps  Don't smoke- If you do smoke, ask your healthcare provider about quitting. For tips on how to quit, go to www.smokefree.gov or call 1-800-QUIT-NOW.  Be physically active- Exercise 5 days a week for at least 30 minutes.  If you are not already physically active start slow and gradually work up to 30 minutes of moderate physical activity.   Examples of moderate activity include walking briskly, mowing the yard, dancing, swimming bicycling, etc.  Eat a healthy diet- Eat a variety of healthy foods such as fruits, vegetables, low fat milk, low fat cheese, yogurt, lean meats, poultry, fish, beans, tofu, etc.  For more information on healthy eating, go to www.thenutritionsource.org  Drink alcohol in moderation- Limit alcohol intake two drinks or less a day.  Never drink and drive.  Dentist- Brush and floss teeth twice daily; visit your dentis twice a year.  Depression-Your emotional health is as important as your physical health.  If you're feeling down, losing interest in things you normally enjoy please talk with your healthcare provider.  Gun Safety- If you keep a gun in your home, keep it unloaded and with the safety lock on.  Bullets should be stored separately.  Helmet use- Always wear a helmet when riding a motorcycle, bicycle, rollerblading or skateboarding.  Safe sex- If you may be exposed to a sexually transmitted infection, use a condom  Seat belts- Seat bels can save your life; always wear one.  Smoke/Carbon Monoxide detectors- These detectors need to be installed on the appropriate level of your home.  Replace batteries at least once a year.  Skin Cancer- When out in the sun, cover up and use sunscreen SPF 15 or higher.  Violence- If anyone is threatening or hurting you, please tell your healthcare provider.   Serous Otitis Media Serous otitis media is fluid in the middle ear space. This space contains the bones for hearing and air. Air in the middle ear space helps to transmit sound.  The air gets there through the eustachian tube. This tube goes from the back of the nose (nasopharynx) to the middle ear space. It keeps the pressure in the middle ear the same as the outside world. It also helps to drain fluid from the middle ear space. CAUSES  Serous otitis media occurs when the eustachian tube gets blocked.  Blockage can come from: 6. Ear infections. 7. Colds and other upper respiratory infections. 8. Allergies. 9. Irritants such as cigarette smoke. 10. Sudden changes in air pressure (such as descending in an airplane). 11. Enlarged adenoids. 12. A mass in the nasopharynx. During colds and upper respiratory infections, the middle ear space can become temporarily filled with fluid. This can happen after an ear infection also. Once the infection clears, the fluid will generally drain out of the ear through the eustachian tube. If it does not, then serous otitis media occurs. SIGNS AND SYMPTOMS  4. Hearing loss. 5. A feeling of fullness in the ear, without pain. 6. Young children may not show any symptoms but may show slight behavioral changes, such as agitation, ear pulling, or crying. DIAGNOSIS  Serous otitis media is diagnosed by an ear exam. Tests may be done to check on the movement of the eardrum. Hearing exams may also be done. TREATMENT  The fluid most often goes away without treatment. If allergy is the cause, allergy treatment may be helpful.  Fluid that persists for several months may require minor surgery. A small tube is placed in the eardrum to: 14. Drain the fluid. 15. Restore the air in the middle ear space. In certain situations, antibiotic medicines are used to avoid surgery. Surgery may be done to remove enlarged adenoids (if this is the cause). HOME CARE INSTRUCTIONS   Keep children away from tobacco smoke.  Keep all follow-up visits as directed by your health care provider. SEEK MEDICAL CARE IF:   Your hearing is not better in 3 months.  Your hearing is worse.  You have ear pain.  You have drainage from the ear.  You have dizziness.  You have serous otitis media only in one ear or have any bleeding from your nose (epistaxis).  You notice a lump on your neck. MAKE SURE YOU:  Understand these instructions.   Will watch your condition.   Will get help right  away if you are not doing well or get worse.  Document Released: 12/07/2003 Document Revised: 01/31/2014 Document Reviewed: 04/13/2013 [facility] Patient Information 2015 [place], [place]. This information is not intended to replace advice given to you by your health care provider. Make sure you discuss any questions you have with your health care provider.         I personally performed the services described in this documentation, which was scribed in my presence. The recorded information has been reviewed and considered, and addended by me as needed.

## 2014-08-02 LAB — VITAMIN D 25 HYDROXY (VIT D DEFICIENCY, FRACTURES): Vit D, 25-Hydroxy: 54 ng/mL (ref 30–89)

## 2014-08-02 LAB — PSA: PSA: 0.78 ng/mL (ref <=4.00)

## 2014-08-08 ENCOUNTER — Encounter: Payer: Self-pay | Admitting: Family Medicine

## 2014-10-08 ENCOUNTER — Other Ambulatory Visit: Payer: Self-pay | Admitting: Family Medicine

## 2014-10-19 ENCOUNTER — Other Ambulatory Visit: Payer: Self-pay | Admitting: Family Medicine

## 2014-10-23 ENCOUNTER — Encounter: Payer: Self-pay | Admitting: Family Medicine

## 2014-10-23 NOTE — Telephone Encounter (Signed)
Refilled.  Can call in rx or I am in office tomorrow to sign.

## 2014-10-23 NOTE — Telephone Encounter (Signed)
Called in.

## 2014-10-23 NOTE — Telephone Encounter (Signed)
Script has been called into the pharmacy as requested. Pt sent a message via mychart to notify.

## 2014-10-23 NOTE — Telephone Encounter (Signed)
From  Alphonse Guild Carcamo   To  [name], MD   Sent  10/23/2014 1:25 PM      Can I get a refill for the klonopin I am seeing a psychiatrist at [facility] at the end of Feb who will hopefully take over prescribing me the klonopin and I am also seeing a psychologist there as well for counseling. Can the refill be called in at [facility] on [address]. I have moved and transferred my prescription to that side of [place] near where I moved to [place], [place]

## 2014-12-17 ENCOUNTER — Other Ambulatory Visit: Payer: Self-pay | Admitting: Family Medicine

## 2015-02-01 ENCOUNTER — Other Ambulatory Visit: Payer: Self-pay | Admitting: Physician Assistant

## 2015-03-28 ENCOUNTER — Encounter: Payer: Self-pay | Admitting: Family Medicine

## 2015-04-23 ENCOUNTER — Other Ambulatory Visit: Payer: Self-pay | Admitting: Physician Assistant

## 2015-05-27 ENCOUNTER — Other Ambulatory Visit: Payer: Self-pay | Admitting: Physician Assistant

## 2015-06-23 ENCOUNTER — Other Ambulatory Visit: Payer: Self-pay | Admitting: Physician Assistant

## 2015-06-24 ENCOUNTER — Other Ambulatory Visit: Payer: Self-pay | Admitting: Physician Assistant

## 2015-07-24 ENCOUNTER — Ambulatory Visit (INDEPENDENT_AMBULATORY_CARE_PROVIDER_SITE_OTHER): Payer: BLUE CROSS/BLUE SHIELD | Admitting: Family Medicine

## 2015-07-24 ENCOUNTER — Encounter: Payer: Self-pay | Admitting: Family Medicine

## 2015-07-24 VITALS — BP 104/71 | HR 69 | Temp 99.2°F | Resp 16 | Ht 70.5 in | Wt 191.8 lb

## 2015-07-24 DIAGNOSIS — Z8249 Family history of ischemic heart disease and other diseases of the circulatory system: Secondary | ICD-10-CM | POA: Diagnosis not present

## 2015-07-24 DIAGNOSIS — R0609 Other forms of dyspnea: Secondary | ICD-10-CM | POA: Diagnosis not present

## 2015-07-24 DIAGNOSIS — I10 Essential (primary) hypertension: Secondary | ICD-10-CM

## 2015-07-24 DIAGNOSIS — Z87898 Personal history of other specified conditions: Secondary | ICD-10-CM | POA: Diagnosis not present

## 2015-07-24 DIAGNOSIS — Z23 Encounter for immunization: Secondary | ICD-10-CM

## 2015-07-24 MED ORDER — METOPROLOL SUCCINATE ER 25 MG PO TB24: 12.5000 mg | ORAL_TABLET | Freq: Every day | ORAL | Status: DC

## 2015-07-24 NOTE — Progress Notes (Addendum)
Subjective:  This chart was scribed for [name], MD by Thea Alken, ED Scribe. This patient was seen in room 23 and the patient's care was started at 4:59 PM.   Patient ID: Jason [name], male    DOB: [birth year], 48 y.o.   MRN: [id number]  HPI Chief Complaint  Patient presents with  . Medication Refill    Toprol-XL   HPI Comments: Jason [name] is a 49 y.o. male who presents to the [facility] follow up.  Hypertension He is on toprol 25 mg qd  Lab Results  Component Value Date   CREATININE 1.09 08/01/2014   Home readings have been 120/70. He's lost 35 lbs through exercise. Tolerating Toprol well, has not tried splitting pill in half. He becomes short winded with running but not with walking or climbing steps. He denies light headiness or dizziness with exercise.  Exercise stress test 12/2012 by Sherin Quarry protocol for 9 minutes with a work load of 10 mets, would stop due to fatigue. Pt reports hx of heart problem, his father passed away from massive MI, mother also had heart problems.    Depression with anxiety See prior visit. Has been followed by psychologist previously. When last seen 2015 he was on prozoac 40 mg a day and klonopin. Last refill of klonopin, earlier this year.    Pt states this has been a rough year. He has been dealing with family stress. His 63 y.o. son had 3rd degree burn in April after a battery exploded in his pocket. His middle son and wife have miscarried 5 babies and currently have a baby on the way. Work is also stressful. Pt living safety is currently the same. He is on Wellbutrin and Topamax prescribed by [name] Who he see's at a new treatment center. He is comfortable with Wellbutrin.    Pt last ate 7 hours ago.   Patient Active Problem List   Diagnosis Date Noted  . Chest pain 12/24/2012  . Duodenal ulcer, unspecified as acute or chronic, without  hemorrhage, perforation, or obstruction 01/14/2011  . CONSTIPATION 07/22/2008  . RECTAL BLEEDING 07/22/2008  . ABDOMINAL PAIN, UNSPECIFIED SITE 07/22/2008  . HYPERLIPIDEMIA 11/20/2007  . ANXIETY 11/20/2007  . HYPERTENSION 11/20/2007  . GERD 11/20/2007  . IRRITABLE BOWEL SYNDROME 11/20/2007  . BACK PAIN, CHRONIC 11/20/2007  . HEADACHE, CHRONIC 11/20/2007  . PALPITATIONS 11/20/2007  . DIVERTICULOSIS, COLON 07/06/2007  . GASTRITIS 01/22/2001  . HIATAL HERNIA 01/22/2001   Past Medical History  Diagnosis Date  . Esophageal reflux   . Irritable bowel syndrome   . Diverticulosis of colon (without mention of hemorrhage)   . Unspecified gastritis and gastroduodenitis without mention of hemorrhage   . Other and unspecified hyperlipidemia   . Anxiety state, unspecified   . IBS (irritable bowel syndrome)   . Peptic ulcer   . Hernia   . Blood transfusion march 2012  . Hiatal hernia   . PONV (postoperative nausea and vomiting)     bowel blockage after may 2012 surgery  . Family history of malignant neoplasm of gastrointestinal tract   . Recurrent upper respiratory infection (URI)     bronchitis- 10/2011- treated /  w antbx- by PCP  . Pneumonia     1991[facility]  . Anemia     anemic- 11/2010, post bleeding ulcer - treated ./w   bld. transfusion   . Arthritis     back, neck , shoulder   . 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 Unspecified essential hypertension     Dr. Stanford Breed- consulted /w pt.  (2008)due to ^ heartrate & SOB /w climbing stairs, had echo /stress- wnl. Currently PCP manages BP    . Blood transfusion without reported diagnosis   . IBS (irritable bowel syndrome)   . Diverticulitis   . Allergy    Past Surgical History  Procedure Laterality Date  . Umbilical hernia repair  01/2011    1998,  01/2011 & 11/01/2011  . Cervical fusion   2007    c 4   . Stomach surgery  march 2012    bleeding ulcer  . Back surgery      X3, lower  back  .  Appendectomy  2000  . Laparoscopy  11/01/2011    Procedure: LAPAROSCOPY DIAGNOSTIC;  Surgeon: Harl Bowie, MD;  Location: [facility];  Service: General;  Laterality: N/A;  . Laparotomy  01/10/2012    Procedure: EXPLORATORY LAPAROTOMY;  Surgeon: Harl Bowie, MD;  Location: [place];  Service: General;  Laterality: N/A;  . Bowel resection  01/10/2012    Procedure: SMALL BOWEL RESECTION;  Surgeon: Harl Bowie, MD;  Location: [place];  Service: General;  Laterality: N/A;  . Small intestine surgery  12/2011  . Hernia repair  11/01/2011    incisional hernia  . Colon surgery    . Spine surgery     Allergies  Allergen Reactions  . Hydrocodone-Acetaminophen Palpitations    Increased heart rate   Prior to Admission medications   Medication Sig Start Date End Date Taking? Authorizing Provider  buPROPion (WELLBUTRIN XL) 300 MG 24 hr tablet Take 300 mg by mouth daily.   Yes Historical Provider, MD  clonazePAM (KLONOPIN) 1 MG tablet TAKE 1 TO 2 TABLETS BY MOUTH TWICE A DAY AS NEEDED FOR ANXIETY 10/23/14  Yes [name], MD  metoprolol succinate (TOPROL-XL) 25 MG 24 hr tablet TAKE 1 TABLET BY MOUTH EVERY DAY (NO MORE REFILLS WITHOUT OV) 06/24/15  Yes Chelle Jeffery, PA-C  topiramate (TOPAMAX) 100 MG tablet Take 100 mg by mouth 2 (two) times daily.   Yes Historical Provider, MD  FLUoxetine (PROZAC) 40 MG capsule Take 1 capsule (40 mg total) by mouth daily. Patient not taking: Reported on 07/24/2015 08/01/14   [name], MD  hyoscyamine (LEVBID) 0.375 MG 12 hr tablet  05/11/14   Historical Provider, MD   Social History   Social History  . Marital Status: Significant Other    Spouse Name: N/A  . Number of Children: 2  . Years of Education: N/A   Occupational History  . quality analyst   .  [employer]   Social History Main Topics  . Smoking status: Never Smoker   . Smokeless tobacco: Never Used  . Alcohol Use: Yes     Comment: rarely, 1 or 2 a week  . Drug Use: No  .  Sexual Activity: Yes     Comment: number of sex partners in the last 33 months 1   Other Topics Concern  . Not on file   Social History Narrative   Exercise walking and running 2 x/weekfor  45 minutes to 1 hour   Review of Systems  Constitutional: Negative for fatigue and unexpected weight change.  Eyes: Negative for visual disturbance.  Respiratory: Negative for cough, chest tightness and shortness of breath.   Cardiovascular: Negative for chest pain, palpitations and leg swelling.  Gastrointestinal: Negative for abdominal pain and blood in stool.  Neurological: Negative for dizziness, light-headedness and headaches.    Objective:   Physical Exam  Constitutional: He is oriented to person, place, and time. He appears well-developed and well-nourished. No distress.  HENT:  Head: Normocephalic and atraumatic.  Eyes: Conjunctivae and EOM are normal.  Neck: Neck supple.  Cardiovascular: Normal rate.   Pulmonary/Chest: Effort normal.  Abdominal: Soft.  Musculoskeletal: Normal range of motion.  Neurological: He is alert and oriented to person, place, and time.  Skin: Skin is warm and dry.  Psychiatric: He has a normal mood and affect. His behavior is normal.  Nursing note and vitals reviewed.  Filed Vitals:   07/24/15 1626  BP: 104/71  Pulse: 69  Temp: 99.2 F (37.3 C)  TempSrc: Oral  Resp: 16  Height: 5' 10.5" (1.791 m)  Weight: 191 lb 12.8 oz (87 kg)   Assessment & Plan:   Jason [name] is a 49 y.o. male  Essential hypertension - Plan: Lipid panel, COMPLETE METABOLIC PANEL WITH GFR, metoprolol succinate (TOPROL-XL) 25 MG 24 hr tablet, Ambulatory referral to Cardiology, DISCONTINUED: metoprolol succinate (TOPROL-XL) 25 MG 24 hr tablet, DISCONTINUED: metoprolol succinate (TOPROL-XL) 25 MG 24 hr tablet  -with intentional wt loss, may not need as high of dose of toprol. Can try 1.2 dose with close eye on HR and BP, restart full doe if either elevate  Need for prophylactic  vaccination and inoculation against influenza - Plan: Flu Vaccine QUAD 36+ mos IM given.   Dyspnea on exertion - Plan: TSH, Ambulatory referral to Cardiology History of tachycardia - Plan: TSH, Ambulatory referral to Cardiology Family history of valvular heart disease  -easy exertion/dyspnea on exertion. Hx of tachycardia and FH of valvular heart disease - will have evaluated by cardiology to determine if echo or stress testing needed.   -RTC/ER precautions.    Meds ordered this encounter  Medications  . topiramate (TOPAMAX) 100 MG tablet    Sig: Take 100 mg by mouth 2 (two) times daily.  Marland Kitchen buPROPion (WELLBUTRIN XL) 300 MG 24 hr tablet    Sig: Take 300 mg by mouth daily.  Marland Kitchen DISCONTD: metoprolol succinate (TOPROL-XL) 25 MG 24 hr tablet    Sig: Take 0.5 tablets (12.5 mg total) by mouth daily. TAKE 1 TABLET BY MOUTH EVERY DAY (NO MORE REFILLS WITHOUT OV)    Dispense:  90 tablet    Refill:  1  . DISCONTD: metoprolol succinate (TOPROL-XL) 25 MG 24 hr tablet    Sig: Take 0.5 tablets (12.5 mg total) by mouth daily. TAKE 1 TABLET BY MOUTH EVERY DAY    Dispense:  90 tablet    Refill:  1  . metoprolol succinate (TOPROL-XL) 25 MG 24 hr tablet    Sig: Take 0.5 tablets (12.5 mg total) by mouth daily.    Dispense:  90 tablet    Refill:  1   Patient Instructions  You should receive a call or letter about your lab results within the next week to 10 days.  You can try to decrease dose of Toprol to 1/2 per day as this dose may control your pressure at your new weight.  For shortness of breath  with exercise and your family history of valve disease - I will refer you to cardiology to determine if echo or other workup needed.  Return to the clinic or go to the nearest emergency room if any of your symptoms worsen or new symptoms occur.         By signing my name below, I, Raven Small, attest that this documentation has been prepared under the direction and in the presence of [name], MD.    Electronically Signed: Thea Alken, ED Scribe. 07/24/2015. 5:36 PM.

## 2015-07-24 NOTE — Patient Instructions (Signed)
You should receive a call or letter about your lab results within the next week to 10 days.  You can try to decrease dose of Toprol to 1/2 per day as this dose may control your pressure at your new weight.  For shortness of breath with exercise and your family history of valve disease - I will refer you to cardiology to determine if echo or other workup needed.  Return to the clinic or go to the nearest emergency room if any of your symptoms worsen or new symptoms occur.

## 2015-07-25 LAB — LIPID PANEL
CHOL/HDL RATIO: 4.2 ratio (ref <=5.0)
Cholesterol: 208 mg/dL — ABNORMAL HIGH (ref 125–200)
HDL: 49 mg/dL (ref >=40)
LDL CALC: 145 mg/dL — AB (ref <130)
Triglycerides: 68 mg/dL (ref <150)
VLDL: 14 mg/dL (ref <30)

## 2015-07-25 LAB — COMPLETE METABOLIC PANEL WITH GFR
ALT: 12 U/L (ref 9–46)
AST: 20 U/L (ref 10–40)
Albumin: 4.7 g/dL (ref 3.6–5.1)
Alkaline Phosphatase: 76 U/L (ref 40–115)
BUN: 15 mg/dL (ref 7–25)
CHLORIDE: 110 mmol/L (ref 98–110)
CO2: 20 mmol/L (ref 20–31)
Calcium: 9.8 mg/dL (ref 8.6–10.3)
Creat: 1.31 mg/dL (ref 0.60–1.35)
GFR, EST NON AFRICAN AMERICAN: 64 mL/min (ref >=60)
GFR, Est African American: 74 mL/min (ref >=60)
GLUCOSE: 80 mg/dL (ref 65–99)
POTASSIUM: 4 mmol/L (ref 3.5–5.3)
SODIUM: 142 mmol/L (ref 135–146)
TOTAL PROTEIN: 7.5 g/dL (ref 6.1–8.1)
Total Bilirubin: 0.4 mg/dL (ref 0.2–1.2)

## 2015-07-25 LAB — TSH: TSH: 1.989 u[IU]/mL (ref 0.350–4.500)

## 2015-08-01 ENCOUNTER — Telehealth: Payer: Self-pay | Admitting: Family Medicine

## 2015-08-01 NOTE — Telephone Encounter (Signed)
Can we check to see if Mr. Keown has received these messages and plan? Thanks.

## 2015-08-01 NOTE — Telephone Encounter (Signed)
New Message  This message is to inform you that we have made 3 consecutive attempts to contact the patient since 07/25/2015 We have also mailed a letter to the patient to inform them to call in and schedule. Although we were unsuccessful in these attempts we wanted you to be aware of our efforts. Will remove the patient from our referral work queue at this time.     [facility]

## 2015-08-02 NOTE — Telephone Encounter (Signed)
Spoke with pt, advised message. He states he will call [facility].

## 2015-08-21 NOTE — Progress Notes (Signed)
HPI: FU chest pain, dyspnea and palpitations. Previous Myoview in November of 2008 showed no sign of scar or ischemia and ejection fraction was 67%. Echocardiogram in November 2008 showed normal LV function and trace aortic/mitral regurgitation. Previous monitor showed PACs. CTA in February of 2014 showed no pulmonary embolus. ETT 4/14 showed no CP and no ST changes. Since last seen, Patient states he has had increasing dyspnea on exertion. No orthopnea, PND, pedal edema or syncope. He has chest tightness with activities. His heart races with activities.  Current Outpatient Prescriptions  Medication Sig Dispense Refill  . buPROPion (WELLBUTRIN XL) 300 MG 24 hr tablet Take 300 mg by mouth daily.    . clonazePAM (KLONOPIN) 1 MG tablet TAKE 1 TO 2 TABLETS BY MOUTH TWICE A DAY AS NEEDED FOR ANXIETY 60 tablet 1  . metoprolol succinate (TOPROL-XL) 25 MG 24 hr tablet Take 0.5 tablets (12.5 mg total) by mouth daily. 90 tablet 1  . topiramate (TOPAMAX) 100 MG tablet Take 100 mg by mouth 2 (two) times daily.     No current facility-administered medications for this visit.     Past Medical History  Diagnosis Date  . Esophageal reflux   . Irritable bowel syndrome   . Diverticulosis of colon (without mention of hemorrhage)   . Unspecified gastritis and gastroduodenitis without mention of hemorrhage   . Other and unspecified hyperlipidemia   . Anxiety state, unspecified   . IBS (irritable bowel syndrome)   . Peptic ulcer   . Hernia   . Blood transfusion march 2012  . Hiatal hernia   . PONV (postoperative nausea and vomiting)     bowel blockage after may 2012 surgery  . Family history of malignant neoplasm of gastrointestinal tract   . Recurrent upper respiratory infection (URI)     bronchitis- 10/2011- treated /w antbx- by PCP  . Pneumonia     [facility]  . Anemia     anemic- 11/2010, post bleeding ulcer - treated ./w   bld. transfusion   . Arthritis     back, neck , shoulder   .  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 Unspecified essential hypertension     Dr. Stanford Breed- consulted /w pt.  (2008)due to ^ heartrate & SOB /w climbing stairs, had echo /stress- wnl. Currently PCP manages BP    . Blood transfusion without reported diagnosis   . IBS (irritable bowel syndrome)   . Diverticulitis   . Allergy     Past Surgical History  Procedure Laterality Date  . Umbilical hernia repair  01/2011    1998,  01/2011 & 11/01/2011  . Cervical fusion   2007    c 4   . Stomach surgery  march 2012    bleeding ulcer  . Back surgery      X3, lower  back  . Appendectomy  2000  . Laparoscopy  11/01/2011    Procedure: LAPAROSCOPY DIAGNOSTIC;  Surgeon: Harl Bowie, MD;  Location: [facility];  Service: General;  Laterality: N/A;  . Laparotomy  01/10/2012    Procedure: EXPLORATORY LAPAROTOMY;  Surgeon: Harl Bowie, MD;  Location: [place];  Service: General;  Laterality: N/A;  . Bowel resection  01/10/2012    Procedure: SMALL BOWEL RESECTION;  Surgeon: Harl Bowie, MD;  Location: [place];  Service: General;  Laterality: N/A;  . Small intestine surgery  12/2011  . Hernia repair  11/01/2011  incisional hernia  . Colon surgery    . Spine surgery      Social History   Social History  . Marital Status: Significant Other    Spouse Name: N/A  . Number of Children: 2  . Years of Education: N/A   Occupational History  . quality analyst   .  [employer]   Social History Main Topics  . Smoking status: Never Smoker   . Smokeless tobacco: Never Used  . Alcohol Use: Yes     Comment: rarely, 1 or 2 a week  . Drug Use: No  . Sexual Activity: Yes     Comment: number of sex partners in the last 21 months 1   Other Topics Concern  . Not on file   Social History Narrative   Exercise walking and running 2 x/weekfor 45 minutes to 1 hour    ROS: no fevers or chills, productive cough, hemoptysis, dysphasia, odynophagia, melena, hematochezia,  dysuria, hematuria, rash, seizure activity, orthopnea, PND, pedal edema, claudication. Remaining systems are negative.  Physical Exam: Well-developed well-nourished in no acute distress.  Skin is warm and dry.  HEENT is normal.  Neck is supple.  Chest is clear to auscultation with normal expansion.  Cardiovascular exam is regular rate and rhythm.  Abdominal exam nontender or distended. No masses palpated. Extremities show no edema. neuro grossly intact  ECG Sinus bradycardia at a rate of 56. No ST changes.

## 2015-08-28 ENCOUNTER — Encounter: Payer: Self-pay | Admitting: *Deleted

## 2015-08-28 ENCOUNTER — Ambulatory Visit (INDEPENDENT_AMBULATORY_CARE_PROVIDER_SITE_OTHER): Payer: BLUE CROSS/BLUE SHIELD | Admitting: Cardiology

## 2015-08-28 ENCOUNTER — Encounter: Payer: Self-pay | Admitting: Cardiology

## 2015-08-28 VITALS — BP 112/90 | HR 56 | Ht 71.0 in | Wt 191.0 lb

## 2015-08-28 DIAGNOSIS — R0600 Dyspnea, unspecified: Secondary | ICD-10-CM | POA: Diagnosis not present

## 2015-08-28 DIAGNOSIS — R079 Chest pain, unspecified: Secondary | ICD-10-CM

## 2015-08-28 DIAGNOSIS — R002 Palpitations: Secondary | ICD-10-CM

## 2015-08-28 DIAGNOSIS — I10 Essential (primary) hypertension: Secondary | ICD-10-CM | POA: Diagnosis not present

## 2015-08-28 DIAGNOSIS — E785 Hyperlipidemia, unspecified: Secondary | ICD-10-CM

## 2015-08-28 MED ORDER — ATORVASTATIN CALCIUM 20 MG PO TABS: 20.0000 mg | ORAL_TABLET | Freq: Every day | ORAL | Status: DC

## 2015-08-28 NOTE — Assessment & Plan Note (Signed)
Schedule exercise treadmill for risk stratification. 

## 2015-08-28 NOTE — Assessment & Plan Note (Signed)
Echocardiogram to assess LV function. 

## 2015-08-28 NOTE — Assessment & Plan Note (Signed)
Blood pressure controlled. Continue present medications. 

## 2015-08-28 NOTE — Assessment & Plan Note (Signed)
LDL elevated on recent laboratories. Strong family history of coronary disease. Add Lipitor 20 mg daily. Check lipids and liver in 4 weeks.

## 2015-08-28 NOTE — Assessment & Plan Note (Signed)
Continue beta blocker. 

## 2015-08-28 NOTE — Patient Instructions (Signed)
Medication Instructions:   START ATORVASTATIN 20 MG ONCE DAILY  Labwork:  Your physician recommends that you return for lab work in: 4 WEEKS= DO NOT EAT PRIOR TO LAB WORK  Testing/Procedures:  Your physician has requested that you have an echocardiogram. Echocardiography is a painless test that uses sound waves to create images of your heart. It provides your doctor with information about the size and shape of your heart and how well your heart's chambers and valves are working. This procedure takes approximately one hour. There are no restrictions for this procedure.   Your physician has requested that you have an exercise tolerance test. For further information please visit [email or web address]. Please also follow instruction sheet, as given.    Follow-Up:  Your physician wants you to follow-up in: [place] will receive a reminder letter in the mail two months in advance. If you don't receive a letter, please call our office to schedule the follow-up appointment.   If you need a refill on your cardiac medications before your next appointment, please call your pharmacy.   Exercise Stress Electrocardiogram An exercise stress electrocardiogram is a test to check how blood flows to your heart. It is done to find areas of poor blood flow. You will need to walk on a treadmill for this test. The electrocardiogram will record your heartbeat when you are at rest and when you are exercising. BEFORE THE PROCEDURE  Do not have drinks with caffeine or foods with caffeine for 24 hours before the test, or as told by your doctor. This includes coffee, tea (even decaf tea), sodas, chocolate, and cocoa.  Follow your doctor's instructions about eating and drinking before the test.  Ask your doctor what medicines you should or should not take before the test. Take your medicines with water unless told by your doctor not to.  If you use an inhaler, bring it with you to the  test.  Bring a snack to eat after the test.  Do not  smoke for 4 hours before the test.  Do not put lotions, powders, creams, or oils on your chest before the test.  Wear comfortable shoes and clothing. PROCEDURE  You will have patches put on your chest. Small areas of your chest may need to be shaved. Wires will be connected to the patches.  Your heart rate will be watched while you are resting and while you are exercising.  You will walk on the treadmill. The treadmill will slowly get faster to raise your heart rate.  The test will take about 1-2 hours. AFTER THE PROCEDURE  Your heart rate and blood pressure will be watched after the test.  You may return to your normal diet, activities, and medicines or as told by your doctor.   This information is not intended to replace advice given to you by your health care provider. Make sure you discuss any questions you have with your health care provider.   Document Released: 03/04/2008 Document Revised: 10/07/2014 Document Reviewed: 05/24/2013 Elsevier Interactive Patient Education [employer].

## 2015-09-14 ENCOUNTER — Ambulatory Visit (INDEPENDENT_AMBULATORY_CARE_PROVIDER_SITE_OTHER): Payer: BLUE CROSS/BLUE SHIELD | Admitting: Urgent Care

## 2015-09-14 ENCOUNTER — Other Ambulatory Visit (HOSPITAL_COMMUNITY): Payer: BLUE CROSS/BLUE SHIELD

## 2015-09-14 ENCOUNTER — Encounter: Payer: Self-pay | Admitting: Urgent Care

## 2015-09-14 VITALS — BP 116/75 | HR 67 | Temp 98.0°F | Resp 16 | Ht 70.5 in | Wt 184.0 lb

## 2015-09-14 DIAGNOSIS — I10 Essential (primary) hypertension: Secondary | ICD-10-CM

## 2015-09-14 DIAGNOSIS — N529 Male erectile dysfunction, unspecified: Secondary | ICD-10-CM | POA: Diagnosis not present

## 2015-09-14 DIAGNOSIS — F329 Major depressive disorder, single episode, unspecified: Secondary | ICD-10-CM | POA: Diagnosis not present

## 2015-09-14 DIAGNOSIS — F32A Depression, unspecified: Secondary | ICD-10-CM

## 2015-09-14 DIAGNOSIS — Z8639 Personal history of other endocrine, nutritional and metabolic disease: Secondary | ICD-10-CM

## 2015-09-14 DIAGNOSIS — J329 Chronic sinusitis, unspecified: Secondary | ICD-10-CM | POA: Diagnosis not present

## 2015-09-14 DIAGNOSIS — K5790 Diverticulosis of intestine, part unspecified, without perforation or abscess without bleeding: Secondary | ICD-10-CM

## 2015-09-14 DIAGNOSIS — R0982 Postnasal drip: Secondary | ICD-10-CM

## 2015-09-14 DIAGNOSIS — K900 Celiac disease: Secondary | ICD-10-CM | POA: Diagnosis not present

## 2015-09-14 DIAGNOSIS — Z0000 Encounter for general adult medical examination without abnormal findings: Secondary | ICD-10-CM

## 2015-09-14 MED ORDER — METOPROLOL SUCCINATE ER 25 MG PO TB24: 12.5000 mg | ORAL_TABLET | Freq: Every day | ORAL | Status: DC

## 2015-09-14 MED ORDER — SILDENAFIL CITRATE 100 MG PO TABS: 50.0000 mg | ORAL_TABLET | Freq: Every day | ORAL | Status: DC | PRN

## 2015-09-14 MED ORDER — CETIRIZINE HCL 10 MG PO TABS: 10.0000 mg | ORAL_TABLET | Freq: Every day | ORAL | Status: DC

## 2015-09-14 NOTE — Progress Notes (Signed)
MRN: [id number]  Subjective:   [name] is a 49 y.o. male presenting for annual physical exam and ED, sinus problem.  Medical care team includes: PCP: [name], MD Specialists:  Psychiatry [facility]) - sees a psychiatrist and therapist for depression and anxiety. Managed with Wellbutrin, Topamax, Klonopin. Patient has follow up in 11/2015. GI - patient has had multiple GI doctors for management of celiac, IBS, diverticulosis. He plans on scheduling follow up with his last GI doctor. He had a colonoscopy at [facility] in 05/2014 which was abnormal.  Cardiology - sees Dr. Stanford Breed for work up of atypical chest pain. Patient was started on Lipitor and Toprol. He had a stress test 07/2015, this was normal. He has follow up in 11/2015. Patient is also scheduled to have an echo completed before then.  ED - Reports several month history of difficulty obtaining and sustaining an erection. States that he has had low testosterone levels before. He was taking testosterone injections ~2 years ago. He stopped following up however because the visits and treatment was getting expensive. He is worried that his psychiatry medications are causing this.   PND - Reports ~3 week history of intermittent postnasal drainage, runny nose, scratchy throat and intermittent hoarseness. ROS as below. Has not tried any medications for this. Denies history of seasonal allergies.  Jason [name] has a current medication list which includes the following prescription(s): atorvastatin, bupropion, clonazepam, metoprolol succinate, and topiramate. He is allergic to hydrocodone-acetaminophen.  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 Also  has past surgical history that includes Umbilical hernia repair (01/2011); Cervical fusion ( 2007); Stomach surgery (march 2012); Back surgery; Appendectomy (2000); laparoscopy (11/01/2011); laparotomy (01/10/2012); Bowel resection (01/10/2012); Small intestine surgery (12/2011); Hernia repair (11/01/2011); Colon surgery; and Spine surgery.  His family history includes COPD in his sister; Cancer in his paternal grandmother; Clotting disorder in his mother; Colon cancer in his maternal grandmother; Dementia in his mother; Diabetes in his father; Esophageal cancer in his maternal grandmother; Heart disease in his father, mother, and paternal grandfather; Hypertension in his sister; Leukemia in his brother; Pancreatic cancer in his maternal grandfather; Stomach cancer in his maternal grandmother; Stroke in his mother; Uterine cancer in his sister. There is no history of Anesthesia problems.  Immunizations: Flu 07/24/2015, last TDAP 01/01/2015  Review of Systems  Constitutional: Negative for fever, chills, weight loss, malaise/fatigue and diaphoresis.  HENT: Negative for congestion, ear discharge, ear pain, hearing loss, nosebleeds, sore throat and tinnitus.   Eyes: Negative for blurred vision, double vision, photophobia, pain, discharge and redness.  Respiratory: Negative for cough, shortness of breath and wheezing.   Cardiovascular: Negative for chest pain, palpitations and leg swelling.  Gastrointestinal: Negative for nausea, vomiting, abdominal pain, diarrhea, constipation and  blood in stool.  Genitourinary: Negative for dysuria, urgency, frequency, hematuria and flank pain.  Musculoskeletal: Negative for myalgias, back pain and joint pain.  Skin: Negative for itching and rash.  Neurological: Negative for dizziness, tingling, seizures, loss of consciousness,  weakness and headaches.  Endo/Heme/Allergies: Negative for polydipsia.  Psychiatric/Behavioral: Negative for depression, suicidal ideas, hallucinations, memory loss and substance abuse. The patient is not nervous/anxious and does not have insomnia.    Objective:   Vitals: BP 116/75 mmHg  Pulse 67  Temp(Src) 98 F (36.7 C) (Oral)  Resp 16  Ht 5' 10.5" (1.791 m)  Wt 184 lb (83.462 kg)  BMI 26.02 kg/m2  SpO2 100%  Physical Exam  Constitutional: He is oriented to person, place, and time. He appears well-developed and well-nourished.  HENT:  TM's intact bilaterally, no effusions or erythema. Left nasal turbinates mildly boggy, nasal passages patent. No sinus tenderness. Oropharynx with post-nasal drainage, mucous membranes moist, dentition in good repair.  Eyes: Conjunctivae and EOM are normal. Pupils are equal, round, and reactive to light. Right eye exhibits no discharge. Left eye exhibits no discharge. No scleral icterus.  Neck: Normal range of motion. Neck supple. No thyromegaly present.  Cardiovascular: Normal rate, regular rhythm and intact distal pulses.  Exam reveals no gallop and no friction rub.   No murmur heard. Pulmonary/Chest: No stridor. No respiratory distress. He has no wheezes. He has no rales.  Abdominal: Soft. Bowel sounds are normal. He exhibits no distension and no mass. There is no tenderness.  Musculoskeletal: Normal range of motion. He exhibits no edema or tenderness.  Lymphadenopathy:    He has no cervical adenopathy.  Neurological: He is alert and oriented to person, place, and time.  Skin: Skin is warm and dry. No rash noted. No erythema. No pallor.  Psychiatric: He has a  normal mood and affect.   Assessment and Plan :   1. Annual physical exam - Labs pending, patient is medically stable. - Discussed healthy lifestyle, diet, exercise, preventative care, vaccinations, and addressed patient's concerns.   2. Essential hypertension - Refill provided for Metoprolol, see #3, 4 regarding potential for adverse effects.  3. Erectile dysfunction, unspecified erectile dysfunction type 4. History of hypogonadism - Patient is unwilling to recheck testosterone at this point. He does want to start a trial of Viagra. I suggested to patient that Metoprolol (even at a low dose) may be a source. He will discuss this with his cardiologist at his next visit.  5. Depression - Counseled on medications risks and potential for adverse effects. I do not believe that his medications are playing a role here. He is to follow up with psychiatrist in 11/2015.  6. Post-nasal drainage - Start Zyrtec to address allergy as possible source. Recheck in 2-3 weeks if no improvement. Consider antibiotic course depending on CBC results.  7. Celiac disease 8. Diverticulosis of intestine without bleeding, unspecified intestinal tract location - Patient is to follow up with his GI specialist for continued management.  Jaynee Eagles, PA-C [facility] [phone] 09/14/2015  2:46 PM

## 2015-09-14 NOTE — Patient Instructions (Signed)
Keeping you healthy  Get these tests  Blood pressure- Have your blood pressure checked once a year by your healthcare provider.  Normal blood pressure is 120/80.  Weight- Have your body mass index (BMI) calculated to screen for obesity.  BMI is a measure of body fat based on height and weight. You can also calculate your own BMI at [employer]velBags.it.  Cholesterol- Have your cholesterol checked regularly starting at age 48, sooner may be necessary if you have diabetes, high blood pressure, if a family member developed heart diseases at an early age or if you smoke.   Chlamydia, HIV, and other sexual transmitted disease- Get screened each year until the age of 33 then within three months of each new sexual partner.  Diabetes- Have your blood sugar checked regularly if you have high blood pressure, high cholesterol, a family history of diabetes or if you are overweight.  Get these vaccines  Flu shot- Every fall.  Tetanus shot- Every 10 years.  Menactra- Single dose; prevents meningitis.  Take these steps  Don't smoke- If you do smoke, ask your healthcare provider about quitting. For tips on how to quit, go to www.smokefree.gov or call 1-800-QUIT-NOW.  Be physically active- Exercise 5 days a week for at least 30 minutes.  If you are not already physically active start slow and gradually work up to 30 minutes of moderate physical activity.  Examples of moderate activity include walking briskly, mowing the yard, dancing, swimming bicycling, etc.  Eat a healthy diet- Eat a variety of healthy foods such as fruits, vegetables, low fat milk, low fat cheese, yogurt, lean meats, poultry, fish, beans, tofu, etc.  For more information on healthy eating, go to www.thenutritionsource.org  Drink alcohol in moderation- Limit alcohol intake two drinks or less a day.  Never drink and drive.  Dentist- Brush and floss teeth twice daily; visit your dentis twice a year.  Depression-Your emotional  health is as important as your physical health.  If you're feeling down, losing interest in things you normally enjoy please talk with your healthcare provider.  Gun Safety- If you keep a gun in your home, keep it unloaded and with the safety lock on.  Bullets should be stored separately.  Helmet use- Always wear a helmet when riding a motorcycle, bicycle, rollerblading or skateboarding.  Safe sex- If you may be exposed to a sexually transmitted infection, use a condom  Seat belts- Seat bels can save your life; always wear one.  Smoke/Carbon Monoxide detectors- These detectors need to be installed on the appropriate level of your home.  Replace batteries at least once a year.  Skin Cancer- When out in the sun, cover up and use sunscreen SPF 15 or higher.  Violence- If anyone is threatening or hurting you, please tell your healthcare provider.    Sildenafil tablets (Viagra) What is this medicine? SILDENAFIL (sil DEN a fil) is used to treat erection problems in men. This medicine may be used for other purposes; ask your health care provider or pharmacist if you have questions. What should I tell my health care provider before I take this medicine? They need to know if you have any of these conditions: -bleeding disorders -eye or vision problems, including a rare inherited eye disease called retinitis pigmentosa -anatomical deformation of the penis, Peyronie's disease, or history of priapism (painful and prolonged erection) -heart disease, angina, a history of heart attack, irregular heart beats, or other heart problems -high or low blood pressure -history of blood diseases,  like sickle cell anemia or leukemia -history of stomach bleeding -kidney disease -liver disease -stroke -an unusual or allergic reaction to sildenafil, other medicines, foods, dyes, or preservatives -pregnant or trying to get pregnant -breast-feeding How should I use this medicine? Take this medicine by mouth  with a glass of water. Follow the directions on the prescription label. The dose is usually taken 1 hour before sexual activity. You should not take the dose more than once per day. Do not take your medicine more often than directed. Talk to your pediatrician regarding the use of this medicine in children. This medicine is not used in children for this condition. Overdosage: If you think you have taken too much of this medicine contact a poison control center or emergency room at once. NOTE: This medicine is only for you. Do not share this medicine with others. What if I miss a dose? This does not apply. Do not take double or extra doses. What may interact with this medicine? Do not take this medicine with any of the following medications: -cisapride -methscopolamine nitrate -nitrates like amyl nitrite, isosorbide dinitrate, isosorbide mononitrate, nitroglycerin -nitroprusside -other medicines for erectile dysfunction like avanafil, tadalafil, vardenafil -riociguat -other sildenafil products (Revatio) This medicine may also interact with the following medications: -certain drugs for high blood pressure -certain drugs for the treatment of HIV infection or AIDS -certain drugs used for fungal or yeast infections, like fluconazole, itraconazole, ketoconazole, and voriconazole -cimetidine -erythromycin -rifampin This list may not describe all possible interactions. Give your health care provider a list of all the medicines, herbs, non-prescription drugs, or dietary supplements you use. Also tell them if you smoke, drink alcohol, or use illegal drugs. Some items may interact with your medicine. What should I watch for while using this medicine? If you notice any changes in your vision while taking this drug, call your doctor or health care professional as soon as possible. Stop using this medicine and call your health care provider right away if you have a loss of sight in one or both  eyes. Contact your doctor or health care professional right away if you have an erection that lasts longer than 4 hours or if it becomes painful. This may be a sign of a serious problem and must be treated right away to prevent permanent damage. If you experience symptoms of nausea, dizziness, chest pain or arm pain upon initiation of sexual activity after taking this medicine, you should refrain from further activity and call your doctor or health care professional as soon as possible. Do not drink alcohol to excess (examples, 5 glasses of wine or 5 shots of whiskey) when taking this medicine. When taken in excess, alcohol can increase your chances of getting a headache or getting dizzy, increasing your heart rate or lowering your blood pressure. Using this medicine does not protect you or your partner against HIV infection (the virus that causes AIDS) or other sexually transmitted diseases. What side effects may I notice from receiving this medicine? Side effects that you should report to your doctor or health care professional as soon as possible: -allergic reactions like skin rash, itching or hives, swelling of the face, lips, or tongue -breathing problems -changes in hearing -changes in vision -chest pain -fast, irregular heartbeat -prolonged or painful erection -seizures Side effects that usually do not require medical attention (report to your doctor or health care professional if they continue or are bothersome): -back pain -dizziness -flushing -headache -indigestion -muscle aches -nausea -stuffy or runny nose  This list may not describe all possible side effects. Call your doctor for medical advice about side effects. You may report side effects to FDA at 1-800-FDA-1088. Where should I keep my medicine? Keep out of reach of children. Store at room temperature between 15 and 30 degrees C (59 and 86 degrees F). Throw away any unused medicine after the expiration date. NOTE: This  sheet is a summary. It may not cover all possible information. If you have questions about this medicine, talk to your doctor, pharmacist, or health care provider.    2016, Elsevier/Gold Standard. (2014-02-04 13:19:04)

## 2015-09-15 LAB — CBC
HCT: 45 % (ref 39.0–52.0)
Hemoglobin: 15.5 g/dL (ref 13.0–17.0)
MCH: 29.7 pg (ref 26.0–34.0)
MCHC: 34.4 g/dL (ref 30.0–36.0)
MCV: 86.2 fL (ref 78.0–100.0)
MPV: 10 fL (ref 8.6–12.4)
PLATELETS: 205 10*3/uL (ref 150–400)
RBC: 5.22 MIL/uL (ref 4.22–5.81)
RDW: 13.1 % (ref 11.5–15.5)
WBC: 6 10*3/uL (ref 4.0–10.5)

## 2015-09-15 LAB — LIPID PANEL
Cholesterol: 133 mg/dL (ref 125–200)
HDL: 49 mg/dL (ref >=40)
LDL CALC: 71 mg/dL (ref <130)
Total CHOL/HDL Ratio: 2.7 Ratio (ref <=5.0)
Triglycerides: 65 mg/dL (ref <150)
VLDL: 13 mg/dL (ref <30)

## 2015-09-15 LAB — TSH: TSH: 1.431 u[IU]/mL (ref 0.350–4.500)

## 2015-09-15 LAB — COMPREHENSIVE METABOLIC PANEL
ALBUMIN: 4.8 g/dL (ref 3.6–5.1)
ALT: 10 U/L (ref 9–46)
AST: 19 U/L (ref 10–40)
Alkaline Phosphatase: 82 U/L (ref 40–115)
BILIRUBIN TOTAL: 0.6 mg/dL (ref 0.2–1.2)
BUN: 14 mg/dL (ref 7–25)
CHLORIDE: 108 mmol/L (ref 98–110)
CO2: 22 mmol/L (ref 20–31)
CREATININE: 1.25 mg/dL (ref 0.60–1.35)
Calcium: 9.4 mg/dL (ref 8.6–10.3)
Glucose, Bld: 77 mg/dL (ref 65–99)
Potassium: 4 mmol/L (ref 3.5–5.3)
SODIUM: 140 mmol/L (ref 135–146)
Total Protein: 7.3 g/dL (ref 6.1–8.1)

## 2015-09-21 ENCOUNTER — Encounter (HOSPITAL_COMMUNITY): Payer: BLUE CROSS/BLUE SHIELD

## 2015-09-25 ENCOUNTER — Encounter: Payer: Self-pay | Admitting: Family Medicine

## 2015-09-27 ENCOUNTER — Encounter: Payer: Self-pay | Admitting: Family Medicine

## 2015-09-28 ENCOUNTER — Encounter: Payer: Self-pay | Admitting: Urgent Care

## 2015-09-28 NOTE — Telephone Encounter (Signed)
[phone]   See patients e-mail.

## 2015-09-29 ENCOUNTER — Other Ambulatory Visit: Payer: Self-pay | Admitting: Urgent Care

## 2015-09-29 LAB — LIPID PANEL
CHOL/HDL RATIO: 2.3 ratio (ref <=5.0)
CHOLESTEROL: 140 mg/dL (ref 125–200)
HDL: 60 mg/dL (ref >=40)
LDL Cholesterol: 66 mg/dL (ref <130)
TRIGLYCERIDES: 71 mg/dL (ref <150)
VLDL: 14 mg/dL (ref <30)

## 2015-09-29 LAB — HEPATIC FUNCTION PANEL
ALT: 19 U/L (ref 9–46)
AST: 30 U/L (ref 10–40)
Albumin: 4.6 g/dL (ref 3.6–5.1)
Alkaline Phosphatase: 84 U/L (ref 40–115)
BILIRUBIN DIRECT: 0.1 mg/dL (ref <=0.2)
BILIRUBIN INDIRECT: 0.4 mg/dL (ref 0.2–1.2)
TOTAL PROTEIN: 7.1 g/dL (ref 6.1–8.1)
Total Bilirubin: 0.5 mg/dL (ref 0.2–1.2)

## 2015-09-29 MED ORDER — AMOXICILLIN 875 MG PO TABS: 875.0000 mg | ORAL_TABLET | Freq: Two times a day (BID) | ORAL | Status: DC

## 2015-10-05 ENCOUNTER — Encounter: Payer: Self-pay | Admitting: *Deleted

## 2015-10-19 ENCOUNTER — Encounter: Payer: Self-pay | Admitting: Cardiology

## 2015-11-21 ENCOUNTER — Other Ambulatory Visit (HOSPITAL_COMMUNITY): Payer: BLUE CROSS/BLUE SHIELD

## 2015-11-22 ENCOUNTER — Inpatient Hospital Stay (HOSPITAL_COMMUNITY): Admission: RE | Admit: 2015-11-22 | Payer: BLUE CROSS/BLUE SHIELD | Source: Ambulatory Visit

## 2015-12-19 ENCOUNTER — Encounter (HOSPITAL_COMMUNITY): Payer: Self-pay | Admitting: Emergency Medicine

## 2015-12-19 ENCOUNTER — Emergency Department (HOSPITAL_COMMUNITY): Payer: BLUE CROSS/BLUE SHIELD

## 2015-12-19 ENCOUNTER — Emergency Department (HOSPITAL_COMMUNITY)
Admission: EM | Admit: 2015-12-19 | Discharge: 2015-12-19 | Disposition: A | Discharge status: Home / Self Care | Payer: BLUE CROSS/BLUE SHIELD | Attending: Emergency Medicine | Admitting: Emergency Medicine

## 2015-12-19 DIAGNOSIS — R109 Unspecified abdominal pain: Secondary | ICD-10-CM | POA: Diagnosis present

## 2015-12-19 DIAGNOSIS — N200 Calculus of kidney: Secondary | ICD-10-CM | POA: Insufficient documentation

## 2015-12-19 DIAGNOSIS — Z8711 Personal history of peptic ulcer disease: Secondary | ICD-10-CM | POA: Insufficient documentation

## 2015-12-19 DIAGNOSIS — M1990 Unspecified osteoarthritis, unspecified site: Secondary | ICD-10-CM | POA: Diagnosis not present

## 2015-12-19 DIAGNOSIS — E785 Hyperlipidemia, unspecified: Secondary | ICD-10-CM | POA: Insufficient documentation

## 2015-12-19 DIAGNOSIS — F329 Major depressive disorder, single episode, unspecified: Secondary | ICD-10-CM | POA: Insufficient documentation

## 2015-12-19 DIAGNOSIS — Z8701 Personal history of pneumonia (recurrent): Secondary | ICD-10-CM | POA: Insufficient documentation

## 2015-12-19 DIAGNOSIS — I10 Essential (primary) hypertension: Secondary | ICD-10-CM | POA: Insufficient documentation

## 2015-12-19 DIAGNOSIS — Z862 Personal history of diseases of the blood and blood-forming organs and certain disorders involving the immune mechanism: Secondary | ICD-10-CM | POA: Insufficient documentation

## 2015-12-19 DIAGNOSIS — Z79899 Other long term (current) drug therapy: Secondary | ICD-10-CM | POA: Insufficient documentation

## 2015-12-19 LAB — COMPREHENSIVE METABOLIC PANEL
ALK PHOS: 77 U/L (ref 38–126)
ALT: 17 U/L (ref 17–63)
ANION GAP: 8 (ref 5–15)
AST: 33 U/L (ref 15–41)
Albumin: 3.8 g/dL (ref 3.5–5.0)
BILIRUBIN TOTAL: 0.2 mg/dL — AB (ref 0.3–1.2)
BUN: 14 mg/dL (ref 6–20)
CALCIUM: 9 mg/dL (ref 8.9–10.3)
CO2: 21 mmol/L — AB (ref 22–32)
Chloride: 113 mmol/L — ABNORMAL HIGH (ref 101–111)
Creatinine, Ser: 1.52 mg/dL — ABNORMAL HIGH (ref 0.61–1.24)
GFR, EST NON AFRICAN AMERICAN: 52 mL/min — AB (ref >60)
Glucose, Bld: 177 mg/dL — ABNORMAL HIGH (ref 65–99)
Potassium: 4 mmol/L (ref 3.5–5.1)
SODIUM: 142 mmol/L (ref 135–145)
TOTAL PROTEIN: 6.7 g/dL (ref 6.5–8.1)

## 2015-12-19 LAB — LIPASE, BLOOD: Lipase: 43 U/L (ref 11–51)

## 2015-12-19 LAB — URINALYSIS, ROUTINE W REFLEX MICROSCOPIC
Bilirubin Urine: NEGATIVE
GLUCOSE, UA: NEGATIVE mg/dL
Ketones, ur: NEGATIVE mg/dL
Leukocytes, UA: NEGATIVE
Nitrite: NEGATIVE
PH: 7.5 (ref 5.0–8.0)
PROTEIN: NEGATIVE mg/dL
Specific Gravity, Urine: 1.027 (ref 1.005–1.030)

## 2015-12-19 LAB — URINE MICROSCOPIC-ADD ON

## 2015-12-19 LAB — CBC
HCT: 39.4 % (ref 39.0–52.0)
HEMOGLOBIN: 13.2 g/dL (ref 13.0–17.0)
MCH: 29.3 pg (ref 26.0–34.0)
MCHC: 33.5 g/dL (ref 30.0–36.0)
MCV: 87.6 fL (ref 78.0–100.0)
Platelets: 173 10*3/uL (ref 150–400)
RBC: 4.5 MIL/uL (ref 4.22–5.81)
RDW: 12.8 % (ref 11.5–15.5)
WBC: 9 10*3/uL (ref 4.0–10.5)

## 2015-12-19 MED ORDER — CIPROFLOXACIN HCL 500 MG PO TABS: 500.0000 mg | ORAL_TABLET | Freq: Two times a day (BID) | ORAL | Status: DC

## 2015-12-19 MED ORDER — IBUPROFEN 600 MG PO TABS: 600.0000 mg | ORAL_TABLET | Freq: Four times a day (QID) | ORAL | Status: DC | PRN

## 2015-12-19 MED ORDER — TAMSULOSIN HCL 0.4 MG PO CAPS: 0.4000 mg | ORAL_CAPSULE | Freq: Every day | ORAL | Status: DC

## 2015-12-19 MED ORDER — HYDROMORPHONE HCL 1 MG/ML IJ SOLN
1.0000 mg | Freq: Once | INTRAMUSCULAR | Status: AC
  Administered 2015-12-19: 1 mg via INTRAVENOUS
  Filled 2015-12-19: qty 1

## 2015-12-19 MED ORDER — OXYCODONE-ACETAMINOPHEN 5-325 MG PO TABS: 1.0000 | ORAL_TABLET | ORAL | Status: DC | PRN

## 2015-12-19 MED ORDER — OXYCODONE-ACETAMINOPHEN 5-325 MG PO TABS
1.0000 | ORAL_TABLET | Freq: Once | ORAL | Status: AC
  Administered 2015-12-19: 1 via ORAL
  Filled 2015-12-19: qty 1

## 2015-12-19 MED ORDER — KETOROLAC TROMETHAMINE 30 MG/ML IJ SOLN
30.0000 mg | Freq: Once | INTRAMUSCULAR | Status: AC
  Administered 2015-12-19: 30 mg via INTRAVENOUS
  Filled 2015-12-19: qty 1

## 2015-12-19 MED ORDER — ONDANSETRON HCL 4 MG/2ML IJ SOLN
4.0000 mg | Freq: Once | INTRAMUSCULAR | Status: AC
  Administered 2015-12-19: 4 mg via INTRAVENOUS
  Filled 2015-12-19: qty 2

## 2015-12-19 MED ORDER — ONDANSETRON 8 MG PO TBDP: 8.0000 mg | ORAL_TABLET | Freq: Three times a day (TID) | ORAL | Status: DC | PRN

## 2015-12-19 NOTE — Discharge Instructions (Signed)
Your CT scan shows a left sided kidney stone. Take ibuprofen for pain. Percocet for severe pain. Take flomax to help you pass the stone. zofran for nausea as needed. cipro for infection prevention. Follow up with urology.    Kidney Stones Kidney stones (urolithiasis) are deposits that form inside your kidneys. The intense pain is caused by the stone moving through the urinary tract. When the stone moves, the ureter goes into spasm around the stone. The stone is usually passed in the urine.  CAUSES   A disorder that makes certain neck glands produce too much parathyroid hormone (primary hyperparathyroidism).  A buildup of uric acid crystals, similar to gout in your joints.  Narrowing (stricture) of the ureter.  A kidney obstruction present at birth (congenital obstruction).  Previous surgery on the kidney or ureters.  Numerous kidney infections. SYMPTOMS   Feeling sick to your stomach (nauseous).  Throwing up (vomiting).  Blood in the urine (hematuria).  Pain that usually spreads (radiates) to the groin.  Frequency or urgency of urination. DIAGNOSIS   Taking a history and physical exam.  Blood or urine tests.  CT scan.  Occasionally, an examination of the inside of the urinary bladder (cystoscopy) is performed. TREATMENT   Observation.  Increasing your fluid intake.  Extracorporeal shock wave lithotripsy--This is a noninvasive procedure that uses shock waves to break up kidney stones.  Surgery may be needed if you have severe pain or persistent obstruction. There are various surgical procedures. Most of the procedures are performed with the use of small instruments. Only small incisions are needed to accommodate these instruments, so recovery time is minimized. The size, location, and chemical composition are all important variables that will determine the proper choice of action for you. Talk to your health care provider to better understand your situation so that you  will minimize the risk of injury to yourself and your kidney.  HOME CARE INSTRUCTIONS   Drink enough water and fluids to keep your urine clear or pale yellow. This will help you to pass the stone or stone fragments.  Strain all urine through the provided strainer. Keep all particulate matter and stones for your health care provider to see. The stone causing the pain may be as small as a grain of salt. It is very important to use the strainer each and every time you pass your urine. The collection of your stone will allow your health care provider to analyze it and verify that a stone has actually passed. The stone analysis will often identify what you can do to reduce the incidence of recurrences.  Only take over-the-counter or prescription medicines for pain, discomfort, or fever as directed by your health care provider.  Keep all follow-up visits as told by your health care provider. This is important.  Get follow-up X-rays if required. The absence of pain does not always mean that the stone has passed. It may have only stopped moving. If the urine remains completely obstructed, it can cause loss of kidney function or even complete destruction of the kidney. It is your responsibility to make sure X-rays and follow-ups are completed. Ultrasounds of the kidney can show blockages and the status of the kidney. Ultrasounds are not associated with any radiation and can be performed easily in a matter of minutes.  Make changes to your daily diet as told by your health care provider. You may be told to:  Limit the amount of salt that you eat.  Eat 5 or more servings  of fruits and vegetables each day.  Limit the amount of meat, poultry, fish, and eggs that you eat.  Collect a 24-hour urine sample as told by your health care provider.You may need to collect another urine sample every 6-12 months. SEEK MEDICAL CARE IF:  You experience pain that is progressive and unresponsive to any pain medicine you  have been prescribed. SEEK IMMEDIATE MEDICAL CARE IF:   Pain cannot be controlled with the prescribed medicine.  You have a fever or shaking chills.  The severity or intensity of pain increases over 18 hours and is not relieved by pain medicine.  You develop a new onset of abdominal pain.  You feel faint or pass out.  You are unable to urinate.   This information is not intended to replace advice given to you by your health care provider. Make sure you discuss any questions you have with your health care provider.   Document Released: 09/16/2005 Document Revised: 06/07/2015 Document Reviewed: 02/17/2013 Elsevier Interactive Patient Education [employer].

## 2015-12-19 NOTE — ED Provider Notes (Signed)
CSN: [id number]     Arrival date & time 12/19/15  0945 History   First MD Initiated Contact with Patient 12/19/15 1118     Chief Complaint  Patient presents with  . Flank Pain     (Consider location/radiation/quality/duration/timing/severity/associated sxs/prior Treatment) HPI Jason [name] is a 50 y.o. male with history of esophageal reflux, diverticulosis, irritable bowel syndrome, anxiety, peptic ulcer, ankylosing spondylitis, presents to emergency department complaining of sudden onset of left flank pain. Patient states he developed some dysuria yesterday. This morning he woke up with severe sharp pain in the left flank that radiates into his penis and his scrotum. He continues to have dysuria and difficulty emptying his bladder. He denies any fever or chills. He reports some nausea, no vomiting. He denies any back injuries. No pain radiating down his extremities. He denies any tenderness in his scrotum. He denies history of similar symptoms in the past. He took a Percocet around 6 AM this morning which did not help. He states movement makes pain worse, nothing makes it better.  Past Medical History  Diagnosis Date  . Esophageal reflux   . Irritable bowel syndrome   . Diverticulosis of colon (without mention of hemorrhage)   . Unspecified gastritis and gastroduodenitis without mention of hemorrhage   . Other and unspecified hyperlipidemia   . Anxiety state, unspecified   . IBS (irritable bowel syndrome)   . Peptic ulcer   . Hernia   . Blood transfusion march 2012  . Hiatal hernia   . PONV (postoperative nausea and vomiting)     bowel blockage after may 2012 surgery  . Family history of malignant neoplasm of gastrointestinal tract   . Recurrent upper respiratory infection (URI)     bronchitis- 10/2011- treated /w antbx- by PCP  . Pneumonia     [facility]  . Anemia     anemic- 11/2010, post bleeding ulcer - treated ./w   bld. transfusion   . Arthritis     back, neck , shoulder    . Spondylitis, ankylosing ([place])     lower back issues related to AS  . Unspecified essential hypertension     Dr. Stanford Breed- consulted /w pt.  (2008)due to ^ heartrate & SOB /w climbing stairs, had echo /stress- wnl. Currently PCP manages BP    . Blood transfusion without reported diagnosis   . IBS (irritable bowel syndrome)   . Diverticulitis   . Allergy   . Depression    Past Surgical History  Procedure Laterality Date  . Umbilical hernia repair  01/2011    1998,  01/2011 & 11/01/2011  . Cervical fusion   2007    c 4   . Stomach surgery  march 2012    bleeding ulcer  . Back surgery      X3, lower  back  . Appendectomy  2000  . Laparoscopy  11/01/2011    Procedure: LAPAROSCOPY DIAGNOSTIC;  Surgeon: Harl Bowie, MD;  Location: [facility];  Service: General;  Laterality: N/A;  . Laparotomy  01/10/2012    Procedure: EXPLORATORY LAPAROTOMY;  Surgeon: Harl Bowie, MD;  Location: [place];  Service: General;  Laterality: N/A;  . Bowel resection  01/10/2012    Procedure: SMALL BOWEL RESECTION;  Surgeon: Harl Bowie, MD;  Location: [place];  Service: General;  Laterality: N/A;  . Small intestine surgery  12/2011  . Hernia repair  11/01/2011    incisional hernia  . Colon surgery    .  Spine surgery     Family History  Problem Relation Age of Onset  . Colon cancer Maternal Grandmother   . Stomach cancer Maternal Grandmother     METS  . Esophageal cancer Maternal Grandmother   . Uterine cancer Sister   . COPD Sister   . Hypertension Sister   . Heart disease Mother   . Clotting disorder Mother   . Stroke Mother     two strokes  . Dementia Mother   . Diabetes Father   . Heart disease Father   . Pancreatic cancer Maternal Grandfather   . Leukemia Brother   . Anesthesia problems Neg Hx   . Cancer Paternal Grandmother   . Heart disease Paternal Grandfather     heart attack   Social History  Substance Use Topics  . Smoking status: Never Smoker   . Smokeless tobacco: Never  Used  . Alcohol Use: 0.0 oz/week    0 Standard drinks or equivalent per week     Comment: rarely, 1 or 2 a week    Review of Systems  Constitutional: Negative for fever and chills.  Respiratory: Negative for cough, chest tightness and shortness of breath.   Cardiovascular: Negative for chest pain, palpitations and leg swelling.  Gastrointestinal: Positive for nausea and abdominal pain. Negative for vomiting, diarrhea and abdominal distention.  Genitourinary: Positive for dysuria, urgency, flank pain and difficulty urinating. Negative for frequency, hematuria, discharge, penile swelling, scrotal swelling and testicular pain.  Musculoskeletal: Negative for myalgias, arthralgias, neck pain and neck stiffness.  Skin: Negative for rash.  Neurological: Negative for dizziness, weakness, light-headedness, numbness and headaches.  All other systems reviewed and are negative.     Allergies  Hydrocodone-acetaminophen  Home Medications   Prior to Admission medications   Medication Sig Start Date End Date Taking? Authorizing Provider  amoxicillin (AMOXIL) 875 MG tablet Take 1 tablet (875 mg total) by mouth 2 (two) times daily. 09/29/15   Jaynee Eagles, PA-C  atorvastatin (LIPITOR) 20 MG tablet Take 1 tablet (20 mg total) by mouth daily. 08/28/15   Lelon Perla, MD  buPROPion (WELLBUTRIN XL) 300 MG 24 hr tablet Take 300 mg by mouth daily.    Historical Provider, MD  cetirizine (ZYRTEC) 10 MG tablet Take 1 tablet (10 mg total) by mouth daily. 09/14/15   Jaynee Eagles, PA-C  clonazePAM (KLONOPIN) 1 MG tablet TAKE 1 TO 2 TABLETS BY MOUTH TWICE A DAY AS NEEDED FOR ANXIETY 10/23/14   [name], MD  metoprolol succinate (TOPROL-XL) 25 MG 24 hr tablet Take 0.5 tablets (12.5 mg total) by mouth daily. 09/14/15   Jaynee Eagles, PA-C  sildenafil (VIAGRA) 100 MG tablet Take 0.5-1 tablets (50-100 mg total) by mouth daily as needed for erectile dysfunction. 09/14/15   Jaynee Eagles, PA-C  topiramate (TOPAMAX)  100 MG tablet Take 100 mg by mouth 2 (two) times daily.    Historical Provider, MD   BP 143/79 mmHg  Pulse 69  Temp(Src) 97.6 F (36.4 C) (Oral)  Resp 20  SpO2 100% Physical Exam  Constitutional: He appears well-developed and well-nourished. No distress.  HENT:  Head: Normocephalic and atraumatic.  Eyes: Conjunctivae are normal.  Neck: Neck supple.  Cardiovascular: Normal rate, regular rhythm and normal heart sounds.   Pulmonary/Chest: Effort normal. No respiratory distress. He has no wheezes. He has no rales.  Abdominal: Soft. Bowel sounds are normal. He exhibits no distension. There is tenderness. There is no rebound.  Left lower quadrant tenderness. Left CVA tenderness  Musculoskeletal: He exhibits no edema.  Neurological: He is alert.  Skin: Skin is warm and dry.  Nursing note and vitals reviewed.   ED Course  Procedures (including critical care time) Labs Review Labs Reviewed  COMPREHENSIVE METABOLIC PANEL - Abnormal; Notable for the following:    Chloride 113 (*)    CO2 21 (*)    Glucose, Bld 177 (*)    Creatinine, Ser 1.52 (*)    Total Bilirubin 0.2 (*)    GFR calc non Af Amer 52 (*)    All other components within normal limits  LIPASE, BLOOD  CBC  URINALYSIS, ROUTINE W REFLEX MICROSCOPIC (NOT AT [facility])    Imaging Review Ct Renal Stone Study  12/19/2015  CLINICAL DATA:  Left flank pain with nausea for 1 day.  Dysuria. EXAM: CT ABDOMEN AND PELVIS WITHOUT CONTRAST TECHNIQUE: Multidetector CT imaging of the abdomen and pelvis was performed following the standard protocol without oral or intravenous contrast material administration. COMPARISON:  April 11, 2014 FINDINGS: Lower chest: There is slight posterior basilar lung atelectasis. Lung bases otherwise appear clear. Hepatobiliary: No focal liver lesions are identified on this noncontrast enhanced study. Gallbladder wall is not appreciably thickened. There is no biliary duct dilatation. Pancreas: No pancreatic mass or  inflammatory focus. Spleen: No splenic lesions are evident. Adrenals/Urinary Tract: No adrenal lesions are identified. There is no demonstrable renal mass on either side. There is mild left renal edema. There is mild hydronephrosis on the left. There is no hydronephrosis on the right. On the right, there is a 1 mm nonobstructing calculus in a mid kidney. There is no intrarenal calculus on the left. On the right, there is no ureteral calculus. On the left, there is a calculus measuring 1 mm in the distal ureter near the ureterovesical junction with mild surrounding distal left ureteral edema. Urinary bladder is midline with wall thickness within normal limits. Stomach/Bowel: There is no bowel wall or mesenteric thickening. No bowel obstruction. No free air or portal venous air. There is postoperative change in the mid to distal transverse colon region with anastomosis patent. Vascular/Lymphatic: There is no abdominal aortic aneurysm. No vascular lesion is evident on this noncontrast enhanced study. There is no adenopathy in the abdomen or pelvis. Reproductive: There are small prostatic calculi. Prostate and seminal vesicles appear normal in size and contour. There is no pelvic mass or pelvic fluid collection. Other: Appendix is absent. There is no abscess or ascites in the abdomen or pelvis. There is a small ventral hernia containing only fat. Mild scarring in this area is consistent with previous surgery. Musculoskeletal: There is postoperative change in the mid to lower lumbar spine. There are no blastic or lytic bone lesions. There is degenerative change in the mid to lower lumbar spine region as well as in the sacroiliac joints bilaterally. There is no intramuscular or abdominal wall lesion. IMPRESSION: 1 mm calculus distal left ureter causing mild hydronephrosis and mild left-sided renal/perirenal edema. No hydronephrosis the right. There is a tiny calculus in the periphery of the mid right kidney. No bowel  obstruction.  No abscess.  Appendix is absent. Small ventral hernia containing only fat. Extensive postoperative change in the lower lumbar spine with osteoarthritic change but no spinal stenosis evident. Several small prostatic calculi noted. Electronically Signed   By: Lowella Grip III M.D.   On: 12/19/2015 12:47   I have personally reviewed and evaluated these images and lab results as part of my medical decision-making.   EKG  Interpretation None      MDM   Final diagnoses:  Renal calculus, left   11:41 AM Pt seen and examined. Pt with left flank pain, dysuria. No hx of the same. Labs and UA pending. Will get CT abd/pelvis to ro kidney stone. Pain medications ordered. VS normal. Abdomen soft.   2:24 PM CT showing 1 mm calculus in the distal left ureter with mild hydronephrosis and mild left-sided renal and perirenal edema. Extensive postoperative changes in the back which patient is aware of. Patient's pain is now much better, he is tolerating by mouth fluids. He was treated with Toradol, Dilaudid, oral Percocet. Patient's urinalysis shows many bacteria, however it is leukocyte and nitrate negative. No blood cells. Since cultures. Will start on Cipro for possible early infection. Patient's creatinine is slightly elevated as well. Encouraged to drink plenty of fluids. Will need close recheck in one week. I will follow patient with urology. Return precautions discussed  Filed Vitals:   12/19/15 1315 12/19/15 1400 12/19/15 1430 12/19/15 1445  BP: 124/70 108/63 118/59 116/66  Pulse: 83 80 77 85  Temp:      TempSrc:      Resp:      SpO2: 96% 97% 94% 95%     Jeannett Senior, PA-C 12/19/15 1627  Charlesetta Shanks, MD 12/20/15 1956

## 2015-12-19 NOTE — ED Notes (Cosign Needed Addendum)
Pt reports dysuria starting yesterday with left flank pain starting this morning. NAD at this time. Pt alert x4. Pt reports taking a percocet at 600 this morning with no relief. Pt also reports dry heaves all morning.

## 2015-12-20 LAB — GC/CHLAMYDIA PROBE AMP (~~LOC~~) NOT AT ARMC
Chlamydia: NEGATIVE
NEISSERIA GONORRHEA: NEGATIVE

## 2015-12-21 LAB — URINE CULTURE

## 2016-04-30 ENCOUNTER — Encounter: Payer: Self-pay | Admitting: Urgent Care

## 2016-05-13 ENCOUNTER — Encounter: Payer: Self-pay | Admitting: Family Medicine

## 2016-05-29 NOTE — Telephone Encounter (Signed)
Jason [name], it looks like [facility] sent this, but not sure if you ever saw it? Pt also sent the message to Dr Carlota Raspberry, but he has not responded, and you were the last to examine pt. I checked pt's formulary and it looks like the Viagra is the preferred med at tier 2, but has quantity limit. Pharm should have seen that though and only run it for the allowed amount. Do you want to Rx the generic sildenafil 20? Usually a price break at #50 when paying cash. [place] had the best price I have seen for under $40 (at one time at least).

## 2016-06-29 ENCOUNTER — Other Ambulatory Visit: Payer: Self-pay | Admitting: Family Medicine

## 2016-06-29 DIAGNOSIS — I10 Essential (primary) hypertension: Secondary | ICD-10-CM

## 2016-07-01 ENCOUNTER — Other Ambulatory Visit: Payer: Self-pay | Admitting: Orthopaedic Surgery

## 2016-07-01 DIAGNOSIS — Q761 Klippel-Feil syndrome: Secondary | ICD-10-CM

## 2016-07-01 DIAGNOSIS — Z981 Arthrodesis status: Secondary | ICD-10-CM

## 2016-07-01 NOTE — Telephone Encounter (Signed)
08/2015 last ov 12/19/15 last lab

## 2016-07-02 NOTE — Telephone Encounter (Signed)
Refilled, but make sure he has scheduled follow up visit with me in the next month or 2. I have not personally seen him since October of last year, although his physical with Jaynee Eagles was in December.

## 2016-07-03 NOTE — Telephone Encounter (Signed)
Patient advised, he has a cpe with Dr Carlota Raspberry 07/11/16

## 2016-07-07 ENCOUNTER — Ambulatory Visit
Admission: RE | Admit: 2016-07-07 | Discharge: 2016-07-07 | Disposition: A | Discharge status: Home / Self Care | Payer: BLUE CROSS/BLUE SHIELD | Source: Ambulatory Visit | Attending: Orthopaedic Surgery | Admitting: Orthopaedic Surgery

## 2016-07-07 DIAGNOSIS — Q761 Klippel-Feil syndrome: Secondary | ICD-10-CM

## 2016-07-07 DIAGNOSIS — Z981 Arthrodesis status: Secondary | ICD-10-CM

## 2016-07-11 ENCOUNTER — Encounter: Payer: Self-pay | Admitting: Family Medicine

## 2016-07-18 ENCOUNTER — Ambulatory Visit (INDEPENDENT_AMBULATORY_CARE_PROVIDER_SITE_OTHER): Payer: BLUE CROSS/BLUE SHIELD | Admitting: Family Medicine

## 2016-07-18 ENCOUNTER — Encounter: Payer: Self-pay | Admitting: Family Medicine

## 2016-07-18 VITALS — BP 122/82 | HR 79 | Temp 98.0°F | Resp 16 | Ht 70.5 in | Wt 187.4 lb

## 2016-07-18 DIAGNOSIS — N529 Male erectile dysfunction, unspecified: Secondary | ICD-10-CM | POA: Diagnosis not present

## 2016-07-18 DIAGNOSIS — R300 Dysuria: Secondary | ICD-10-CM

## 2016-07-18 DIAGNOSIS — Z113 Encounter for screening for infections with a predominantly sexual mode of transmission: Secondary | ICD-10-CM

## 2016-07-18 DIAGNOSIS — I10 Essential (primary) hypertension: Secondary | ICD-10-CM | POA: Diagnosis not present

## 2016-07-18 DIAGNOSIS — E349 Endocrine disorder, unspecified: Secondary | ICD-10-CM

## 2016-07-18 DIAGNOSIS — Z23 Encounter for immunization: Secondary | ICD-10-CM | POA: Diagnosis not present

## 2016-07-18 DIAGNOSIS — E7800 Pure hypercholesterolemia, unspecified: Secondary | ICD-10-CM

## 2016-07-18 DIAGNOSIS — R739 Hyperglycemia, unspecified: Secondary | ICD-10-CM

## 2016-07-18 DIAGNOSIS — Z0000 Encounter for general adult medical examination without abnormal findings: Secondary | ICD-10-CM | POA: Diagnosis not present

## 2016-07-18 DIAGNOSIS — R7989 Other specified abnormal findings of blood chemistry: Secondary | ICD-10-CM

## 2016-07-18 LAB — POCT URINALYSIS DIP (MANUAL ENTRY)
BILIRUBIN UA: NEGATIVE
Glucose, UA: NEGATIVE
Ketones, POC UA: NEGATIVE
Leukocytes, UA: NEGATIVE
NITRITE UA: NEGATIVE
PH UA: 6
PROTEIN UA: NEGATIVE
RBC UA: NEGATIVE
Spec Grav, UA: 1.025
UROBILINOGEN UA: 0.2

## 2016-07-18 MED ORDER — METOPROLOL SUCCINATE ER 25 MG PO TB24: 12.5000 mg | ORAL_TABLET | Freq: Every day | ORAL | 0 refills | Status: AC

## 2016-07-18 MED ORDER — ATORVASTATIN CALCIUM 20 MG PO TABS: 20.0000 mg | ORAL_TABLET | Freq: Every day | ORAL | 1 refills | Status: DC

## 2016-07-18 NOTE — Progress Notes (Signed)
   Subjective:    Patient ID: Jason [name], male    DOB: [birth year], 50 y.o.   MRN: [id number]  HPI    Review of Systems  Constitutional: Positive for activity change.  HENT: Negative.   Eyes: Negative.   Respiratory: Negative.   Cardiovascular: Negative.   Gastrointestinal: Negative.   Endocrine: Negative.   Genitourinary: Positive for dysuria.  Musculoskeletal: Positive for back pain and neck pain.  Skin: Negative.   Allergic/Immunologic: Negative.   Neurological: Negative.   Hematological: Negative.   Psychiatric/Behavioral: Positive for sleep disturbance. The patient is nervous/anxious.        Objective:   Physical Exam        Assessment & Plan:

## 2016-07-18 NOTE — Patient Instructions (Addendum)
I will check a testosterone level today, but if that is low, it will need to be repeated between 8 and 10 in the morning at least once. If it is still significantly low as you have the past, you may need further workup with an endocrinologist to look at other causes of that low testosterone.  No change in your other medications for now. I will check a prostate test, and other urine tests to look at infection, but based on the episodic symptoms, less likely acute infection.  Follow-up with me in 6 months, sooner if new or worsening symptoms. Keeping you healthy  Get these tests  Blood pressure- Have your blood pressure checked once a year by your healthcare provider.  Normal blood pressure is 120/80.  Weight- Have your body mass index (BMI) calculated to screen for obesity.  BMI is a measure of body fat based on height and weight. You can also calculate your own BMI at [email or web address].  Cholesterol- Have your cholesterol checked regularly starting at age 23, sooner may be necessary if you have diabetes, high blood pressure, if a family member developed heart diseases at an early age or if you smoke.   Chlamydia, HIV, and other sexual transmitted disease- Get screened each year until the age of 37 then within three months of each new sexual partner.  Diabetes- Have your blood sugar checked regularly if you have high blood pressure, high cholesterol, a family history of diabetes or if you are overweight.  Get these vaccines  Flu shot- Every fall.  Tetanus shot- Every 10 years.  Menactra- Single dose; prevents meningitis.  Take these steps  Don't smoke- If you do smoke, ask your healthcare provider about quitting. For tips on how to quit, go to www.smokefree.gov or call 1-800-QUIT-NOW.  Be physically active- Exercise 5 days a week for at least 30 minutes.  If you are not already physically active start slow and gradually work up to 30 minutes of moderate physical activity.   Examples of moderate activity include walking briskly, mowing the yard, dancing, swimming bicycling, etc.  Eat a healthy diet- Eat a variety of healthy foods such as fruits, vegetables, low fat milk, low fat cheese, yogurt, lean meats, poultry, fish, beans, tofu, etc.  For more information on healthy eating, go to www.thenutritionsource.org  Drink alcohol in moderation- Limit alcohol intake two drinks or less a day.  Never drink and drive.  Dentist- Brush and floss teeth twice daily; visit your dentis twice a year.  Depression-Your emotional health is as important as your physical health.  If you're feeling down, losing interest in things you normally enjoy please talk with your healthcare provider.  Gun Safety- If you keep a gun in your home, keep it unloaded and with the safety lock on.  Bullets should be stored separately.  Helmet use- Always wear a helmet when riding a motorcycle, bicycle, rollerblading or skateboarding.  Safe sex- If you may be exposed to a sexually transmitted infection, use a condom  Seat belts- Seat bels can save your life; always wear one.  Smoke/Carbon Monoxide detectors- These detectors need to be installed on the appropriate level of your home.  Replace batteries at least once a year.  Skin Cancer- When out in the sun, cover up and use sunscreen SPF 15 or higher.  Violence- If anyone is threatening or hurting you, please tell your healthcare provider.   IF you received an x-ray today, you will receive an invoice from [facility]. Please contact  [facility] at [phone] with questions or concerns regarding your invoice.   IF you received labwork today, you will receive an invoice from [employer]. Please contact [facility] at [phone] with questions or concerns regarding your invoice.   Our billing staff will not be able to assist you with questions regarding bills from these companies.  You will be contacted  with the lab results as soon as they are available. The fastest way to get your results is to activate your My Chart account. Instructions are located on the last page of this paperwork. If you have not heard from [place] regarding the results in 2 weeks, please contact this office.    Influenza (Flu) Vaccine (Inactivated or Recombinant):  1. Why get vaccinated? Influenza ("flu") is a contagious disease that spreads around the [place] every year, usually between October and May. Flu is caused by influenza viruses, and is spread mainly by coughing, sneezing, and close contact. Anyone can get flu. Flu strikes suddenly and can last several days. Symptoms vary by age, but can include:  fever/chills  sore throat  muscle aches  fatigue  cough  headache  runny or stuffy nose Flu can also lead to pneumonia and blood infections, and cause diarrhea and seizures in children. If you have a medical condition, such as heart or lung disease, flu can make it worse. Flu is more dangerous for some people. Infants and young children, people 21 years of age and older, pregnant women, and people with certain health conditions or a weakened immune system are at greatest risk. Each year thousands of people in the [place] die from flu, and many more are hospitalized. Flu vaccine can:  keep you from getting flu,  make flu less severe if you do get it, and  keep you from spreading flu to your family and other people. 2. Inactivated and recombinant flu vaccines A dose of flu vaccine is recommended every flu season. Children 6 months through 35 years of age may need two doses during the same flu season. Everyone else needs only one dose each flu season. Some inactivated flu vaccines contain a very small amount of a mercury-based preservative called thimerosal. Studies have not shown thimerosal in vaccines to be harmful, but flu vaccines that do not contain thimerosal are available. There is no live flu  virus in flu shots. They cannot cause the flu. There are many flu viruses, and they are always changing. Each year a new flu vaccine is made to protect against three or four viruses that are likely to cause disease in the upcoming flu season. But even when the vaccine doesn't exactly match these viruses, it may still provide some protection. Flu vaccine cannot prevent:  flu that is caused by a virus not covered by the vaccine, or  illnesses that look like flu but are not. It takes about 2 weeks for protection to develop after vaccination, and protection lasts through the flu season. 3. Some people should not get this vaccine Tell the person who is giving you the vaccine:  If you have any severe, life-threatening allergies. If you ever had a life-threatening allergic reaction after a dose of flu vaccine, or have a severe allergy to any part of this vaccine, you may be advised not to get vaccinated. Most, but not all, types of flu vaccine contain a small amount of egg protein.  If you ever had Guillain-Barre Syndrome (also called GBS). Some people with a history of GBS should  not get this vaccine. This should be discussed with your doctor.  If you are not feeling well. It is usually okay to get flu vaccine when you have a mild illness, but you might be asked to come back when you feel better. 4. Risks of a vaccine reaction With any medicine, including vaccines, there is a chance of reactions. These are usually mild and go away on their own, but serious reactions are also possible. Most people who get a flu shot do not have any problems with it. Minor problems following a flu shot include:  soreness, redness, or swelling where the shot was given  hoarseness  sore, red or itchy eyes  cough  fever  aches  headache  itching  fatigue If these problems occur, they usually begin soon after the shot and last 1 or 2 days. More serious problems following a flu shot can include the  following:  There may be a small increased risk of Guillain-Barre Syndrome (GBS) after inactivated flu vaccine. This risk has been estimated at 1 or 2 additional cases per million people vaccinated. This is much lower than the risk of severe complications from flu, which can be prevented by flu vaccine.  Young children who get the flu shot along with pneumococcal vaccine (PCV13) and/or DTaP vaccine at the same time might be slightly more likely to have a seizure caused by fever. Ask your doctor for more information. Tell your doctor if a child who is getting flu vaccine has ever had a seizure. Problems that could happen after any injected vaccine:  People sometimes faint after a medical procedure, including vaccination. Sitting or lying down for about 15 minutes can help prevent fainting, and injuries caused by a fall. Tell your doctor if you feel dizzy, or have vision changes or ringing in the ears.  Some people get severe pain in the shoulder and have difficulty moving the arm where a shot was given. This happens very rarely.  Any medication can cause a severe allergic reaction. Such reactions from a vaccine are very rare, estimated at about 1 in a million doses, and would happen within a few minutes to a few hours after the vaccination. As with any medicine, there is a very remote chance of a vaccine causing a serious injury or death. The safety of vaccines is always being monitored. For more information, visit: http://www.aguilar.org/ 5. What if there is a serious reaction? What should I look for?  Look for anything that concerns you, such as signs of a severe allergic reaction, very high fever, or unusual behavior. Signs of a severe allergic reaction can include hives, swelling of the face and throat, difficulty breathing, a fast heartbeat, dizziness, and weakness. These would start a few minutes to a few hours after the vaccination. What should I do?  If you think it is a severe  allergic reaction or other emergency that can't wait, call 9-1-1 and get the person to the nearest hospital. Otherwise, call your doctor.  Reactions should be reported to the Vaccine Adverse Event Reporting System (VAERS). Your doctor should file this report, or you can do it yourself through the VAERS web site at www.vaers.SamedayNews.es, or by calling 219-837-3880. VAERS does not give medical advice. 6. The National Vaccine Injury Compensation Program The [employer] Vaccine Injury Compensation Program (VICP) is a federal program that was created to compensate people who may have been injured by certain vaccines. Persons who believe they may have been injured by a vaccine can learn  about the program and about filing a claim by calling [phone] or visiting the [place] website at GoldCloset.com.ee. There is a time limit to file a claim for compensation. 7. How can I learn more?  Ask your healthcare provider. He or she can give you the vaccine package insert or suggest other sources of information.  Call your local or state health department.  Contact the Centers for Disease Control and Prevention (CDC):  Call (614) 781-1319 (1-800-CDC-INFO) or  Visit CDC's website at https://gibson.com/ Vaccine Information Statement Inactivated Influenza Vaccine (05/06/2014)   This information is not intended to replace advice given to you by your health care provider. Make sure you discuss any questions you have with your health care provider.   Document Released: 07/11/2006 Document Revised: 10/07/2014 Document Reviewed: 05/09/2014 Elsevier Interactive Patient Education 2016 Else[employer]. Influenza (Flu) Vaccine (Inactivated or Recombinant):  1. Why get vaccinated? Influenza ("flu") is a contagious disease that spreads around the [place] every year, usually between October and May. Flu is caused by influenza viruses, and is spread mainly by coughing, sneezing, and close contact. Anyone can  get flu. Flu strikes suddenly and can last several days. Symptoms vary by age, but can include:  fever/chills  sore throat  muscle aches  fatigue  cough  headache  runny or stuffy nose Flu can also lead to pneumonia and blood infections, and cause diarrhea and seizures in children. If you have a medical condition, such as heart or lung disease, flu can make it worse. Flu is more dangerous for some people. Infants and young children, people 45 years of age and older, pregnant women, and people with certain health conditions or a weakened immune system are at greatest risk. Each year thousands of people in the [place] die from flu, and many more are hospitalized. Flu vaccine can:  keep you from getting flu,  make flu less severe if you do get it, and  keep you from spreading flu to your family and other people. 2. Inactivated and recombinant flu vaccines A dose of flu vaccine is recommended every flu season. Children 6 months through 74 years of age may need two doses during the same flu season. Everyone else needs only one dose each flu season. Some inactivated flu vaccines contain a very small amount of a mercury-based preservative called thimerosal. Studies have not shown thimerosal in vaccines to be harmful, but flu vaccines that do not contain thimerosal are available. There is no live flu virus in flu shots. They cannot cause the flu. There are many flu viruses, and they are always changing. Each year a new flu vaccine is made to protect against three or four viruses that are likely to cause disease in the upcoming flu season. But even when the vaccine doesn't exactly match these viruses, it may still provide some protection. Flu vaccine cannot prevent:  flu that is caused by a virus not covered by the vaccine, or  illnesses that look like flu but are not. It takes about 2 weeks for protection to develop after vaccination, and protection lasts through the flu season. 3.  Some people should not get this vaccine Tell the person who is giving you the vaccine:  If you have any severe, life-threatening allergies. If you ever had a life-threatening allergic reaction after a dose of flu vaccine, or have a severe allergy to any part of this vaccine, you may be advised not to get vaccinated. Most, but not all, types of flu vaccine contain a  small amount of egg protein.  If you ever had Guillain-Barre Syndrome (also called GBS). Some people with a history of GBS should not get this vaccine. This should be discussed with your doctor.  If you are not feeling well. It is usually okay to get flu vaccine when you have a mild illness, but you might be asked to come back when you feel better. 4. Risks of a vaccine reaction With any medicine, including vaccines, there is a chance of reactions. These are usually mild and go away on their own, but serious reactions are also possible. Most people who get a flu shot do not have any problems with it. Minor problems following a flu shot include:  soreness, redness, or swelling where the shot was given  hoarseness  sore, red or itchy eyes  cough  fever  aches  headache  itching  fatigue If these problems occur, they usually begin soon after the shot and last 1 or 2 days. More serious problems following a flu shot can include the following:  There may be a small increased risk of Guillain-Barre Syndrome (GBS) after inactivated flu vaccine. This risk has been estimated at 1 or 2 additional cases per million people vaccinated. This is much lower than the risk of severe complications from flu, which can be prevented by flu vaccine.  Young children who get the flu shot along with pneumococcal vaccine (PCV13) and/or DTaP vaccine at the same time might be slightly more likely to have a seizure caused by fever. Ask your doctor for more information. Tell your doctor if a child who is getting flu vaccine has ever had a  seizure. Problems that could happen after any injected vaccine:  People sometimes faint after a medical procedure, including vaccination. Sitting or lying down for about 15 minutes can help prevent fainting, and injuries caused by a fall. Tell your doctor if you feel dizzy, or have vision changes or ringing in the ears.  Some people get severe pain in the shoulder and have difficulty moving the arm where a shot was given. This happens very rarely.  Any medication can cause a severe allergic reaction. Such reactions from a vaccine are very rare, estimated at about 1 in a million doses, and would happen within a few minutes to a few hours after the vaccination. As with any medicine, there is a very remote chance of a vaccine causing a serious injury or death. The safety of vaccines is always being monitored. For more information, visit: http://www.aguilar.org/ 5. What if there is a serious reaction? What should I look for?  Look for anything that concerns you, such as signs of a severe allergic reaction, very high fever, or unusual behavior. Signs of a severe allergic reaction can include hives, swelling of the face and throat, difficulty breathing, a fast heartbeat, dizziness, and weakness. These would start a few minutes to a few hours after the vaccination. What should I do?  If you think it is a severe allergic reaction or other emergency that can't wait, call 9-1-1 and get the person to the nearest hospital. Otherwise, call your doctor.  Reactions should be reported to the Vaccine Adverse Event Reporting System (VAERS). Your doctor should file this report, or you can do it yourself through the VAERS web site at www.vaers.SamedayNews.es, or by calling 575-119-0725. VAERS does not give medical advice. 6. The National Vaccine Injury Compensation Program The [employer] Vaccine Injury Compensation Program (VICP) is a federal program that was created to compensate  people who may have been injured by  certain vaccines. Persons who believe they may have been injured by a vaccine can learn about the program and about filing a claim by calling (819)486-7185 or visiting the [place] website at GoldCloset.com.ee. There is a time limit to file a claim for compensation. 7. How can I learn more?  Ask your healthcare provider. He or she can give you the vaccine package insert or suggest other sources of information.  Call your local or state health department.  Contact the Centers for Disease Control and Prevention (CDC):  Call 740-858-6353 (1-800-CDC-INFO) or  Visit CDC's website at https://gibson.com/ Vaccine Information Statement Inactivated Influenza Vaccine (05/06/2014)   This information is not intended to replace advice given to you by your health care provider. Make sure you discuss any questions you have with your health care provider.   Document Released: 07/11/2006 Document Revised: 10/07/2014 Document Reviewed: 05/09/2014 Elsevier Interactive Patient Education [employer].

## 2016-07-18 NOTE — Progress Notes (Signed)
By signing my name below, I, Mesha Guinyard, attest that this documentation has been prepared under the direction and in the presence of [name], MD.  Electronically Signed: Verlee Monte, Medical Scribe. 07/18/16. 6:00 PM.  Subjective:    Patient ID: Jason [name], male    DOB: 06/19/1966, 50 y.o.   MRN: [id number]  HPI Chief Complaint  Patient presents with  . Annual Exam    depression scale during triage, score 13, pt goes to therapy every 2 wks. Flu shot    HPI Comments: Jason [name] is a 50 y.o. male who presents to the [facility] for his annual exam. He has a hx of anxiety. HLD, HTN, IBS with recurrent abdominal pain, and nephrolithiasis. Last physical was Dec 2016 with Jaynee Eagles, PA-C. Last visit with me was in Oct 2016.  Back Pain: Pt just had 2 MRIs done, followed by specialist. 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 Pt has an appt with specialist Oct 24h. Pt denies bowel or bladder incontinence or saddle anesthesia.  Dysuria: Pt states he's been having dysuria on and off, thought to be due to stones. Pt has been having intermittent dysuria for the "past few days". Pt has been taking Azo for his symptoms. Pt last had protected intercourse 8 months ago. Pt denies hematuria, nausea, vomiting, fever, and back pain.  Allergic Rhinitis: At his physical last year he was treated for suspected allergic rhinitis. Takes Zyrtec QD.  HTN: Creatinine was inc from prev readings of 1.25, but that was at the time of a kidney stone. He has seen Dr. Stanford Breed for evaluation for atypical chest pain. He did have a prev ETT in April 2014 w/o chest pain or ST changes. Takes .5 toprol-xl QD. Pt denies experiencing any negative side effects while on this medication. No recent chest pains.  Lab Results  Component Value Date   CREATININE 1.52 (H) 12/19/2015   HLD: Takes lipitor 20mg . Pt denies experiencing any negative side effects while on this medication. Lab  Results  Component Value Date   CHOL 140 09/28/2015   HDL 60 09/28/2015   LDLCALC 66 09/28/2015   TRIG 71 09/28/2015   CHOLHDL 2.3 09/28/2015   Lab Results  Component Value Date   ALT 17 12/19/2015   AST 33 12/19/2015   ALKPHOS 77 12/19/2015   BILITOT 0.2 (L) 12/19/2015   Erectile Dysfunction: Difficulty obtaining and maintaining erection when discussed at physical Dec 2016. Hx of low testosterone with injection approx 2 years ago, but had not been on injections due to cost at that time. He has not had a recent testosterone level drawn here, how ever his Vit D was nl at 2015 visit. Reportedly had a significant low testosterone level and treated by a physician in [facility] who also treated low Vit D. He apparently declined repeat testosterone testing at last visit. Pt is still having problems maintaining an erection and he's been getting Viagra since it's only $25 per Rx. Pt denies experiencing any side effects while on his medication.  Hyperglycemia: Noted on labs in hospital March 2017 with glucose 177   Abdominal pain: Had multiple surgeries and multiple providers for management of IBS, diverticulosis, and celiac; including [facility]  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 Pt isn't sure if he's had prostate infections in the past. Lab Results  Component Value Date   PSA 0.78 08/01/2014   Immunizations: Immunization History  Administered Date(s) Administered  . Influenza, Seasonal, Injecte, Preservative Fre 12/21/2012  . Influenza,inj,Quad PF,36+ Mos 06/21/2013, 08/01/2014, 07/24/2015, 07/18/2016  . Tdap 01/01/2011   Vision: Pt had an ophtho appt 1 month ago.  Visual Acuity Screening   Right eye Left eye Both eyes  Without correction: 20/15 20/13 20/15   With correction:      Dentist: Pt is followed by a dentist.  Exercise: Pt was running until he had his back problems, Pt has been trying to exercise.  HIV/Hep C Screening: Pt would like  to be screened for HIV and Hep C.  Depression Screening/Anxiety/Sleep Disturbance: Denies suicidal ideation. He's been treated for depression/anxiety in the past and takes wellbutrin xl 300mg  QD and clonazepam PRN in the past. He's also been prescribed topamax 100mg  QD. He is followed by psychologist as well as a psychiatrist, Yehuda Budd, and had inc situational stressors when discussed last year. Pt's mom moved in with him and his partner. Pt was about to leave his relationship but the care of his partener's mother fell on him. Pt still has a psychologist and therapist every 2 weeks.  Pt passed the cut from layoffs at his job. Depression screen [facility] 2/9 07/18/2016 09/14/2015 07/24/2015 08/01/2014 12/21/2012  Decreased Interest 1 0 0 1 1  Down, Depressed, Hopeless 1 1 0 0 1  PHQ - 2 Score 2 1 0 1 2  Altered sleeping 3 - - 1 3  Tired, decreased energy 3 - - 3 3  Change in appetite 2 - - 3 2  Feeling bad or failure about yourself  1 - - 1 1  Trouble concentrating 2 - - 1 1  Moving slowly or fidgety/restless 0 - - 0 2  Suicidal thoughts 0 - - 0 0  PHQ-9 Score 13 - - 10 14  Difficult doing work/chores Not difficult at all - - - -    Patient Active Problem List   Diagnosis Date Noted  . Dyspnea 08/28/2015  . Chest pain 12/24/2012  . Duodenal ulcer, unspecified as acute or chronic, without hemorrhage, perforation, or obstruction 01/14/2011  . CONSTIPATION 07/22/2008  . RECTAL BLEEDING 07/22/2008  . ABDOMINAL PAIN, UNSPECIFIED SITE 07/22/2008  . HYPERLIPIDEMIA 11/20/2007  . ANXIETY 11/20/2007  . HYPERTENSION 11/20/2007  . GERD 11/20/2007  . IRRITABLE BOWEL SYNDROME 11/20/2007  . BACK PAIN, CHRONIC 11/20/2007  . HEADACHE, CHRONIC 11/20/2007  . PALPITATIONS 11/20/2007  . DIVERTICULOSIS, COLON 07/06/2007  . GASTRITIS 01/22/2001  . HIATAL HERNIA 01/22/2001   Past Medical History:  Diagnosis Date  . Allergy   . Anemia    anemic- 11/2010, post bleeding ulcer - treated ./w   bld.  transfusion   . Anxiety state, unspecified   . Arthritis    back, neck , shoulder   . Blood transfusion march 2012  . Blood transfusion without reported diagnosis   . Depression   . Diverticulitis   . Diverticulosis of colon (without mention of hemorrhage)   . Esophageal reflux   . Family history of malignant neoplasm of gastrointestinal tract   . Hernia   . Hiatal hernia   . IBS (irritable bowel syndrome)   . IBS (irritable bowel syndrome)   . Irritable bowel syndrome   . Other and unspecified hyperlipidemia   . Peptic ulcer   . Pneumonia    [facility]  . PONV (postoperative nausea and vomiting)    bowel blockage after may 2012 surgery  . Recurrent upper respiratory infection (URI)    bronchitis- 10/2011-  treated /w antbx- by PCP  . 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 Unspecified essential hypertension    Dr. Stanford Breed- consulted /w pt.  (2008)due to ^ heartrate & SOB /w climbing stairs, had echo /stress- wnl. Currently PCP manages BP    . Unspecified gastritis and gastroduodenitis without mention of hemorrhage    Past Surgical History:  Procedure Laterality Date  . APPENDECTOMY  2000  . BACK SURGERY     X3, lower  back  . BOWEL RESECTION  01/10/2012   Procedure: SMALL BOWEL RESECTION;  Surgeon: Harl Bowie, MD;  Location: [facility];  Service: General;  Laterality: N/A;  . CERVICAL FUSION   2007   c 4   . COLON SURGERY    . HERNIA REPAIR  11/01/2011   incisional hernia  . LAPAROSCOPY  11/01/2011   Procedure: LAPAROSCOPY DIAGNOSTIC;  Surgeon: Harl Bowie, MD;  Location: [facility];  Service: General;  Laterality: N/A;  . LAPAROTOMY  01/10/2012   Procedure: EXPLORATORY LAPAROTOMY;  Surgeon: Harl Bowie, MD;  Location: [facility];  Service: General;  Laterality: N/A;  . SMALL INTESTINE SURGERY  12/2011  . SPINE SURGERY    . STOMACH SURGERY  march 2012   bleeding ulcer  . UMBILICAL HERNIA REPAIR  01/2011   1998,  01/2011 & 11/01/2011    Allergies  Allergen Reactions  . Hydrocodone-Acetaminophen Palpitations    Increased heart rate   Prior to Admission medications   Medication Sig Start Date End Date Taking? Authorizing Provider  amoxicillin (AMOXIL) 875 MG tablet Take 1 tablet (875 mg total) by mouth 2 (two) times daily. 09/29/15   Jaynee Eagles, PA-C  atorvastatin (LIPITOR) 20 MG tablet Take 1 tablet (20 mg total) by mouth daily. 08/28/15   Lelon Perla, MD  buPROPion (WELLBUTRIN XL) 300 MG 24 hr tablet Take 300 mg by mouth daily.    Historical Provider, MD  cetirizine (ZYRTEC) 10 MG tablet Take 1 tablet (10 mg total) by mouth daily. 09/14/15   Jaynee Eagles, PA-C  ciprofloxacin (CIPRO) 500 MG tablet Take 1 tablet (500 mg total) by mouth every 12 (twelve) hours. 12/19/15   Tatyana Kirichenko, PA-C  clonazePAM (KLONOPIN) 1 MG tablet TAKE 1 TO 2 TABLETS BY MOUTH TWICE A DAY AS NEEDED FOR ANXIETY 10/23/14   [name], MD  ibuprofen (ADVIL,MOTRIN) 600 MG tablet Take 1 tablet (600 mg total) by mouth every 6 (six) hours as needed. 12/19/15   Tatyana Kirichenko, PA-C  metoprolol succinate (TOPROL-XL) 25 MG 24 hr tablet TAKE 1/2 TABLET BY MOUTH EVERY DAY 07/02/16   [name], MD  ondansetron (ZOFRAN ODT) 8 MG disintegrating tablet Take 1 tablet (8 mg total) by mouth every 8 (eight) hours as needed for nausea or vomiting. 12/19/15   Jeannett Senior, PA-C  oxyCODONE-acetaminophen (PERCOCET) 5-325 MG tablet Take 1-2 tablets by mouth every 4 (four) hours as needed for severe pain. 12/19/15   Tatyana Kirichenko, PA-C  sildenafil (VIAGRA) 100 MG tablet Take 0.5-1 tablets (50-100 mg total) by mouth daily as needed for erectile dysfunction. 09/14/15   Jaynee Eagles, PA-C  tamsulosin (FLOMAX) 0.4 MG CAPS capsule Take 1 capsule (0.4 mg total) by mouth daily. 12/19/15   Tatyana Kirichenko, PA-C  topiramate (TOPAMAX) 100 MG tablet Take 100 mg by mouth 2 (two) times daily.    Historical Provider, MD   Social History   Social History  .  Marital status: Significant Other  Spouse name: N/A  . Number of children: 2  . Years of education: N/A   Occupational History  . quality analyst   .  [employer]   Social History Main Topics  . Smoking status: Never Smoker  . Smokeless tobacco: Never Used  . Alcohol use 0.0 oz/week     Comment: rarely, 1 or 2 a week  . Drug use: No  . Sexual activity: Yes    Partners: Male     Comment: number of sex partners in the last 64 months 1   Other Topics Concern  . Not on file   Social History Narrative   Exercise walking and running 2 x/weekfor 45 minutes to 1 hour   Review of Systems  Constitutional: Positive for activity change.  Genitourinary: Positive for dysuria.  Musculoskeletal: Positive for back pain and neck pain.  Psychiatric/Behavioral: Positive for sleep disturbance. The patient is nervous/anxious.   13 point ROS negative except for above. Objective:  Physical Exam  Constitutional: He is oriented to person, place, and time. He appears well-developed and well-nourished.  HENT:  Head: Normocephalic and atraumatic.  Right Ear: External ear normal.  Left Ear: External ear normal.  Mouth/Throat: Oropharynx is clear and moist.  Eyes: Conjunctivae and EOM are normal. Pupils are equal, round, and reactive to light.  Neck: Normal range of motion. Neck supple. No thyromegaly present.  Cardiovascular: Normal rate, regular rhythm, normal heart sounds and intact distal pulses.   Pulmonary/Chest: Effort normal and breath sounds normal. No respiratory distress. He has no wheezes.  Abdominal: Soft. He exhibits no distension. There is no tenderness. There is no guarding and no CVA tenderness.  Slight discomfort in abdomen No focal tenderness  Musculoskeletal: Normal range of motion. He exhibits no edema or tenderness.  Lymphadenopathy:    He has no cervical adenopathy.  Neurological: He is alert and oriented to person, place, and time. He has normal reflexes.  Skin:  Skin is warm and dry.  Psychiatric: He has a normal mood and affect. His behavior is normal.  Vitals reviewed.  BP 122/82   Pulse 79   Temp 98 F (36.7 C) (Oral)   Resp 16   Ht 5' 10.5" (1.791 m)   Wt 187 lb 6.4 oz (85 kg)   SpO2 97%   BMI 26.51 kg/m  Assessment & Plan:   Jason [name] is a 50 y.o. male Annual physical exam  - -anticipatory guidance as below in AVS, screening labs above. Health maintenance items as above in HPI discussed/recommended as applicable.   Needs flu shot - Plan: Flu Vaccine QUAD 36+ mos IM  Pure hypercholesterolemia - Plan: Lipid panel, COMPLETE METABOLIC PANEL WITH GFR, atorvastatin (LIPITOR) 20 MG tablet  - tolerating lipitor, no changes, refilled, labs pending.  Essential hypertension - Plan: metoprolol succinate (TOPROL-XL) 25 MG 24 hr tablet  -Stable. Continue Toprol same dose. Labs pending.  Erectile dysfunction, unspecified erectile dysfunction type - Plan: Testosterone, Low testosterone - Plan: Testosterone  -Tolerating Viagra without any side effects. Will check testosterone level, but discussed as this is an afternoon blood draw, would need to be repeated at least once between 8 and 10 AM and likely verified if still low. Depending on that reading, may need evaluation with endocrinology if significantly low as reported in past.  Dysuria - Plan: GC/Chlamydia Probe Amp, POCT urinalysis dipstick, PSA, routine screening for STI (sexually transmitted infection) - Plan: GC/Chlamydia Probe Amp, HIV antibody, RPR  - Check urinalysis, STI . RTC  precautions if worsening.  Hyperglycemia - Plan: Hemoglobin A1c  - Check A1c.   Meds ordered this encounter  Medications  . PRESCRIPTION MEDICATION    Sig: Prazosin 2 mg capsules  . gabapentin (NEURONTIN) 300 MG capsule    Sig: Take 300 mg by mouth 3 (three) times daily.  Marland Kitchen atorvastatin (LIPITOR) 20 MG tablet    Sig: Take 1 tablet (20 mg total) by mouth daily.    Dispense:  90 tablet    Refill:  1  .  metoprolol succinate (TOPROL-XL) 25 MG 24 hr tablet    Sig: Take 0.5 tablets (12.5 mg total) by mouth daily.    Dispense:  90 tablet    Refill:  0   Patient Instructions    I will check a testosterone level today, but if that is low, it will need to be repeated between 8 and 10 in the morning at least once. If it is still significantly low as you have the past, you may need further workup with an endocrinologist to look at other causes of that low testosterone.  No change in your other medications for now. I will check a prostate test, and other urine tests to look at infection, but based on the episodic symptoms, less likely acute infection.  Follow-up with me in 6 months, sooner if new or worsening symptoms. Keeping you healthy  Get these tests  Blood pressure- Have your blood pressure checked once a year by your healthcare provider.  Normal blood pressure is 120/80.  Weight- Have your body mass index (BMI) calculated to screen for obesity.  BMI is a measure of body fat based on height and weight. You can also calculate your own BMI at GravelBags.it.  Cholesterol- Have your cholesterol checked regularly starting at age 95, sooner may be necessary if you have diabetes, high blood pressure, if a family member developed heart diseases at an early age or if you smoke.   Chlamydia, HIV, and other sexual transmitted disease- Get screened each year until the age of 53 then within three months of each new sexual partner.  Diabetes- Have your blood sugar checked regularly if you have high blood pressure, high cholesterol, a family history of diabetes or if you are overweight.  Get these vaccines  Flu shot- Every fall.  Tetanus shot- Every 10 years.  Menactra- Single dose; prevents meningitis.  Take these steps  Don't smoke- If you do smoke, ask your healthcare provider about quitting. For tips on how to quit, go to www.smokefree.gov or call 1-800-QUIT-NOW.  Be physically  active- Exercise 5 days a week for at least 30 minutes.  If you are not already physically active start slow and gradually work up to 30 minutes of moderate physical activity.  Examples of moderate activity include walking briskly, mowing the yard, dancing, swimming bicycling, etc.  Eat a healthy diet- Eat a variety of healthy foods such as fruits, vegetables, low fat milk, low fat cheese, yogurt, lean meats, poultry, fish, beans, tofu, etc.  For more information on healthy eating, go to www.thenutritionsource.org  Drink alcohol in moderation- Limit alcohol intake two drinks or less a day.  Never drink and drive.  Dentist- Brush and floss teeth twice daily; visit your dentis twice a year.  Depression-Your emotional health is as important as your physical health.  If you're feeling down, losing interest in things you normally enjoy please talk with your healthcare provider.  Gun Safety- If you keep a gun in your home, keep it  unloaded and with the safety lock on.  Bullets should be stored separately.  Helmet use- Always wear a helmet when riding a motorcycle, bicycle, rollerblading or skateboarding.  Safe sex- If you may be exposed to a sexually transmitted infection, use a condom  Seat belts- Seat bels can save your life; always wear one.  Smoke/Carbon Monoxide detectors- These detectors need to be installed on the appropriate level of your home.  Replace batteries at least once a year.  Skin Cancer- When out in the sun, cover up and use sunscreen SPF 15 or higher.  Violence- If anyone is threatening or hurting you, please tell your healthcare provider.   IF you received an x-ray today, you will receive an invoice from [facility]. Please contact [facility] at [phone] with questions or concerns regarding your invoice.   IF you received labwork today, you will receive an invoice from [employer]. Please contact [facility] at [phone] with  questions or concerns regarding your invoice.   Our billing staff will not be able to assist you with questions regarding bills from these companies.  You will be contacted with the lab results as soon as they are available. The fastest way to get your results is to activate your My Chart account. Instructions are located on the last page of this paperwork. If you have not heard from [place] regarding the results in 2 weeks, please contact this office.    Influenza (Flu) Vaccine (Inactivated or Recombinant):  1. Why get vaccinated? Influenza ("flu") is a contagious disease that spreads around the [place] every year, usually between October and May. Flu is caused by influenza viruses, and is spread mainly by coughing, sneezing, and close contact. Anyone can get flu. Flu strikes suddenly and can last several days. Symptoms vary by age, but can include:  fever/chills  sore throat  muscle aches  fatigue  cough  headache  runny or stuffy nose Flu can also lead to pneumonia and blood infections, and cause diarrhea and seizures in children. If you have a medical condition, such as heart or lung disease, flu can make it worse. Flu is more dangerous for some people. Infants and young children, people 61 years of age and older, pregnant women, and people with certain health conditions or a weakened immune system are at greatest risk. Each year thousands of people in the [place] die from flu, and many more are hospitalized. Flu vaccine can:  keep you from getting flu,  make flu less severe if you do get it, and  keep you from spreading flu to your family and other people. 2. Inactivated and recombinant flu vaccines A dose of flu vaccine is recommended every flu season. Children 6 months through 59 years of age may need two doses during the same flu season. Everyone else needs only one dose each flu season. Some inactivated flu vaccines contain a very small amount of a mercury-based  preservative called thimerosal. Studies have not shown thimerosal in vaccines to be harmful, but flu vaccines that do not contain thimerosal are available. There is no live flu virus in flu shots. They cannot cause the flu. There are many flu viruses, and they are always changing. Each year a new flu vaccine is made to protect against three or four viruses that are likely to cause disease in the upcoming flu season. But even when the vaccine doesn't exactly match these viruses, it may still provide some protection. Flu vaccine cannot prevent:  flu  that is caused by a virus not covered by the vaccine, or  illnesses that look like flu but are not. It takes about 2 weeks for protection to develop after vaccination, and protection lasts through the flu season. 3. Some people should not get this vaccine Tell the person who is giving you the vaccine:  If you have any severe, life-threatening allergies. If you ever had a life-threatening allergic reaction after a dose of flu vaccine, or have a severe allergy to any part of this vaccine, you may be advised not to get vaccinated. Most, but not all, types of flu vaccine contain a small amount of egg protein.  If you ever had Guillain-Barre Syndrome (also called GBS). Some people with a history of GBS should not get this vaccine. This should be discussed with your doctor.  If you are not feeling well. It is usually okay to get flu vaccine when you have a mild illness, but you might be asked to come back when you feel better. 4. Risks of a vaccine reaction With any medicine, including vaccines, there is a chance of reactions. These are usually mild and go away on their own, but serious reactions are also possible. Most people who get a flu shot do not have any problems with it. Minor problems following a flu shot include:  soreness, redness, or swelling where the shot was given  hoarseness  sore, red or itchy  eyes  cough  fever  aches  headache  itching  fatigue If these problems occur, they usually begin soon after the shot and last 1 or 2 days. More serious problems following a flu shot can include the following:  There may be a small increased risk of Guillain-Barre Syndrome (GBS) after inactivated flu vaccine. This risk has been estimated at 1 or 2 additional cases per million people vaccinated. This is much lower than the risk of severe complications from flu, which can be prevented by flu vaccine.  Young children who get the flu shot along with pneumococcal vaccine (PCV13) and/or DTaP vaccine at the same time might be slightly more likely to have a seizure caused by fever. Ask your doctor for more information. Tell your doctor if a child who is getting flu vaccine has ever had a seizure. Problems that could happen after any injected vaccine:  People sometimes faint after a medical procedure, including vaccination. Sitting or lying down for about 15 minutes can help prevent fainting, and injuries caused by a fall. Tell your doctor if you feel dizzy, or have vision changes or ringing in the ears.  Some people get severe pain in the shoulder and have difficulty moving the arm where a shot was given. This happens very rarely.  Any medication can cause a severe allergic reaction. Such reactions from a vaccine are very rare, estimated at about 1 in a million doses, and would happen within a few minutes to a few hours after the vaccination. As with any medicine, there is a very remote chance of a vaccine causing a serious injury or death. The safety of vaccines is always being monitored. For more information, visit: http://www.aguilar.org/ 5. What if there is a serious reaction? What should I look for?  Look for anything that concerns you, such as signs of a severe allergic reaction, very high fever, or unusual behavior. Signs of a severe allergic reaction can include hives, swelling of  the face and throat, difficulty breathing, a fast heartbeat, dizziness, and weakness. These would start a  few minutes to a few hours after the vaccination. What should I do?  If you think it is a severe allergic reaction or other emergency that can't wait, call 9-1-1 and get the person to the nearest hospital. Otherwise, call your doctor.  Reactions should be reported to the Vaccine Adverse Event Reporting System (VAERS). Your doctor should file this report, or you can do it yourself through the VAERS web site at www.vaers.SamedayNews.es, or by calling (202)479-5580. VAERS does not give medical advice. 6. The National Vaccine Injury Compensation Program The [employer] Vaccine Injury Compensation Program (VICP) is a federal program that was created to compensate people who may have been injured by certain vaccines. Persons who believe they may have been injured by a vaccine can learn about the program and about filing a claim by calling [phone] or visiting the [place] website at GoldCloset.com.ee. There is a time limit to file a claim for compensation. 7. How can I learn more?  Ask your healthcare provider. He or she can give you the vaccine package insert or suggest other sources of information.  Call your local or state health department.  Contact the Centers for Disease Control and Prevention (CDC):  Call 339-449-7278 (1-800-CDC-INFO) or  Visit CDC's website at https://gibson.com/ Vaccine Information Statement Inactivated Influenza Vaccine (05/06/2014)   This information is not intended to replace advice given to you by your health care provider. Make sure you discuss any questions you have with your health care provider.   Document Released: 07/11/2006 Document Revised: 10/07/2014 Document Reviewed: 05/09/2014 Elsevier Interactive Patient Education 2016 Elsevier Inc. Influenza (Flu) Vaccine (Inactivated or Recombinant):  1. Why get vaccinated? Influenza ("flu") is a  contagious disease that spreads around the [place] every year, usually between October and May. Flu is caused by influenza viruses, and is spread mainly by coughing, sneezing, and close contact. Anyone can get flu. Flu strikes suddenly and can last several days. Symptoms vary by age, but can include:  fever/chills  sore throat  muscle aches  fatigue  cough  headache  runny or stuffy nose Flu can also lead to pneumonia and blood infections, and cause diarrhea and seizures in children. If you have a medical condition, such as heart or lung disease, flu can make it worse. Flu is more dangerous for some people. Infants and young children, people 42 years of age and older, pregnant women, and people with certain health conditions or a weakened immune system are at greatest risk. Each year thousands of people in the [place] die from flu, and many more are hospitalized. Flu vaccine can:  keep you from getting flu,  make flu less severe if you do get it, and  keep you from spreading flu to your family and other people. 2. Inactivated and recombinant flu vaccines A dose of flu vaccine is recommended every flu season. Children 6 months through 45 years of age may need two doses during the same flu season. Everyone else needs only one dose each flu season. Some inactivated flu vaccines contain a very small amount of a mercury-based preservative called thimerosal. Studies have not shown thimerosal in vaccines to be harmful, but flu vaccines that do not contain thimerosal are available. There is no live flu virus in flu shots. They cannot cause the flu. There are many flu viruses, and they are always changing. Each year a new flu vaccine is made to protect against three or four viruses that are likely to cause disease in the upcoming flu season.  But even when the vaccine doesn't exactly match these viruses, it may still provide some protection. Flu vaccine cannot prevent:  flu that is  caused by a virus not covered by the vaccine, or  illnesses that look like flu but are not. It takes about 2 weeks for protection to develop after vaccination, and protection lasts through the flu season. 3. Some people should not get this vaccine Tell the person who is giving you the vaccine:  If you have any severe, life-threatening allergies. If you ever had a life-threatening allergic reaction after a dose of flu vaccine, or have a severe allergy to any part of this vaccine, you may be advised not to get vaccinated. Most, but not all, types of flu vaccine contain a small amount of egg protein.  If you ever had Guillain-Barre Syndrome (also called GBS). Some people with a history of GBS should not get this vaccine. This should be discussed with your doctor.  If you are not feeling well. It is usually okay to get flu vaccine when you have a mild illness, but you might be asked to come back when you feel better. 4. Risks of a vaccine reaction With any medicine, including vaccines, there is a chance of reactions. These are usually mild and go away on their own, but serious reactions are also possible. Most people who get a flu shot do not have any problems with it. Minor problems following a flu shot include:  soreness, redness, or swelling where the shot was given  hoarseness  sore, red or itchy eyes  cough  fever  aches  headache  itching  fatigue If these problems occur, they usually begin soon after the shot and last 1 or 2 days. More serious problems following a flu shot can include the following:  There may be a small increased risk of Guillain-Barre Syndrome (GBS) after inactivated flu vaccine. This risk has been estimated at 1 or 2 additional cases per million people vaccinated. This is much lower than the risk of severe complications from flu, which can be prevented by flu vaccine.  Young children who get the flu shot along with pneumococcal vaccine (PCV13) and/or DTaP  vaccine at the same time might be slightly more likely to have a seizure caused by fever. Ask your doctor for more information. Tell your doctor if a child who is getting flu vaccine has ever had a seizure. Problems that could happen after any injected vaccine:  People sometimes faint after a medical procedure, including vaccination. Sitting or lying down for about 15 minutes can help prevent fainting, and injuries caused by a fall. Tell your doctor if you feel dizzy, or have vision changes or ringing in the ears.  Some people get severe pain in the shoulder and have difficulty moving the arm where a shot was given. This happens very rarely.  Any medication can cause a severe allergic reaction. Such reactions from a vaccine are very rare, estimated at about 1 in a million doses, and would happen within a few minutes to a few hours after the vaccination. As with any medicine, there is a very remote chance of a vaccine causing a serious injury or death. The safety of vaccines is always being monitored. For more information, visit: http://www.aguilar.org/ 5. What if there is a serious reaction? What should I look for?  Look for anything that concerns you, such as signs of a severe allergic reaction, very high fever, or unusual behavior. Signs of a severe allergic  reaction can include hives, swelling of the face and throat, difficulty breathing, a fast heartbeat, dizziness, and weakness. These would start a few minutes to a few hours after the vaccination. What should I do?  If you think it is a severe allergic reaction or other emergency that can't wait, call 9-1-1 and get the person to the nearest hospital. Otherwise, call your doctor.  Reactions should be reported to the Vaccine Adverse Event Reporting System (VAERS). Your doctor should file this report, or you can do it yourself through the VAERS web site at www.vaers.SamedayNews.es, or by calling 770-047-1655. VAERS does not give medical  advice. 6. The National Vaccine Injury Compensation Program The [employer] Vaccine Injury Compensation Program (VICP) is a federal program that was created to compensate people who may have been injured by certain vaccines. Persons who believe they may have been injured by a vaccine can learn about the program and about filing a claim by calling 6416900389 or visiting the [place] website at GoldCloset.com.ee. There is a time limit to file a claim for compensation. 7. How can I learn more?  Ask your healthcare provider. He or she can give you the vaccine package insert or suggest other sources of information.  Call your local or state health department.  Contact the Centers for Disease Control and Prevention (CDC):  Call 231-177-8418 (1-800-CDC-INFO) or  Visit CDC's website at https://gibson.com/ Vaccine Information Statement Inactivated Influenza Vaccine (05/06/2014)   This information is not intended to replace advice given to you by your health care provider. Make sure you discuss any questions you have with your health care provider.   Document Released: 07/11/2006 Document Revised: 10/07/2014 Document Reviewed: 05/09/2014 Elsevier Interactive Patient Education [employer].    I personally performed the services described in this documentation, which was scribed in my presence. The recorded information has been reviewed and considered, and addended by me as needed.   Signed,   [name], MD [facility].  07/19/16 11:28 PM

## 2016-07-19 LAB — COMPLETE METABOLIC PANEL WITH GFR
ALBUMIN: 5 g/dL (ref 3.6–5.1)
ALK PHOS: 82 U/L (ref 40–115)
ALT: 17 U/L (ref 9–46)
AST: 27 U/L (ref 10–40)
BILIRUBIN TOTAL: 0.5 mg/dL (ref 0.2–1.2)
BUN: 17 mg/dL (ref 7–25)
CO2: 22 mmol/L (ref 20–31)
Calcium: 9.3 mg/dL (ref 8.6–10.3)
Chloride: 107 mmol/L (ref 98–110)
Creat: 1.38 mg/dL — ABNORMAL HIGH (ref 0.60–1.35)
GFR, Est African American: 69 mL/min (ref >=60)
GFR, Est Non African American: 60 mL/min (ref >=60)
GLUCOSE: 90 mg/dL (ref 65–99)
POTASSIUM: 3.8 mmol/L (ref 3.5–5.3)
SODIUM: 142 mmol/L (ref 135–146)
TOTAL PROTEIN: 7.7 g/dL (ref 6.1–8.1)

## 2016-07-19 LAB — LIPID PANEL
CHOL/HDL RATIO: 2.8 ratio (ref <=5.0)
Cholesterol: 155 mg/dL (ref 125–200)
HDL: 55 mg/dL (ref >=40)
LDL Cholesterol: 84 mg/dL (ref <130)
TRIGLYCERIDES: 78 mg/dL (ref <150)
VLDL: 16 mg/dL (ref <30)

## 2016-07-19 LAB — HEMOGLOBIN A1C
HEMOGLOBIN A1C: 5.3 % (ref <5.7)
MEAN PLASMA GLUCOSE: 105 mg/dL

## 2016-07-19 LAB — PSA: PSA: 0.6 ng/mL (ref <=4.0)

## 2016-07-20 LAB — RPR

## 2016-07-20 LAB — HIV ANTIBODY (ROUTINE TESTING W REFLEX): HIV 1&2 Ab, 4th Generation: NONREACTIVE

## 2016-07-20 LAB — TESTOSTERONE: TESTOSTERONE: 468 ng/dL (ref 250–827)

## 2016-07-22 LAB — GC/CHLAMYDIA PROBE AMP
CT PROBE, AMP APTIMA: NOT DETECTED
GC PROBE AMP APTIMA: NOT DETECTED

## 2016-08-19 ENCOUNTER — Ambulatory Visit (INDEPENDENT_AMBULATORY_CARE_PROVIDER_SITE_OTHER): Payer: BLUE CROSS/BLUE SHIELD | Admitting: Physician Assistant

## 2016-08-19 ENCOUNTER — Ambulatory Visit (INDEPENDENT_AMBULATORY_CARE_PROVIDER_SITE_OTHER): Payer: BLUE CROSS/BLUE SHIELD

## 2016-08-19 VITALS — BP 122/72 | HR 111 | Temp 98.1°F | Resp 17 | Ht 71.0 in | Wt 192.0 lb

## 2016-08-19 DIAGNOSIS — R311 Benign essential microscopic hematuria: Secondary | ICD-10-CM | POA: Diagnosis not present

## 2016-08-19 DIAGNOSIS — E860 Dehydration: Secondary | ICD-10-CM

## 2016-08-19 DIAGNOSIS — R1084 Generalized abdominal pain: Secondary | ICD-10-CM

## 2016-08-19 DIAGNOSIS — K5900 Constipation, unspecified: Secondary | ICD-10-CM | POA: Diagnosis not present

## 2016-08-19 LAB — POCT URINALYSIS DIP (MANUAL ENTRY)
Glucose, UA: NEGATIVE
LEUKOCYTES UA: NEGATIVE
Nitrite, UA: NEGATIVE
PH UA: 6
SPEC GRAV UA: 1.02
Urobilinogen, UA: 0.2

## 2016-08-19 LAB — POC MICROSCOPIC URINALYSIS (UMFC): Mucus: ABSENT

## 2016-08-19 LAB — POCT CBC
GRANULOCYTE PERCENT: 70.5 % (ref 37–80)
HCT, POC: 39.7 % — AB (ref 43.5–53.7)
Hemoglobin: 13.6 g/dL — AB (ref 14.1–18.1)
Lymph, poc: 1.6 (ref 0.6–3.4)
MCH: 29.7 pg (ref 27–31.2)
MCHC: 34.4 g/dL (ref 31.8–35.4)
MCV: 86.5 fL (ref 80–97)
MID (cbc): 0.9 (ref 0–0.9)
MPV: 7.4 fL (ref 0–99.8)
PLATELET COUNT, POC: 177 10*3/uL (ref 142–424)
POC Granulocyte: 6 (ref 2–6.9)
POC LYMPH PERCENT: 19.3 %L (ref 10–50)
POC MID %: 10.2 %M (ref 0–12)
RBC: 4.28 M/uL — AB (ref 4.69–6.13)
RDW, POC: 13.2 %
WBC: 8.5 10*3/uL (ref 4.6–10.2)

## 2016-08-19 LAB — GLUCOSE, POCT (MANUAL RESULT ENTRY): POC GLUCOSE: 118 mg/dL — AB (ref 70–99)

## 2016-08-19 MED ORDER — LACTULOSE 20 G PO PACK: 20.0000 g | PACK | Freq: Two times a day (BID) | ORAL | 0 refills | Status: DC

## 2016-08-19 MED ORDER — KETOROLAC TROMETHAMINE 60 MG/2ML IM SOLN
60.0000 mg | Freq: Once | INTRAMUSCULAR | Status: AC
  Administered 2016-08-19: 60 mg via INTRAMUSCULAR

## 2016-08-19 NOTE — Patient Instructions (Signed)
     IF you received an x-ray today, you will receive an invoice from [facility]. Please contact [facility] at [phone] with questions or concerns regarding your invoice.   IF you received labwork today, you will receive an invoice from [facility]. Please contact [facility] at [phone] with questions or concerns regarding your invoice.   Our billing staff will not be able to assist you with questions regarding bills from these companies.  You will be contacted with the lab results as soon as they are available. The fastest way to get your results is to activate your My Chart account. Instructions are located on the last page of this paperwork. If you have not heard from us regarding the results in 2 weeks, please contact this office.      

## 2016-08-19 NOTE — Progress Notes (Addendum)
08/20/2016 4:13 PM   DOB: 15-Sep-1966 / MRN: [id number]  SUBJECTIVE:  Jason [name] is a 50 y.o. male presenting for inability to defecate for 8 days.  States he has tried copious OTC laxatives.  Reports 4 days ago he began having some pain in the left side. 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  He has been told he has ulcerative colitis. He denies the use of any narcotics at this time. Reports that he has not slept in three days today.    He is allergic to hydrocodone-acetaminophen.   He  has a past medical history of Allergy; Anemia; Anxiety state, unspecified; Arthritis; Blood transfusion (march 2012); Blood transfusion without reported diagnosis; Depression; Diverticulitis; Diverticulosis of colon (without mention of hemorrhage); Esophageal reflux; Family history of malignant neoplasm of gastrointestinal tract; Hernia; Hiatal hernia; IBS (irritable bowel syndrome); IBS (irritable bowel syndrome); Irritable bowel syndrome; Other and unspecified hyperlipidemia; Peptic ulcer; Pneumonia; PONV (postoperative nausea and vomiting); Recurrent upper respiratory infection (URI); Spondylitis, 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    He  reports that he has never smoked. He has never used smokeless tobacco. He reports that he drinks alcohol. He reports that he does not use drugs. He  reports that he currently engages in sexual activity and has had male partners. The patient  has a past surgical history that includes Umbilical hernia repair (01/2011); Cervical fusion ( 2007); Stomach surgery (march 2012); Back surgery; Appendectomy (2000); laparoscopy (11/01/2011); laparotomy (01/10/2012); Bowel resection (01/10/2012); Small intestine surgery (12/2011); Hernia repair (11/01/2011); Colon surgery; and Spine surgery.  His family history includes COPD in his sister;  Cancer in his paternal grandmother; Clotting disorder in his mother; Colon cancer in his maternal grandmother; Dementia in his mother; Diabetes in his father and paternal grandmother; Esophageal cancer in his maternal grandmother; Heart disease in his father, mother, paternal grandfather, and sister; Hyperlipidemia in his paternal grandmother; Hypertension in his father, mother, paternal grandfather, and sister; Leukemia in his brother; Mental illness in his sister; Pancreatic cancer in his maternal grandfather; Stomach cancer in his maternal grandmother; Stroke in his mother; Uterine cancer in his sister.  Review of Systems  Constitutional: Negative for chills and fever.  Respiratory: Negative for cough.   Gastrointestinal: Positive for abdominal pain and constipation. Negative for blood in stool, diarrhea, heartburn, melena, nausea and vomiting.  Genitourinary: Negative for dysuria, frequency and urgency.  Neurological: Negative for dizziness.    The problem list and medications were reviewed and updated by myself where necessary and exist elsewhere in the encounter.   OBJECTIVE:  BP 122/72 (BP Location: Right Arm, Patient Position: Sitting, Cuff Size: Normal)   Pulse (!) 111   Temp 98.1 F (36.7 C) (Oral)   Resp 17   Ht 5\' 11"  (1.803 m)   Wt 192 lb (87.1 kg)   SpO2 97%   BMI 26.78 kg/m   Lab Results  Component Value Date   CREATININE 2.48 (H) 08/19/2016    Physical Exam  Constitutional: He is oriented to person, place, and time. He appears well-developed. He does not appear ill.  HENT:  Mouth/Throat: Uvula is midline and oropharynx is clear and moist. Mucous membranes are not pale, dry and not cyanotic.  Eyes: Conjunctivae and EOM are normal. Pupils are equal, round, and reactive to light.  Cardiovascular: Regular rhythm, normal heart sounds and intact distal pulses.  Tachycardia  present.   Pulmonary/Chest: Effort normal.  Abdominal: Soft. Bowel sounds are normal. He exhibits  no distension and no mass. There is no tenderness. There is no rebound and no guarding.  Genitourinary:  Genitourinary Comments: DRE negative for stool in the vault.  Negative for prostatic tenderness.   Musculoskeletal: Normal range of motion.  Neurological: He is alert and oriented to person, place, and time. No cranial nerve deficit. Coordination normal.  Skin: Skin is warm and dry. He is not diaphoretic.  Psychiatric: He has a normal mood and affect.  Nursing note and vitals reviewed.   Results for orders placed or performed in visit on 08/19/16 (from the past 72 hour(s))  POCT CBC     Status: Abnormal   Collection Time: 08/19/16  6:23 PM  Result Value Ref Range   WBC 8.5 4.6 - 10.2 K/uL   Lymph, poc 1.6 0.6 - 3.4   POC LYMPH PERCENT 19.3 10 - 50 %L   MID (cbc) 0.9 0 - 0.9   POC MID % 10.2 0 - 12 %M   POC Granulocyte 6.0 2 - 6.9   Granulocyte percent 70.5 37 - 80 %G   RBC 4.28 (A) 4.69 - 6.13 M/uL   Hemoglobin 13.6 (A) 14.1 - 18.1 g/dL   HCT, POC 39.7 (A) 43.5 - 53.7 %   MCV 86.5 80 - 97 fL   MCH, POC 29.7 27 - 31.2 pg   MCHC 34.4 31.8 - 35.4 g/dL   RDW, POC 13.2 %   Platelet Count, POC 177 142 - 424 K/uL   MPV 7.4 0 - 99.8 fL  POCT glucose (manual entry)     Status: Abnormal   Collection Time: 08/19/16  6:27 PM  Result Value Ref Range   POC Glucose 118 (A) 70 - 99 mg/dl  POCT urinalysis dipstick     Status: Abnormal   Collection Time: 08/19/16  6:48 PM  Result Value Ref Range   Color, UA yellow yellow   Clarity, UA clear clear   Glucose, UA negative negative   Bilirubin, UA small (A) negative   Ketones, POC UA small (15) (A) negative   Spec Grav, UA 1.020    Blood, UA trace-lysed (A) negative   pH, UA 6.0    Protein Ur, POC =100 (A) negative   Urobilinogen, UA 0.2    Nitrite, UA Negative Negative   Leukocytes, UA Negative Negative  POCT Microscopic Urinalysis ([facility])     Status: None   Collection Time: 08/19/16  7:02 PM  Result Value Ref Range   WBC,UR,HPF,POC  None None WBC/hpf   RBC,UR,HPF,POC None None RBC/hpf   Bacteria None None, Too numerous to count   Mucus Absent Absent   Epithelial Cells, UR Per Microscopy None None, Too numerous to count cells/hpf  COMPLETE METABOLIC PANEL WITH GFR     Status: Abnormal   Collection Time: 08/19/16  7:18 PM  Result Value Ref Range   Sodium 144 135 - 146 mmol/L   Potassium 3.4 (L) 3.5 - 5.3 mmol/L   Chloride 110 98 - 110 mmol/L   CO2 23 20 - 31 mmol/L   Glucose, Bld 115 (H) 65 - 99 mg/dL   BUN 24 7 - 25 mg/dL   Creat 2.48 (H) 0.60 - 1.35 mg/dL   Total Bilirubin 0.4 0.2 - 1.2 mg/dL   Alkaline Phosphatase 70 40 - 115 U/L   AST 18 10 - 40 U/L   ALT 9 9 - 46 U/L  Total Protein 7.0 6.1 - 8.1 g/dL   Albumin 4.4 3.6 - 5.1 g/dL   Calcium 9.0 8.6 - 10.3 mg/dL   GFR, Est African American 34 (L) >=60 mL/min   GFR, Est Non African American 29 (L) >=60 mL/min  Lipase     Status: None   Collection Time: 08/19/16  7:18 PM  Result Value Ref Range   Lipase 44 7 - 60 U/L    Dg Abd 2 Views  Result Date: 08/19/2016 CLINICAL DATA:  No bowel movement for 2 weeks. History of constipation and small bowel resection 2013. EXAM: ABDOMEN - 2 VIEW COMPARISON:  Abdominal radiograph March 28, 2016 FINDINGS: The bowel gas pattern is normal. Gas-filled nondistended small and large bowel without significant retained large bowel stool. There is no evidence of free air. No radio-opaque calculi or other significant radiographic abnormality is seen. L5-S1 laminectomy, L4-5 PLIF. IMPRESSION: Normal bowel gas pattern. No significant retained large bowel stool. Electronically Signed   By: Elon Alas M.D.   On: 08/19/2016 18:37    ASSESSMENT AND PLAN  Isaha was seen today for patient states he might have a small bowel obstruction.  Diagnoses and all orders for this visit:  Generalized abdominal pain: Secondary to problem 2. His rads and work up were benign as of 08/19/16. He looks better after 1 liter of fluid and Toradol  here.  Labs today (08/20/2016) showing AKI, most likely prerenal.  I have call and left a message on his VM to RTC or go directly to the ED.  -     POCT urinalysis dipstick -     POCT CBC -     POCT glucose (manual entry) -     DG Abd 2 Views; Future -     ketorolac (TORADOL) injection 60 mg; Inject 2 mLs (60 mg total) into the muscle once. -     COMPLETE METABOLIC PANEL WITH GFR -     Lipase  Constipation, unspecified constipation type -     lactulose (CEPHULAC) 20 g packet; Take 1 packet (20 g total) by mouth 2 (two) times daily.  Benign essential microscopic hematuria -     POCT Microscopic Urinalysis ([facility])  Dehydration -     Insert peripheral IV    The patient is advised to call or return to clinic if he does not see an improvement in symptoms, or to seek the care of the closest emergency department if he worsens with the above plan.   Philis Fendt, MHS, PA-C [facility] 08/20/2016 4:13 PM

## 2016-08-19 NOTE — Progress Notes (Signed)
IV discontinued at 7:22pm.

## 2016-08-20 ENCOUNTER — Emergency Department (HOSPITAL_COMMUNITY): Payer: BLUE CROSS/BLUE SHIELD

## 2016-08-20 ENCOUNTER — Ambulatory Visit (INDEPENDENT_AMBULATORY_CARE_PROVIDER_SITE_OTHER): Payer: BLUE CROSS/BLUE SHIELD | Admitting: Urgent Care

## 2016-08-20 ENCOUNTER — Observation Stay (HOSPITAL_COMMUNITY)
Admission: EM | Admit: 2016-08-20 | Discharge: 2016-08-21 | Disposition: A | Discharge status: Home / Self Care | Payer: BLUE CROSS/BLUE SHIELD | Attending: Urology | Admitting: Urology

## 2016-08-20 ENCOUNTER — Encounter (HOSPITAL_COMMUNITY): Payer: Self-pay | Admitting: Emergency Medicine

## 2016-08-20 VITALS — BP 148/72 | HR 105 | Temp 98.6°F | Resp 16 | Ht 71.0 in | Wt 197.0 lb

## 2016-08-20 DIAGNOSIS — Z823 Family history of stroke: Secondary | ICD-10-CM | POA: Diagnosis not present

## 2016-08-20 DIAGNOSIS — F329 Major depressive disorder, single episode, unspecified: Secondary | ICD-10-CM | POA: Diagnosis not present

## 2016-08-20 DIAGNOSIS — E785 Hyperlipidemia, unspecified: Secondary | ICD-10-CM | POA: Diagnosis not present

## 2016-08-20 DIAGNOSIS — F419 Anxiety disorder, unspecified: Secondary | ICD-10-CM | POA: Insufficient documentation

## 2016-08-20 DIAGNOSIS — Z825 Family history of asthma and other chronic lower respiratory diseases: Secondary | ICD-10-CM | POA: Diagnosis not present

## 2016-08-20 DIAGNOSIS — Z8711 Personal history of peptic ulcer disease: Secondary | ICD-10-CM | POA: Diagnosis not present

## 2016-08-20 DIAGNOSIS — Z8249 Family history of ischemic heart disease and other diseases of the circulatory system: Secondary | ICD-10-CM | POA: Insufficient documentation

## 2016-08-20 DIAGNOSIS — Z833 Family history of diabetes mellitus: Secondary | ICD-10-CM | POA: Insufficient documentation

## 2016-08-20 DIAGNOSIS — I10 Essential (primary) hypertension: Secondary | ICD-10-CM | POA: Insufficient documentation

## 2016-08-20 DIAGNOSIS — E860 Dehydration: Secondary | ICD-10-CM

## 2016-08-20 DIAGNOSIS — R109 Unspecified abdominal pain: Secondary | ICD-10-CM | POA: Diagnosis not present

## 2016-08-20 DIAGNOSIS — N200 Calculus of kidney: Secondary | ICD-10-CM | POA: Diagnosis present

## 2016-08-20 DIAGNOSIS — R7989 Other specified abnormal findings of blood chemistry: Secondary | ICD-10-CM | POA: Diagnosis not present

## 2016-08-20 DIAGNOSIS — N201 Calculus of ureter: Secondary | ICD-10-CM | POA: Diagnosis present

## 2016-08-20 DIAGNOSIS — R1084 Generalized abdominal pain: Secondary | ICD-10-CM | POA: Diagnosis not present

## 2016-08-20 DIAGNOSIS — N179 Acute kidney failure, unspecified: Secondary | ICD-10-CM | POA: Insufficient documentation

## 2016-08-20 DIAGNOSIS — Z87442 Personal history of urinary calculi: Secondary | ICD-10-CM | POA: Insufficient documentation

## 2016-08-20 DIAGNOSIS — N132 Hydronephrosis with renal and ureteral calculous obstruction: Secondary | ICD-10-CM | POA: Diagnosis not present

## 2016-08-20 DIAGNOSIS — Z981 Arthrodesis status: Secondary | ICD-10-CM | POA: Insufficient documentation

## 2016-08-20 DIAGNOSIS — K5900 Constipation, unspecified: Secondary | ICD-10-CM | POA: Diagnosis not present

## 2016-08-20 DIAGNOSIS — K219 Gastro-esophageal reflux disease without esophagitis: Secondary | ICD-10-CM | POA: Diagnosis not present

## 2016-08-20 DIAGNOSIS — Z806 Family history of leukemia: Secondary | ICD-10-CM | POA: Diagnosis not present

## 2016-08-20 DIAGNOSIS — Z800 Family history of malignant neoplasm of digestive organs: Secondary | ICD-10-CM | POA: Insufficient documentation

## 2016-08-20 DIAGNOSIS — K449 Diaphragmatic hernia without obstruction or gangrene: Secondary | ICD-10-CM | POA: Insufficient documentation

## 2016-08-20 DIAGNOSIS — Z885 Allergy status to narcotic agent status: Secondary | ICD-10-CM | POA: Insufficient documentation

## 2016-08-20 DIAGNOSIS — N289 Disorder of kidney and ureter, unspecified: Secondary | ICD-10-CM

## 2016-08-20 DIAGNOSIS — K589 Irritable bowel syndrome without diarrhea: Secondary | ICD-10-CM | POA: Insufficient documentation

## 2016-08-20 HISTORY — DX: Disorder of kidney and ureter, unspecified: N28.9

## 2016-08-20 LAB — COMPLETE METABOLIC PANEL WITH GFR
ALBUMIN: 4.4 g/dL (ref 3.6–5.1)
ALK PHOS: 70 U/L (ref 40–115)
ALT: 9 U/L (ref 9–46)
AST: 18 U/L (ref 10–40)
BUN: 24 mg/dL (ref 7–25)
CO2: 23 mmol/L (ref 20–31)
Calcium: 9 mg/dL (ref 8.6–10.3)
Chloride: 110 mmol/L (ref 98–110)
Creat: 2.48 mg/dL — ABNORMAL HIGH (ref 0.60–1.35)
GFR, EST NON AFRICAN AMERICAN: 29 mL/min — AB (ref >=60)
GFR, Est African American: 34 mL/min — ABNORMAL LOW (ref >=60)
GLUCOSE: 115 mg/dL — AB (ref 65–99)
POTASSIUM: 3.4 mmol/L — AB (ref 3.5–5.3)
SODIUM: 144 mmol/L (ref 135–146)
Total Bilirubin: 0.4 mg/dL (ref 0.2–1.2)
Total Protein: 7 g/dL (ref 6.1–8.1)

## 2016-08-20 LAB — POC MICROSCOPIC URINALYSIS (UMFC)

## 2016-08-20 LAB — POCT CBC
GRANULOCYTE PERCENT: 63.2 % (ref 37–80)
HCT, POC: 36.7 % — AB (ref 43.5–53.7)
HEMOGLOBIN: 12.8 g/dL — AB (ref 14.1–18.1)
Lymph, poc: 1.9 (ref 0.6–3.4)
MCH: 29.8 pg (ref 27–31.2)
MCHC: 35 g/dL (ref 31.8–35.4)
MCV: 85.1 fL (ref 80–97)
MID (CBC): 0.3 (ref 0–0.9)
MPV: 7.7 fL (ref 0–99.8)
PLATELET COUNT, POC: 178 10*3/uL (ref 142–424)
POC Granulocyte: 3.8 (ref 2–6.9)
POC LYMPH PERCENT: 31 %L (ref 10–50)
POC MID %: 5.8 %M (ref 0–12)
RBC: 4.31 M/uL — AB (ref 4.69–6.13)
RDW, POC: 13 %
WBC: 6 10*3/uL (ref 4.6–10.2)

## 2016-08-20 LAB — POCT URINALYSIS DIP (MANUAL ENTRY)
BILIRUBIN UA: NEGATIVE
Glucose, UA: NEGATIVE
Ketones, POC UA: NEGATIVE
LEUKOCYTES UA: NEGATIVE
NITRITE UA: NEGATIVE
PH UA: 7
Spec Grav, UA: 1.015
UROBILINOGEN UA: 1

## 2016-08-20 LAB — URINALYSIS, ROUTINE W REFLEX MICROSCOPIC
BILIRUBIN URINE: NEGATIVE
Glucose, UA: NEGATIVE mg/dL
Hgb urine dipstick: NEGATIVE
KETONES UR: NEGATIVE mg/dL
LEUKOCYTES UA: NEGATIVE
NITRITE: NEGATIVE
PH: 8 (ref 5.0–8.0)
PROTEIN: NEGATIVE mg/dL
Specific Gravity, Urine: 1.021 (ref 1.005–1.030)

## 2016-08-20 LAB — LIPASE: Lipase: 44 U/L (ref 7–60)

## 2016-08-20 MED ORDER — FENTANYL CITRATE (PF) 100 MCG/2ML IJ SOLN
50.0000 ug | Freq: Once | INTRAMUSCULAR | Status: AC
  Administered 2016-08-20: 50 ug via INTRAVENOUS
  Filled 2016-08-20: qty 2

## 2016-08-20 MED ORDER — IOPAMIDOL (ISOVUE-300) INJECTION 61%
INTRAVENOUS | Status: AC
  Administered 2016-08-20: 30 mL
  Filled 2016-08-20: qty 30

## 2016-08-20 MED ORDER — ONDANSETRON HCL 4 MG/2ML IJ SOLN
4.0000 mg | Freq: Once | INTRAMUSCULAR | Status: AC
  Administered 2016-08-20: 4 mg via INTRAVENOUS
  Filled 2016-08-20: qty 2

## 2016-08-20 MED ORDER — SODIUM CHLORIDE 0.9 % IV BOLUS (SEPSIS)
1000.0000 mL | Freq: Once | INTRAVENOUS | Status: AC
  Administered 2016-08-20: 1000 mL via INTRAVENOUS

## 2016-08-20 MED ORDER — FENTANYL CITRATE (PF) 100 MCG/2ML IJ SOLN
50.0000 ug | Freq: Once | INTRAMUSCULAR | Status: AC
  Administered 2016-08-21: 50 ug via INTRAVENOUS
  Filled 2016-08-20: qty 2

## 2016-08-20 NOTE — ED Provider Notes (Signed)
[facility] DEPT Provider Note   CSN: [id number] Arrival date & time: 08/20/16  1903     History   Chief Complaint Chief Complaint  Patient presents with  . Flank Pain    HPI Jason [name] is a 50 y.o. male.  1 week of abdominal pain, flank pain, nausea, constipation.  Was seen by PCP 2 times in past 2 days. Sent from PCP for further evaluation due to Hx and recent increase in Creatinine.  Hx of Diverticular Dx, small bowel resection due to ischemia, kidney stone X1-passed, GERD, IBS, hernia repair  Has uses several OTC products for constipation over past 3 days with 3 small  Hard BM's today without relief of discomfort.       Past Medical History:  Diagnosis Date  . Allergy   . Anemia    anemic- 11/2010, post bleeding ulcer - treated ./w   bld. transfusion   . Anxiety state, unspecified   . Arthritis    back, neck , shoulder   . Blood transfusion march 2012  . Blood transfusion without reported diagnosis   . Depression   . Diverticulitis   . Diverticulosis of colon (without mention of hemorrhage)   . Esophageal reflux   . Family history of malignant neoplasm of gastrointestinal tract   . Hernia   . Hiatal hernia   . IBS (irritable bowel syndrome)   . IBS (irritable bowel syndrome)   . Irritable bowel syndrome   . Other and unspecified hyperlipidemia   . Peptic ulcer   . Pneumonia    [facility]  . PONV (postoperative nausea and vomiting)    bowel blockage after may 2012 surgery  . Recurrent upper respiratory infection (URI)    bronchitis- 10/2011- treated /w antbx- by PCP  . Renal disorder   . 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 Unspecified essential hypertension    Dr. Stanford Breed- consulted /w pt.  (2008)due to ^ heartrate & SOB /w climbing stairs, had echo /stress- wnl. Currently PCP manages BP    . Unspecified gastritis and gastroduodenitis without mention of hemorrhage     Patient Active Problem List   Diagnosis Date  Noted  . Dyspnea 08/28/2015  . Chest pain 12/24/2012  . Duodenal ulcer, unspecified as acute or chronic, without hemorrhage, perforation, or obstruction 01/14/2011  . CONSTIPATION 07/22/2008  . RECTAL BLEEDING 07/22/2008  . ABDOMINAL PAIN, UNSPECIFIED SITE 07/22/2008  . HYPERLIPIDEMIA 11/20/2007  . ANXIETY 11/20/2007  . HYPERTENSION 11/20/2007  . GERD 11/20/2007  . IRRITABLE BOWEL SYNDROME 11/20/2007  . BACK PAIN, CHRONIC 11/20/2007  . HEADACHE, CHRONIC 11/20/2007  . PALPITATIONS 11/20/2007  . DIVERTICULOSIS, COLON 07/06/2007  . GASTRITIS 01/22/2001  . HIATAL HERNIA 01/22/2001    Past Surgical History:  Procedure Laterality Date  . APPENDECTOMY  2000  . BACK SURGERY     X3, lower  back  . BOWEL RESECTION  01/10/2012   Procedure: SMALL BOWEL RESECTION;  Surgeon: Harl Bowie, MD;  Location: [facility];  Service: General;  Laterality: N/A;  . CERVICAL FUSION   2007   c 4   . COLON SURGERY    . HERNIA REPAIR  11/01/2011   incisional hernia  . LAPAROSCOPY  11/01/2011   Procedure: LAPAROSCOPY DIAGNOSTIC;  Surgeon: Harl Bowie, MD;  Location: [facility];  Service: General;  Laterality: N/A;  . LAPAROTOMY  01/10/2012   Procedure: EXPLORATORY LAPAROTOMY;  Surgeon: Harl Bowie, MD;  Location: [facility];  Service: General;  Laterality: N/A;  . SMALL INTESTINE SURGERY  12/2011  . SPINE SURGERY    . STOMACH SURGERY  march 2012   bleeding ulcer  . UMBILICAL HERNIA REPAIR  01/2011   1998,  01/2011 & 11/01/2011       Home Medications    Prior to Admission medications   Medication Sig Start Date End Date Taking? Authorizing Provider  atorvastatin (LIPITOR) 20 MG tablet Take 1 tablet (20 mg total) by mouth daily. 07/18/16  Yes [name], MD  buPROPion (WELLBUTRIN XL) 300 MG 24 hr tablet Take 300 mg by mouth daily.   Yes Historical Provider, MD  clonazePAM (KLONOPIN) 1 MG tablet TAKE 1 TO 2 TABLETS BY MOUTH TWICE A DAY AS NEEDED FOR ANXIETY 10/23/14  Yes [name], MD    gabapentin (NEURONTIN) 300 MG capsule Take 300 mg by mouth 3 (three) times daily.   Yes Historical Provider, MD  lactulose (CEPHULAC) 20 g packet Take 1 packet (20 g total) by mouth 2 (two) times daily. 08/19/16  Yes Tereasa Coop, PA-C  metoprolol succinate (TOPROL-XL) 25 MG 24 hr tablet Take 0.5 tablets (12.5 mg total) by mouth daily. 07/18/16  Yes [name], MD  prazosin (MINIPRESS) 2 MG capsule Take 4 mg by mouth at bedtime.   Yes Historical Provider, MD  topiramate (TOPAMAX) 100 MG tablet Take 100 mg by mouth 2 (two) times daily.   Yes Historical Provider, MD  cetirizine (ZYRTEC) 10 MG tablet Take 1 tablet (10 mg total) by mouth daily. Patient not taking: Reported on 08/20/2016 09/14/15   Jaynee Eagles, PA-C  PRESCRIPTION MEDICATION Prazosin 2 mg capsules    Historical Provider, MD  sildenafil (VIAGRA) 100 MG tablet Take 0.5-1 tablets (50-100 mg total) by mouth daily as needed for erectile dysfunction. 09/14/15   Jaynee Eagles, PA-C    Family History Family History  Problem Relation Age of Onset  . Colon cancer Maternal Grandmother   . Stomach cancer Maternal Grandmother     METS  . Esophageal cancer Maternal Grandmother   . Uterine cancer Sister   . COPD Sister   . Hypertension Sister   . Mental illness Sister   . Heart disease Mother   . Clotting disorder Mother   . Stroke Mother     two strokes  . Dementia Mother   . Hypertension Mother   . Diabetes Father   . Heart disease Father   . Hypertension Father   . Pancreatic cancer Maternal Grandfather   . Leukemia Brother   . Cancer Paternal Grandmother   . Diabetes Paternal Grandmother   . Hyperlipidemia Paternal Grandmother   . Heart disease Paternal Grandfather     heart attack  . Hypertension Paternal Grandfather   . Heart disease Sister   . Anesthesia problems Neg Hx     Social History Social History  Substance Use Topics  . Smoking status: Never Smoker  . Smokeless tobacco: Never Used  . Alcohol use 0.0  oz/week     Comment: rarely, 1 or 2 a week     Allergies   Hydrocodone-acetaminophen   Review of Systems Review of Systems  Constitutional: Positive for appetite change. Negative for fever.  HENT: Negative.   Eyes: Negative.   Respiratory: Negative.   Cardiovascular: Negative.   Gastrointestinal: Positive for abdominal distention, abdominal pain, constipation and nausea. Negative for blood in stool and vomiting.  Genitourinary: Positive for flank pain.  Musculoskeletal: Positive for back pain.  Psychiatric/Behavioral: The patient is nervous/anxious.      Physical Exam Updated Vital Signs BP 134/84 (BP Location: Left Arm)   Pulse 80   Temp 98.7 F (37.1 C) (Oral)   Resp 19   Ht 5\' 11"  (1.803 m)   Wt 89.4 kg   SpO2 99%   BMI 27.48 kg/m   Physical Exam  Constitutional: He appears well-developed and well-nourished.  HENT:  Head: Normocephalic.  Eyes: Pupils are equal, round, and reactive to light.  Neck: Normal range of motion.  Cardiovascular: Normal rate and regular rhythm.   Pulmonary/Chest: Effort normal.  Abdominal: Soft. He exhibits distension. Bowel sounds are decreased. There is generalized tenderness.  Generalized pain with increase pain suprapubic and LLQ with palpation   Genitourinary: Penis normal.  Musculoskeletal: Normal range of motion.  Neurological: He is alert.  Skin: Skin is warm and dry.  Psychiatric: He has a normal mood and affect.  Nursing note and vitals reviewed.    ED Treatments / Results  Labs (all labs ordered are listed, but only abnormal results are displayed) Labs Reviewed  URINALYSIS, ROUTINE W REFLEX MICROSCOPIC (NOT AT [facility]) - Abnormal; Notable for the following:       Result Value   APPearance CLOUDY (*)    All other components within normal limits    EKG  EKG Interpretation None       Radiology Ct Abdomen Pelvis Wo Contrast  Result Date: 08/20/2016 CLINICAL DATA:  Acute onset of left flank pain, generalized  abdominal pain and mild nausea. Acute onset of renal insufficiency. Initial encounter. EXAM: CT ABDOMEN AND PELVIS WITHOUT CONTRAST TECHNIQUE: Multidetector CT imaging of the abdomen and pelvis was performed following the standard protocol without IV contrast. COMPARISON:  CT of the abdomen and pelvis performed 12/19/2015, and MRI of the lumbar spine performed 07/07/2016 FINDINGS: Lower chest: Trace bilateral pleural fluid is noted, with bibasilar atelectasis or scarring. The visualized portions of the mediastinum are unremarkable. Hepatobiliary: The liver is unremarkable in appearance. The gallbladder is unremarkable in appearance. The common bile duct remains normal in caliber. Pancreas: The pancreas is within normal limits. Spleen: The spleen is unremarkable in appearance. Adrenals/Urinary Tract: The adrenal glands are unremarkable in appearance. Mild left-sided hydronephrosis is noted, with prominence of the left ureter along its entire course. An obstructing 7 x 4 mm stone is noted at the distal left ureter, just above the left vesicoureteral junction. Trace associated free fluid is noted at the left lower quadrant. A small nonobstructing 3 mm stone is noted at the lower pole of the left kidney. No significant perinephric stranding is seen. Stomach/Bowel: The stomach is unremarkable in appearance. The small bowel is within normal limits. The patient is status post appendectomy. The colon is unremarkable in appearance. Vascular/Lymphatic: The abdominal aorta is unremarkable in appearance. The inferior vena cava is grossly unremarkable. No retroperitoneal lymphadenopathy is seen. No pelvic sidewall lymphadenopathy is identified. Reproductive: The bladder is mildly distended and grossly unremarkable. The prostate remains normal in size, with scattered calcification. Other: No additional soft tissue abnormalities are seen. Musculoskeletal: No acute osseous abnormalities are identified. The patient is status post  lumbar spinal fusion at L3-L5, with underlying decompression. The visualized musculature is unremarkable in appearance. IMPRESSION: 1. Mild left-sided hydronephrosis, with diffuse prominence of the left ureter. Obstructing 7 x 4 mm stone at the distal left ureter, just above the left vesicoureteral junction. Trace associated free fluid at the left lower quadrant. 2. Small obstructing 3 mm stone at the  lower pole of the left kidney. 3. Trace bilateral pleural fluid, with bibasilar atelectasis or scarring. Electronically Signed   By: Garald Balding M.D.   On: 08/20/2016 22:36   Dg Abd 2 Views  Result Date: 08/19/2016 CLINICAL DATA:  No bowel movement for 2 weeks. History of constipation and small bowel resection 2013. EXAM: ABDOMEN - 2 VIEW COMPARISON:  Abdominal radiograph March 28, 2016 FINDINGS: The bowel gas pattern is normal. Gas-filled nondistended small and large bowel without significant retained large bowel stool. There is no evidence of free air. No radio-opaque calculi or other significant radiographic abnormality is seen. L5-S1 laminectomy, L4-5 PLIF. IMPRESSION: Normal bowel gas pattern. No significant retained large bowel stool. Electronically Signed   By: Elon Alas M.D.   On: 08/19/2016 18:37    Procedures Procedures (including critical care time)  Medications Ordered in ED Medications  sodium chloride 0.9 % bolus 1,000 mL (0 mLs Intravenous Stopped 08/20/16 2344)  fentaNYL (SUBLIMAZE) injection 50 mcg (50 mcg Intravenous Given 08/20/16 2041)  ondansetron (ZOFRAN) injection 4 mg (4 mg Intravenous Given 08/20/16 2041)  iopamidol (ISOVUE-300) 61 % injection (30 mLs  Contrast Given 08/20/16 2217)  fentaNYL (SUBLIMAZE) injection 50 mcg (50 mcg Intravenous Given 08/21/16 0012)     Initial Impression / Assessment and Plan / ED Course  I have reviewed the triage vital signs and the nursing notes.  Pertinent labs & imaging results that were available during my care of the patient  were reviewed by me and considered in my medical decision making (see chart for details).  Clinical Course      Labs reviewed due to increased Creatitine will obtain CT abdomen without contrast due to Hx of both kidney stone and bowel resection, Diverticular Dx and IBS CT scan shows patient is a 7 x 4 mm obstructing stone with hydronephrosis and dilatation of the ureter.  I did speak with Dr. Manny/urology.  He will admit the patient for surgical extraction tomorrow afternoon.  Patient is to be kept nothing by mouth he will write admission orders.  Final Clinical Impressions(s) / ED Diagnoses   Final diagnoses:  Kidney stone  Acute renal insufficiency    New Prescriptions New Prescriptions   No medications on file     Junius Creamer, NP 08/21/16 0018    Orlie Dakin, MD 08/21/16 [id number]

## 2016-08-20 NOTE — ED Triage Notes (Signed)
Pt sent from [facility] urgent care due to possible obstructing kidney stone. Urgent care PA reports pt's creatine was elevated today compared to previous visit. Pt complains of diffuse abdominal pain.

## 2016-08-20 NOTE — ED Notes (Signed)
Bed: [id number] Expected date:  Expected time:  Means of arrival:  Comments: Hold for triage 2

## 2016-08-20 NOTE — Progress Notes (Signed)
Patient called by me and LVOM to come back to clinic or go to ED given precipitous increase in creatinine.

## 2016-08-20 NOTE — Progress Notes (Signed)
MRN: [id number] DOB: [birth year]  Subjective:   Jason [name] is a 50 y.o. male presenting for Follow-up (dehydration, creatinine)  Patient was last seen yesterday, 08/19/2016, started on lactulose. He was also given IV fluids for rehydration. X-ray and cbc was reassuring. However, labs showed impaired kidney function, GFR of 29, increase in cr 2.48. Today, patient reports having a bowel movement. Stool was loose. Has had a total of 3. Still has abdominal pain, left flank pain, some mild nausea. Has been drinking water, but urinating very little, has cloudy appearance. Reports history of renal stones.   Jason [name] has a current medication list which includes the following prescription(s): atorvastatin, bupropion, cetirizine, clonazepam, gabapentin, lactulose, metoprolol succinate, PRESCRIPTION MEDICATION, sildenafil, and topiramate. Also is allergic to hydrocodone-acetaminophen.  [name]  has a past medical history of Allergy; Anemia; Anxiety state, unspecified; Arthritis; Blood transfusion (march 2012); Blood transfusion without reported diagnosis; Depression; Diverticulitis; Diverticulosis of colon (without mention of hemorrhage); Esophageal reflux; Family history of malignant neoplasm of gastrointestinal tract; Hernia; Hiatal hernia; IBS (irritable bowel syndrome); IBS (irritable bowel syndrome); Irritable bowel syndrome; Other and unspecified hyperlipidemia; Peptic ulcer; Pneumonia; PONV (postoperative nausea and vomiting); Recurrent upper respiratory infection (URI); Spondylitis, 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 Also  has a past surgical history that includes Umbilical hernia repair (01/2011); Cervical fusion ( 2007); Stomach surgery (march 2012); Back surgery; Appendectomy (2000); laparoscopy (11/01/2011); laparotomy (01/10/2012); Bowel resection (01/10/2012); Small intestine surgery (12/2011); Hernia repair  (11/01/2011); Colon surgery; and Spine surgery.   Objective:   Vitals: BP (!) 148/72 (BP Location: Right Arm, Patient Position: Sitting, Cuff Size: Normal)   Pulse (!) 105   Temp 98.6 F (37 C)   Resp 16   Ht 5\' 11"  ([id number] m)   Wt 197 lb (89.4 kg)   SpO2 99%   BMI 27.48 kg/m   Physical Exam  Constitutional: He is oriented to person, place, and time. He appears well-developed and well-nourished.  HENT:  Mouth/Throat: Oropharynx is clear and moist.  Cardiovascular: Normal rate, regular rhythm and intact distal pulses.  Exam reveals no gallop and no friction rub.   No murmur heard. Pulmonary/Chest: No respiratory distress. He has no wheezes. He has no rales.  Abdominal: Soft. Bowel sounds are normal. He exhibits no distension and no mass. There is tenderness (diffuse, upper > lower). There is no guarding.  Left CVA tenderness.  Neurological: He is alert and oriented to person, place, and time.  Skin: Skin is warm and dry.    Results for orders placed or performed in visit on 08/20/16 (from the past 24 hour(s))  POCT urinalysis dipstick     Status: Abnormal   Collection Time: 08/20/16  6:38 PM  Result Value Ref Range   Color, UA yellow yellow   Clarity, UA cloudy (A) clear   Glucose, UA negative negative   Bilirubin, UA negative negative   Ketones, POC UA negative negative   Spec Grav, UA 1.015    Blood, UA trace-lysed (A) negative   pH, UA 7.0    Protein Ur, POC =30 (A) negative   Urobilinogen, UA 1.0    Nitrite, UA Negative Negative   Leukocytes, UA Negative Negative  POCT CBC     Status: Abnormal   Collection Time: 08/20/16  6:39 PM  Result Value Ref Range   WBC 6.0 4.6 - 10.2 K/uL   Lymph, poc 1.9 0.6 - 3.4   POC LYMPH PERCENT 31.0 10 -  50 %L   MID (cbc) 0.3 0 - 0.9   POC MID % 5.8 0 - 12 %M   POC Granulocyte 3.8 2 - 6.9   Granulocyte percent 63.2 37 - 80 %G   RBC 4.31 (A) 4.69 - 6.13 M/uL   Hemoglobin 12.8 (A) 14.1 - 18.1 g/dL   HCT, POC 36.7 (A) 43.5 - 53.7 %     MCV 85.1 80 - 97 fL   MCH, POC 29.8 27 - 31.2 pg   MCHC 35.0 31.8 - 35.4 g/dL   RDW, POC 13.0 %   Platelet Count, POC 178 142 - 424 K/uL   MPV 7.7 0 - 99.8 fL   Assessment and Plan :   1. Generalized abdominal pain 2. Elevated serum creatinine 3. Left flank pain - Given history of renal stones, last seen 11/2015, will send to [facility] ED to rule out obstructing stone. Case reported out to [employer] who verbalized understanding.  4. Constipation, unspecified constipation type 5. Dehydration - Improved, advised continued hydration, use lactulose as needed.  Jaynee Eagles, PA-C [facility] [phone] 08/20/2016 6:15 PM

## 2016-08-20 NOTE — ED Notes (Signed)
Pt is c/o 9/10 Left flank pain that has radiated to middle of abdomen and back. Pt describes the pain as stabbing. Pt states that he had been taking Tramadol in which has been helping to decrease the pain however not completely taking the pain away. Pt states that he was constipated x 1 week and has resolved with laxative and reports three small BM today. Pt states that he had N/V yesterday. But has not had an episodes today. Pt states that he has had a poor appetite, and was taking phenergan. Pt states that his urine is cloudy, andappears that is a little pink in color, pt reports  that he does have burning with urination.

## 2016-08-20 NOTE — ED Triage Notes (Signed)
Per pt, states left flank pain since yesterday-states was seen at [facility] to come to ED for further eval

## 2016-08-20 NOTE — Patient Instructions (Addendum)
  [name]  Address: [address], [place], [place] [address] Phone: [phone]  Please report to the [name] ED now. It is my clinical opinion that you will need an abdominal CT scan to rule out an obstructing kidney stone, given your elevated creatinine level, decreased GFR and abdominal pain. An physician or physician assistant will evaluate you there and will potentially order more studies to rule out other possibilities.   Abdominal Pain, Adult Abdominal pain can be caused by many things. Often, abdominal pain is not serious and it gets better with no treatment or by being treated at home. However, sometimes abdominal pain is serious. Your health care provider will do a medical history and a physical exam to try to determine the cause of your abdominal pain. Follow these instructions at home:  Take over-the-counter and prescription medicines only as told by your health care provider. Do not take a laxative unless told by your health care provider.  Drink enough fluid to keep your urine clear or pale yellow.  Watch your condition for any changes.  Keep all follow-up visits as told by your health care provider. This is important. Contact a health care provider if:  Your abdominal pain changes or gets worse.  You are not hungry or you lose weight without trying.  You are constipated or have diarrhea for more than 2-3 days.  You have pain when you urinate or have a bowel movement.  Your abdominal pain wakes you up at night.  Your pain gets worse with meals, after eating, or with certain foods.  You are throwing up and cannot keep anything down.  You have a fever. Get help right away if:  Your pain does not go away as soon as your health care provider told you to expect.  You cannot stop throwing up.  Your pain is only in areas of the abdomen, such as the right side or the left lower portion of the abdomen.  You have bloody or black stools, or stools that  look like tar.  You have severe pain, cramping, or bloating in your abdomen.  You have signs of dehydration, such as:  Dark urine, very little urine, or no urine.  Cracked lips.  Dry mouth.  Sunken eyes.  Sleepiness.  Weakness. This information is not intended to replace advice given to you by your health care provider. Make sure you discuss any questions you have with your health care provider. Document Released: 06/26/2005 Document Revised: 04/05/2016 Document Reviewed: 02/28/2016 Elsevier Interactive Patient Education  2017 [employer].     IF you received an x-ray today, you will receive an invoice from [facility]. Please contact [facility] at [phone] with questions or concerns regarding your invoice.   IF you received labwork today, you will receive an invoice from [employer]. Please contact [facility] at [phone] with questions or concerns regarding your invoice.   Our billing staff will not be able to assist you with questions regarding bills from these companies.  You will be contacted with the lab results as soon as they are available. The fastest way to get your results is to activate your My Chart account. Instructions are located on the last page of this paperwork. If you have not heard from [place] regarding the results in 2 weeks, please contact this office.

## 2016-08-21 ENCOUNTER — Encounter (HOSPITAL_COMMUNITY): Payer: Self-pay | Admitting: *Deleted

## 2016-08-21 ENCOUNTER — Observation Stay (HOSPITAL_COMMUNITY): Payer: BLUE CROSS/BLUE SHIELD | Admitting: Certified Registered"

## 2016-08-21 ENCOUNTER — Encounter (HOSPITAL_COMMUNITY)
Admission: EM | Disposition: A | Discharge status: Home / Self Care | Payer: Self-pay | Source: Home / Self Care | Attending: Emergency Medicine

## 2016-08-21 DIAGNOSIS — N201 Calculus of ureter: Secondary | ICD-10-CM | POA: Diagnosis present

## 2016-08-21 HISTORY — PX: CYSTOSCOPY/URETEROSCOPY/HOLMIUM LASER/STENT PLACEMENT: SHX6546

## 2016-08-21 LAB — BASIC METABOLIC PANEL
ANION GAP: 5 (ref 5–15)
BUN: 25 mg/dL — ABNORMAL HIGH (ref 6–20)
CALCIUM: 8.2 mg/dL — AB (ref 8.9–10.3)
CO2: 20 mmol/L — ABNORMAL LOW (ref 22–32)
Chloride: 116 mmol/L — ABNORMAL HIGH (ref 101–111)
Creatinine, Ser: 2.13 mg/dL — ABNORMAL HIGH (ref 0.61–1.24)
GFR calc Af Amer: 40 mL/min — ABNORMAL LOW (ref >60)
GFR, EST NON AFRICAN AMERICAN: 35 mL/min — AB (ref >60)
GLUCOSE: 92 mg/dL (ref 65–99)
Potassium: 3.6 mmol/L (ref 3.5–5.1)
SODIUM: 141 mmol/L (ref 135–145)

## 2016-08-21 LAB — MRSA PCR SCREENING: MRSA BY PCR: NEGATIVE

## 2016-08-21 SURGERY — CYSTOSCOPY/URETEROSCOPY/HOLMIUM LASER/STENT PLACEMENT
Anesthesia: General | Site: Urethra | Laterality: Left

## 2016-08-21 MED ORDER — 0.9 % SODIUM CHLORIDE (POUR BTL) OPTIME
TOPICAL | Status: DC | PRN
  Administered 2016-08-21: 1000 mL

## 2016-08-21 MED ORDER — SODIUM CHLORIDE 0.9 % IV SOLN
INTRAVENOUS | Status: DC
  Administered 2016-08-21 (×3): via INTRAVENOUS

## 2016-08-21 MED ORDER — FENTANYL CITRATE (PF) 100 MCG/2ML IJ SOLN
25.0000 ug | INTRAMUSCULAR | Status: DC | PRN
  Administered 2016-08-21 (×3): 50 ug via INTRAVENOUS

## 2016-08-21 MED ORDER — SENNOSIDES-DOCUSATE SODIUM 8.6-50 MG PO TABS
1.0000 | ORAL_TABLET | Freq: Two times a day (BID) | ORAL | Status: DC
  Administered 2016-08-21 (×3): 1 via ORAL
  Filled 2016-08-21 (×3): qty 1

## 2016-08-21 MED ORDER — LACTATED RINGERS IV SOLN
INTRAVENOUS | Status: DC | PRN
  Administered 2016-08-21: 19:00:00 via INTRAVENOUS

## 2016-08-21 MED ORDER — IOPAMIDOL (ISOVUE-300) INJECTION 61%
INTRAVENOUS | Status: AC
  Filled 2016-08-21: qty 50

## 2016-08-21 MED ORDER — FENTANYL CITRATE (PF) 100 MCG/2ML IJ SOLN
INTRAMUSCULAR | Status: AC
Start: 2016-08-21 — End: 2016-08-21
  Filled 2016-08-21: qty 2

## 2016-08-21 MED ORDER — SODIUM CHLORIDE 0.9 % IR SOLN
Status: DC | PRN
  Administered 2016-08-21: 4000 mL

## 2016-08-21 MED ORDER — ONDANSETRON HCL 4 MG/2ML IJ SOLN
INTRAMUSCULAR | Status: DC | PRN
  Administered 2016-08-21: 4 mg via INTRAVENOUS

## 2016-08-21 MED ORDER — SENNOSIDES-DOCUSATE SODIUM 8.6-50 MG PO TABS: 1.0000 | ORAL_TABLET | Freq: Two times a day (BID) | ORAL | 0 refills | Status: DC

## 2016-08-21 MED ORDER — HYDROMORPHONE HCL 1 MG/ML IJ SOLN
0.5000 mg | INTRAMUSCULAR | Status: DC | PRN
  Administered 2016-08-21 (×7): 1 mg via INTRAVENOUS
  Filled 2016-08-21 (×7): qty 1

## 2016-08-21 MED ORDER — FENTANYL CITRATE (PF) 100 MCG/2ML IJ SOLN
INTRAMUSCULAR | Status: AC
  Filled 2016-08-21: qty 2

## 2016-08-21 MED ORDER — ACETAMINOPHEN 500 MG PO TABS
1000.0000 mg | ORAL_TABLET | Freq: Three times a day (TID) | ORAL | Status: DC
  Administered 2016-08-21 (×4): 1000 mg via ORAL
  Filled 2016-08-21 (×4): qty 2

## 2016-08-21 MED ORDER — ATORVASTATIN CALCIUM 20 MG PO TABS: 20.0000 mg | ORAL_TABLET | Freq: Every day | ORAL | Status: DC

## 2016-08-21 MED ORDER — CLONAZEPAM 1 MG PO TABS
1.0000 mg | ORAL_TABLET | Freq: Two times a day (BID) | ORAL | Status: DC | PRN
  Administered 2016-08-21 (×2): 1 mg via ORAL
  Filled 2016-08-21 (×2): qty 1

## 2016-08-21 MED ORDER — DEXAMETHASONE SODIUM PHOSPHATE 10 MG/ML IJ SOLN
INTRAMUSCULAR | Status: DC | PRN
  Administered 2016-08-21: 10 mg via INTRAVENOUS

## 2016-08-21 MED ORDER — SODIUM CHLORIDE 0.9 % IV SOLN
INTRAVENOUS | Status: DC | PRN
  Administered 2016-08-21: 25 mL

## 2016-08-21 MED ORDER — ONDANSETRON HCL 4 MG/2ML IJ SOLN
INTRAMUSCULAR | Status: AC
  Filled 2016-08-21: qty 2

## 2016-08-21 MED ORDER — DEXTROSE 5 % IV SOLN
1.0000 g | Freq: Once | INTRAVENOUS | Status: AC
  Administered 2016-08-21: 1 g via INTRAVENOUS
  Filled 2016-08-21: qty 10

## 2016-08-21 MED ORDER — SUCCINYLCHOLINE CHLORIDE 200 MG/10ML IV SOSY
PREFILLED_SYRINGE | INTRAVENOUS | Status: AC
  Filled 2016-08-21: qty 10

## 2016-08-21 MED ORDER — SUCCINYLCHOLINE CHLORIDE 200 MG/10ML IV SOSY
PREFILLED_SYRINGE | INTRAVENOUS | Status: DC | PRN
  Administered 2016-08-21: 140 mg via INTRAVENOUS

## 2016-08-21 MED ORDER — PRAZOSIN HCL 2 MG PO CAPS
4.0000 mg | ORAL_CAPSULE | Freq: Every day | ORAL | Status: DC
  Administered 2016-08-21: 4 mg via ORAL
  Filled 2016-08-21 (×2): qty 2

## 2016-08-21 MED ORDER — LIDOCAINE 2% (20 MG/ML) 5 ML SYRINGE
INTRAMUSCULAR | Status: DC | PRN
Start: 2016-08-21 — End: 2016-08-21
  Administered 2016-08-21: 100 mg via INTRAVENOUS

## 2016-08-21 MED ORDER — FENTANYL CITRATE (PF) 100 MCG/2ML IJ SOLN
INTRAMUSCULAR | Status: DC | PRN
  Administered 2016-08-21 (×2): 50 ug via INTRAVENOUS

## 2016-08-21 MED ORDER — OXYCODONE-ACETAMINOPHEN 5-325 MG PO TABS: 1.0000 | ORAL_TABLET | Freq: Four times a day (QID) | ORAL | 0 refills | Status: DC | PRN

## 2016-08-21 MED ORDER — MIDAZOLAM HCL 5 MG/5ML IJ SOLN
INTRAMUSCULAR | Status: DC | PRN
  Administered 2016-08-21: 2 mg via INTRAVENOUS

## 2016-08-21 MED ORDER — METOPROLOL SUCCINATE ER 25 MG PO TB24
12.5000 mg | ORAL_TABLET | Freq: Every day | ORAL | Status: DC
  Administered 2016-08-21: 12.5 mg via ORAL
  Filled 2016-08-21: qty 1

## 2016-08-21 MED ORDER — DEXAMETHASONE SODIUM PHOSPHATE 10 MG/ML IJ SOLN
INTRAMUSCULAR | Status: AC
  Filled 2016-08-21: qty 1

## 2016-08-21 MED ORDER — ONDANSETRON HCL 4 MG/2ML IJ SOLN
4.0000 mg | INTRAMUSCULAR | Status: DC | PRN
  Administered 2016-08-21 (×2): 4 mg via INTRAVENOUS
  Filled 2016-08-21 (×2): qty 2

## 2016-08-21 MED ORDER — CEPHALEXIN 500 MG PO CAPS: 500.0000 mg | ORAL_CAPSULE | Freq: Two times a day (BID) | ORAL | 0 refills | Status: DC

## 2016-08-21 MED ORDER — OXYCODONE HCL 5 MG PO TABS: 5.0000 mg | ORAL_TABLET | ORAL | Status: DC | PRN

## 2016-08-21 MED ORDER — ONDANSETRON HCL 4 MG/2ML IJ SOLN: 4.0000 mg | Freq: Once | INTRAMUSCULAR | Status: DC | PRN

## 2016-08-21 MED ORDER — LACTATED RINGERS IV SOLN: INTRAVENOUS | Status: DC

## 2016-08-21 MED ORDER — LIDOCAINE 2% (20 MG/ML) 5 ML SYRINGE
INTRAMUSCULAR | Status: AC
  Filled 2016-08-21: qty 5

## 2016-08-21 MED ORDER — MIDAZOLAM HCL 2 MG/2ML IJ SOLN
INTRAMUSCULAR | Status: AC
  Filled 2016-08-21: qty 2

## 2016-08-21 MED ORDER — PROPOFOL 10 MG/ML IV BOLUS
INTRAVENOUS | Status: DC | PRN
  Administered 2016-08-21: 180 mg via INTRAVENOUS

## 2016-08-21 MED ORDER — PROPOFOL 10 MG/ML IV BOLUS
INTRAVENOUS | Status: AC
  Filled 2016-08-21: qty 20

## 2016-08-21 SURGICAL SUPPLY — 22 items
BAG URO CATCHER STRL LF (MISCELLANEOUS) ×3 IMPLANT
BASKET LASER NITINOL 1.9FR (BASKET) ×2 IMPLANT
BSKT STON RTRVL 120 1.9FR (BASKET) ×1
CATH INTERMIT  6FR 70CM (CATHETERS) ×3 IMPLANT
CLOTH BEACON ORANGE TIMEOUT ST (SAFETY) ×3 IMPLANT
FIBER LASER FLEXIVA 1000 (UROLOGICAL SUPPLIES) IMPLANT
FIBER LASER FLEXIVA 365 (UROLOGICAL SUPPLIES) IMPLANT
FIBER LASER FLEXIVA 550 (UROLOGICAL SUPPLIES) IMPLANT
FIBER LASER TRAC TIP (UROLOGICAL SUPPLIES) ×2 IMPLANT
GLOVE BIOGEL M STRL SZ7.5 (GLOVE) ×3 IMPLANT
GOWN STRL REUS W/TWL LRG LVL3 (GOWN DISPOSABLE) ×6 IMPLANT
GUIDEWIRE ANG ZIPWIRE 038X150 (WIRE) ×3 IMPLANT
GUIDEWIRE STR DUAL SENSOR (WIRE) ×3 IMPLANT
IV NS 1000ML (IV SOLUTION) ×3
IV NS 1000ML BAXH (IV SOLUTION) ×1 IMPLANT
MANIFOLD NEPTUNE II (INSTRUMENTS) ×3 IMPLANT
PACK CYSTO (CUSTOM PROCEDURE TRAY) ×3 IMPLANT
SHEATH ACCESS URETERAL 38CM (SHEATH) ×2 IMPLANT
SYR CONTROL 10ML LL (SYRINGE) ×3 IMPLANT
TUBE FEEDING 8FR 16IN STR KANG (MISCELLANEOUS) ×3 IMPLANT
TUBING CONNECTING 10 (TUBING) ×2 IMPLANT
TUBING CONNECTING 10' (TUBING) ×1

## 2016-08-21 NOTE — Anesthesia Procedure Notes (Signed)
Procedure Name: Intubation Date/Time: 08/21/2016 6:47 PM Performed by: Noralyn Pick D Pre-anesthesia Checklist: Patient identified, Emergency Drugs available, Suction available and Patient being monitored Patient Re-evaluated:Patient Re-evaluated prior to inductionOxygen Delivery Method: Circle system utilized Preoxygenation: Pre-oxygenation with 100% oxygen Intubation Type: IV induction Ventilation: Mask ventilation without difficulty Laryngoscope Size: Mac and 4 Grade View: Grade II Tube type: Oral Tube size: 7.5 mm Number of attempts: 1 Airway Equipment and Method: Stylet Placement Confirmation: ETT inserted through vocal cords under direct vision,  positive ETCO2 and breath sounds checked- equal and bilateral Tube secured with: Tape Dental Injury: Teeth and Oropharynx as per pre-operative assessment

## 2016-08-21 NOTE — Anesthesia Preprocedure Evaluation (Signed)
Anesthesia Evaluation  Patient identified by MRN, date of birth, ID band Patient awake    Reviewed: Allergy & Precautions, NPO status , Patient's Chart, lab work & pertinent test results  Airway Mallampati: II  TM Distance: >3 FB Neck ROM: Full    Dental  (+) Teeth Intact, Dental Advisory Given   Pulmonary    breath sounds clear to auscultation       Cardiovascular hypertension,  Rhythm:Regular Rate:Normal     Neuro/Psych    GI/Hepatic   Endo/Other    Renal/GU      Musculoskeletal   Abdominal   Peds  Hematology   Anesthesia Other Findings   Reproductive/Obstetrics                             Anesthesia Physical Anesthesia Plan  ASA: III  Anesthesia Plan: General   Post-op Pain Management:    Induction: Intravenous  Airway Management Planned: Oral ETT  Additional Equipment:   Intra-op Plan:   Post-operative Plan: Extubation in OR  Informed Consent: I have reviewed the patients History and Physical, chart, labs and discussed the procedure including the risks, benefits and alternatives for the proposed anesthesia with the patient or authorized representative who has indicated his/her understanding and acceptance.   Dental advisory given  Plan Discussed with: CRNA and Anesthesiologist  Anesthesia Plan Comments:         Anesthesia Quick Evaluation  

## 2016-08-21 NOTE — H&P (Signed)
Jason [name] is an 50 y.o. male.    Chief Complaint: Left Ureteral Stone / Recurrent Nephrolithiasis, Acute Renal Failure  HPI:   1 - Left Ureteral Stone / Recurrent Nephrolithiasis -  Prior - Medical passage x 1 11/2015  07/2016 - left 33mm distal fusiform ureteral stone with moderate hydro by ER CT on eval abdominal pain. Stone is 52mm, SSD 10cm, 400HU. Ipsilateral 29mm lower-mid renal stone only additional. UA without infectious parameters. He takes topomax and has h/o irritable bowel disease.   2 - Acute Renal Failure - Cr 2.5 by PCP labs 11/20 up from baseline <1.5. Admits to poor PO intake. Unilateral ureteral stone as per above.  PMH sig for back/neck surgery x 3 with some mild LE weakness, bowel resection for ischemia, abd hernia repair x3. NO CV dissease / blood thinners. His PCP is Janeann Forehand MD.  Today "Jason [name]" is seen for urgent admission for above.     Past Medical History:  Diagnosis Date  . Allergy   . Anemia    anemic- 11/2010, post bleeding ulcer - treated ./w   bld. transfusion   . Anxiety state, unspecified   . Arthritis    back, neck , shoulder   . Blood transfusion march 2012  . Blood transfusion without reported diagnosis   . Depression   . Diverticulitis   . Diverticulosis of colon (without mention of hemorrhage)   . Esophageal reflux   . Family history of malignant neoplasm of gastrointestinal tract   . Hernia   . Hiatal hernia   . IBS (irritable bowel syndrome)   . IBS (irritable bowel syndrome)   . Irritable bowel syndrome   . Other and unspecified hyperlipidemia   . Peptic ulcer   . Pneumonia    [facility]  . PONV (postoperative nausea and vomiting)    bowel blockage after may 2012 surgery  . Recurrent upper respiratory infection (URI)    bronchitis- 10/2011- treated /w antbx- by PCP  . Renal disorder   . 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 Unspecified essential hypertension    Dr. Stanford Breed- consulted /w pt.   (2008)due to ^ heartrate & SOB /w climbing stairs, had echo /stress- wnl. Currently PCP manages BP    . Unspecified gastritis and gastroduodenitis without mention of hemorrhage     Past Surgical History:  Procedure Laterality Date  . APPENDECTOMY  2000  . BACK SURGERY     X3, lower  back  . BOWEL RESECTION  01/10/2012   Procedure: SMALL BOWEL RESECTION;  Surgeon: Harl Bowie, MD;  Location: [place];  Service: General;  Laterality: N/A;  . CERVICAL FUSION   2007   c 4   . COLON SURGERY    . HERNIA REPAIR  11/01/2011   incisional hernia  . LAPAROSCOPY  11/01/2011   Procedure: LAPAROSCOPY DIAGNOSTIC;  Surgeon: Harl Bowie, MD;  Location: [facility];  Service: General;  Laterality: N/A;  . LAPAROTOMY  01/10/2012   Procedure: EXPLORATORY LAPAROTOMY;  Surgeon: Harl Bowie, MD;  Location: [place];  Service: General;  Laterality: N/A;  . SMALL INTESTINE SURGERY  12/2011  . SPINE SURGERY    . STOMACH SURGERY  march 2012   bleeding ulcer  . UMBILICAL HERNIA REPAIR  01/2011   1998,  01/2011 & 11/01/2011    Family History  Problem Relation Age of Onset  . Colon cancer Maternal Grandmother   . Stomach cancer Maternal  Grandmother     METS  . Esophageal cancer Maternal Grandmother   . Uterine cancer Sister   . COPD Sister   . Hypertension Sister   . Mental illness Sister   . Heart disease Mother   . Clotting disorder Mother   . Stroke Mother     two strokes  . Dementia Mother   . Hypertension Mother   . Diabetes Father   . Heart disease Father   . Hypertension Father   . Pancreatic cancer Maternal Grandfather   . Leukemia Brother   . Cancer Paternal Grandmother   . Diabetes Paternal Grandmother   . Hyperlipidemia Paternal Grandmother   . Heart disease Paternal Grandfather     heart attack  . Hypertension Paternal Grandfather   . Heart disease Sister   . Anesthesia problems Neg Hx    Social History:  reports that he has never smoked. He has never used smokeless tobacco. He  reports that he drinks alcohol. He reports that he does not use drugs.  Allergies:  Allergies  Allergen Reactions  . Hydrocodone-Acetaminophen Palpitations    Increased heart rate     (Not in a hospital admission)  Results for orders placed or performed during the hospital encounter of 08/20/16 (from the past 48 hour(s))  Urinalysis, Routine w reflex microscopic- may I&O cath if menses     Status: Abnormal   Collection Time: 08/20/16  7:13 PM  Result Value Ref Range   Color, Urine YELLOW YELLOW   APPearance CLOUDY (A) CLEAR   Specific Gravity, Urine 1.021 1.005 - 1.030   pH 8.0 5.0 - 8.0   Glucose, UA NEGATIVE NEGATIVE mg/dL   Hgb urine dipstick NEGATIVE NEGATIVE   Bilirubin Urine NEGATIVE NEGATIVE   Ketones, ur NEGATIVE NEGATIVE mg/dL   Protein, ur NEGATIVE NEGATIVE mg/dL   Nitrite NEGATIVE NEGATIVE   Leukocytes, UA NEGATIVE NEGATIVE    Comment: MICROSCOPIC NOT DONE ON URINES WITH NEGATIVE PROTEIN, BLOOD, LEUKOCYTES, NITRITE, OR GLUCOSE <1000 mg/dL.   Ct Abdomen Pelvis Wo Contrast  Result Date: 08/20/2016 CLINICAL DATA:  Acute onset of left flank pain, generalized abdominal pain and mild nausea. Acute onset of renal insufficiency. Initial encounter. EXAM: CT ABDOMEN AND PELVIS WITHOUT CONTRAST TECHNIQUE: Multidetector CT imaging of the abdomen and pelvis was performed following the standard protocol without IV contrast. COMPARISON:  CT of the abdomen and pelvis performed 12/19/2015, and MRI of the lumbar spine performed 07/07/2016 FINDINGS: Lower chest: Trace bilateral pleural fluid is noted, with bibasilar atelectasis or scarring. The visualized portions of the mediastinum are unremarkable. Hepatobiliary: The liver is unremarkable in appearance. The gallbladder is unremarkable in appearance. The common bile duct remains normal in caliber. Pancreas: The pancreas is within normal limits. Spleen: The spleen is unremarkable in appearance. Adrenals/Urinary Tract: The adrenal glands are  unremarkable in appearance. Mild left-sided hydronephrosis is noted, with prominence of the left ureter along its entire course. An obstructing 7 x 4 mm stone is noted at the distal left ureter, just above the left vesicoureteral junction. Trace associated free fluid is noted at the left lower quadrant. A small nonobstructing 3 mm stone is noted at the lower pole of the left kidney. No significant perinephric stranding is seen. Stomach/Bowel: The stomach is unremarkable in appearance. The small bowel is within normal limits. The patient is status post appendectomy. The colon is unremarkable in appearance. Vascular/Lymphatic: The abdominal aorta is unremarkable in appearance. The inferior vena cava is grossly unremarkable. No retroperitoneal lymphadenopathy is seen. No  pelvic sidewall lymphadenopathy is identified. Reproductive: The bladder is mildly distended and grossly unremarkable. The prostate remains normal in size, with scattered calcification. Other: No additional soft tissue abnormalities are seen. Musculoskeletal: No acute osseous abnormalities are identified. The patient is status post lumbar spinal fusion at L3-L5, with underlying decompression. The visualized musculature is unremarkable in appearance. IMPRESSION: 1. Mild left-sided hydronephrosis, with diffuse prominence of the left ureter. Obstructing 7 x 4 mm stone at the distal left ureter, just above the left vesicoureteral junction. Trace associated free fluid at the left lower quadrant. 2. Small obstructing 3 mm stone at the lower pole of the left kidney. 3. Trace bilateral pleural fluid, with bibasilar atelectasis or scarring. Electronically Signed   By: Garald Balding M.D.   On: 08/20/2016 22:36   Dg Abd 2 Views  Result Date: 08/19/2016 CLINICAL DATA:  No bowel movement for 2 weeks. History of constipation and small bowel resection 2013. EXAM: ABDOMEN - 2 VIEW COMPARISON:  Abdominal radiograph March 28, 2016 FINDINGS: The bowel gas pattern is  normal. Gas-filled nondistended small and large bowel without significant retained large bowel stool. There is no evidence of free air. No radio-opaque calculi or other significant radiographic abnormality is seen. L5-S1 laminectomy, L4-5 PLIF. IMPRESSION: Normal bowel gas pattern. No significant retained large bowel stool. Electronically Signed   By: Elon Alas M.D.   On: 08/19/2016 18:37    Review of Systems  Constitutional: Negative.  Negative for chills and fever.  HENT: Negative.   Eyes: Negative.   Respiratory: Negative.   Cardiovascular: Negative.   Gastrointestinal: Negative.   Genitourinary: Negative.   Musculoskeletal: Negative.   Skin: Negative.   Neurological: Negative.   Endo/Heme/Allergies: Negative.   Psychiatric/Behavioral: Negative.     Blood pressure 134/84, pulse 80, temperature 98.7 F (37.1 C), temperature source Oral, resp. rate 19, height 5\' 11"  (1.803 m), weight 89.4 kg (197 lb), SpO2 99 %. Physical Exam  Constitutional: He appears well-developed.  HENT:  Head: Normocephalic.  Eyes: Pupils are equal, round, and reactive to light.  Neck: Normal range of motion.  Cardiovascular: Normal rate.   Respiratory: Effort normal.  GI: Soft.  Prior scars w/o hernias.   Genitourinary:  Genitourinary Comments: Mild LEFT CVAT  Musculoskeletal: Normal range of motion.  Neurological: He is alert.  Skin: Skin is warm.  Psychiatric: He has a normal mood and affect. His behavior is normal.     Assessment/Plan   1 - Left Ureteral Stone / Recurrent Nephrolithiasis - discussed management options for current stone including medical therapy (<50% chance passage given size) v. SWL (not preferred given distal location and multifocal) v. Ureteroscopy this admission and he opts for the latter. Risks, benefits, alternatives, expected peri-op course with need for post-op ureteral stent discussed. His topomax use and inflammatory bowel disease increase his stone risk.  NPO,  pain meds, IVF, add on for afternoon.   2 - Acute Renal Failure - likely pre-renal dehydration exacerbated by some unilateral obstruction. IV hydrationan renal decompression as per above.    Alexis Frock, MD 08/21/2016, 12:21 AM

## 2016-08-21 NOTE — Brief Op Note (Signed)
08/20/2016 - 08/21/2016  7:24 PM  PATIENT:  Jason [name]  50 y.o. male  PRE-OPERATIVE DIAGNOSIS:  left ureteral stone  POST-OPERATIVE DIAGNOSIS:  left ureteral stone  PROCEDURE:  Procedure(s): CYSTOSCOPY/LEFT RETROGRADE PYELOGRAM/LEFT URETEROSCOPY/HOLMIUM LASER/LEFT STENT PLACEMENT (Left)  SURGEON:  Surgeon(s) and Role:    * Alexis Frock, MD - Primary  PHYSICIAN ASSISTANT:   ASSISTANTS: none   ANESTHESIA:   general  EBL:  No intake/output data recorded.  BLOOD ADMINISTERED:none  DRAINS: none   LOCAL MEDICATIONS USED:  NONE  SPECIMEN:  Source of Specimen:  left ureteral and renal stone fragments  DISPOSITION OF SPECIMEN:  [facility] for compositional analysis  COUNTS:  YES  TOURNIQUET:  * No tourniquets in log *  DICTATION: .Other Dictation: Dictation Number [id number]  PLAN OF CARE: Admit for overnight observation  PATIENT DISPOSITION:  PACU - hemodynamically stable.   Delay start of Pharmacological VTE agent (>24hrs) due to surgical blood loss or risk of bleeding: yes

## 2016-08-21 NOTE — Discharge Summary (Signed)
Physician Discharge Summary  Patient ID: Jason HERMANNS MRN: [id number] DOB/AGE: 04/14/1966 50 y.o.  Admit date: 08/20/2016 Discharge date: 08/21/2016  Admission Diagnoses:  Discharge Diagnoses:  Active Problems:   Left ureteral stone   Discharged Condition: good  Hospital Course:   1 - Left Ureteral Stone with Acute Renal Failure - pt with new left distal ureteral stone and rise in creatinint from baseline <1.5 to 2.5 and poor PO intake. He was admitted through ER for IV hydration. In the late afternoon of 11/22 he underwent LEFT ureteroscopic stone manipulation to stone free and had ureteral stent placed which will remain at discharge. By the evening of POD 0, he is ambulatory and felt to be adequate for discharge.  Consults: None  Significant Diagnostic Studies: labs: as per above  Treatments: surgery:  LEFT ureteroscopic stone manipulation on 08/21/2016  Discharge Exam: Blood pressure 115/71, pulse 68, temperature 98.5 F (36.9 C), temperature source Oral, resp. rate 18, height 5\' 11"  (1.803 m), weight 87.9 kg (193 lb 11.2 oz), SpO2 98 %. General appearance: alert, cooperative and appears stated age Head: Normocephalic, without obvious abnormality Eyes: negative Nose: Nares normal. Septum midline. Mucosa normal. No drainage or sinus tenderness. Throat: lips, mucosa, and tongue normal; teeth and gums normal Neck: supple, symmetrical, trachea midline Back: symmetric, no curvature. ROM normal. No CVA tenderness. Resp: non-labored on room air Cardio: Nl rate GI: soft, non-tender; bowel sounds normal; no masses,  no organomegaly Male genitalia: normal, distal end of ureteral stent tether fashioned to dorsum of penis.  Extremities: extremities normal, atraumatic, no cyanosis or edema Skin: Skin color, texture, turgor normal. No rashes or lesions Lymph nodes: Cervical, supraclavicular, and axillary nodes normal. Neurologic: Grossly normal  Disposition: 01-Home or Self  Care     Medication List    TAKE these medications   atorvastatin 20 MG tablet Commonly known as:  LIPITOR Take 1 tablet (20 mg total) by mouth daily.   cephALEXin 500 MG capsule Commonly known as:  KEFLEX Take 1 capsule (500 mg total) by mouth 2 (two) times daily. X 5 days to prevent post-op infection while stent in place.   cetirizine 10 MG tablet Commonly known as:  ZYRTEC Take 1 tablet (10 mg total) by mouth daily.   clonazePAM 1 MG tablet Commonly known as:  KLONOPIN TAKE 1 TO 2 TABLETS BY MOUTH TWICE A DAY AS NEEDED FOR ANXIETY   gabapentin 300 MG capsule Commonly known as:  NEURONTIN Take 300 mg by mouth 3 (three) times daily.   lactulose 20 g packet Commonly known as:  CEPHULAC Take 1 packet (20 g total) by mouth 2 (two) times daily.   metoprolol succinate 25 MG 24 hr tablet Commonly known as:  TOPROL-XL Take 0.5 tablets (12.5 mg total) by mouth daily.   oxyCODONE-acetaminophen 5-325 MG tablet Commonly known as:  ROXICET Take 1-2 tablets by mouth every 6 (six) hours as needed for moderate pain or severe pain. Post-operatively   prazosin 2 MG capsule Commonly known as:  MINIPRESS Take 4 mg by mouth at bedtime.   PRESCRIPTION MEDICATION Prazosin 2 mg capsules   senna-docusate 8.6-50 MG tablet Commonly known as:  Senokot-S Take 1 tablet by mouth 2 (two) times daily. While taking pain meds to prevent constipation.   sildenafil 100 MG tablet Commonly known as:  VIAGRA Take 0.5-1 tablets (50-100 mg total) by mouth daily as needed for erectile dysfunction.   topiramate 100 MG tablet Commonly known as:  TOPAMAX Take 100 mg by  mouth 2 (two) times daily.   WELLBUTRIN XL 300 MG 24 hr tablet Generic drug:  buPROPion Take 300 mg by mouth daily.      Follow-up Information    Alexis Frock, MD.   Specialty:  Urology Why:  We will call to make follow up visit in several weeks.  Contact information: [address] [address] [phone]            Signed: Alexis Frock 08/21/2016, 7:38 PM

## 2016-08-21 NOTE — Progress Notes (Signed)
Pt VS were within normal limits and pt was able to ambulate in hallway on floor with minimal assistance just stand by. Pt voided with no issues other than a little burning at stent site. RN removed peripheral IV, went over discharge instructions with patient and new medications. Pt discharged to home with family here to drive him home. Carmela Hurt, RN 08/21/16 10:45 PM

## 2016-08-21 NOTE — Anesthesia Postprocedure Evaluation (Signed)
Anesthesia Post Note  Patient: SKYLER SCHRAMEL  Procedure(s) Performed: Procedure(s) (LRB): CYSTOSCOPY/LEFT RETROGRADE PYELOGRAM/LEFT URETEROSCOPY/HOLMIUM LASER/LEFT STENT PLACEMENT (Left)  Patient location during evaluation: PACU Anesthesia Type: General Level of consciousness: awake, awake and alert and oriented Pain management: pain level controlled Vital Signs Assessment: post-procedure vital signs reviewed and stable Respiratory status: spontaneous breathing, nonlabored ventilation and respiratory function stable Cardiovascular status: blood pressure returned to baseline Postop Assessment: no headache Anesthetic complications: no    Last Vitals:  Vitals:   08/21/16 2026 08/21/16 2043  BP: 125/67 124/70  Pulse: 81 79  Resp: 16 18  Temp: 36.9 C 37.1 C    Last Pain:  Vitals:   08/21/16 2124  TempSrc:   PainSc: 6                  Lavene Penagos COKER

## 2016-08-21 NOTE — Discharge Instructions (Signed)
1 - You may have urinary urgency (bladder spasms) and bloody urine on / off with stent in place. This is normal.  2 - Remove tethered stent on Monday morning at home by pulling on string, then blue-white plastic tubing and discarding.   3 - Call MD or go to ER for fever >102, severe pain / nausea / vomiting not relieved by medications, or acute change in medical status

## 2016-08-21 NOTE — ED Provider Notes (Signed)
Presents with complaint of left-sided flank pain. Pain is presently under control since treatment here. Patient is alert appears comfortable and in no distress.   Orlie Dakin, MD 08/21/16 [phone]

## 2016-08-21 NOTE — Transfer of Care (Signed)
Immediate Anesthesia Transfer of Care Note  Patient: Jason [name]  Procedure(s) Performed: Procedure(s): CYSTOSCOPY/LEFT RETROGRADE PYELOGRAM/LEFT URETEROSCOPY/HOLMIUM LASER/LEFT STENT PLACEMENT (Left)  Patient Location: PACU  Anesthesia Type:General  Level of Consciousness: awake, alert  and oriented  Airway & Oxygen Therapy: Patient Spontanous Breathing and Patient connected to face mask oxygen  Post-op Assessment: Report given to RN and Post -op Vital signs reviewed and stable  Post vital signs: Reviewed and stable  Last Vitals:  Vitals:   08/21/16 1100 08/21/16 1330  BP: 120/70 115/71  Pulse: 77 68  Resp:  18  Temp:  36.9 C    Last Pain:  Vitals:   08/21/16 1712  TempSrc:   PainSc: 5       Patients Stated Pain Goal: 3 ([id number])  Complications: No apparent anesthesia complications

## 2016-08-22 NOTE — Op Note (Signed)
NAMEKEIMONI, SKUSE                ACCOUNT NO.:  [id number]  MEDICAL RECORD NO.:  [id number]  LOCATION:  1402                         FACILITY:  [facility]  PHYSICIAN:  Alexis Frock, MD     DATE OF BIRTH:  06/04/1966  DATE OF PROCEDURE: 08/21/2016                               OPERATIVE REPORT   DIAGNOSES:  Left ureteral stone, refractory colic and acute renal failure.  PROCEDURES: 1. Cystoscopy with left retrograde pyelogram and interpretation. 2. Left ureteroscopy with laser lithotripsy. 3. Insertion of left ureteral stent, 5 x 26 Polaris, no tether.  ESTIMATED BLOOD LOSS:  Nil.  COMPLICATIONS:  None.  SPECIMEN:  Left ureteral and renal stone fragments for compositional analysis.  FINDINGS: 1. Distal left ureteral stone with moderate hydroureteronephrosis     above this. 2. Small papillary tip calcification, left midpole calyx. 3. Complete resolution of all stone fragments, larger than 1/3rd mm     following laser lithotripsy and basket extraction. 4. Successful placement of left ureteral stent, proximal end in the     renal pelvis and distal end in the urinary bladder.  INDICATION:  Mr. Jason is a 50 year old gentleman with a very recent history of nephrolithiasis.  He also has a recently started Topamax.  He was found on workup of colicky flank pain to have acute renal failure with a rising creatinine from mostly 1.5 to 2.5 and left distal ureteral stone with hydronephrosis.  He also admitted to several days of relative dehydration.  Prior options were discussed including recommended passive admission with left ureteroscopy with goal of stone free, and he wished to proceed.  Informed consent was obtained and placed in the medical record.  PROCEDURE IN DETAIL:  The patient being Camdan [name], was verified. Procedure being left ureteroscopic stone manipulation was confirmed. Procedure was carried out.  Time-out was performed.  Intravenous antibiotics were administered.   General endotracheal anesthesia was introduced.  The patient was placed into a low lithotomy position and sterile field was created by prepping and draping the patient's penis, perineum and proximal thighs using iodine x3.  Next, cystourethroscopy was performed using a rigid cystoscope with offset lens.  Inspection of the anterior and posterior urethra were unremarkable.  Inspection of the urinary bladder revealed no diverticula, calcifications or papillary lesions.  The left ureteral orifice was cannulated with a 6-French end- hole catheter and left retrograde pyelogram was obtained.  Left retrograde pyelogram demonstrated a single left ureter with single- system left kidney.  There was a filling defect in distal ureter consistent with known stone.  There was a moderate hydroureteronephrosis.  Above this, a 0.038 Zip wire was advanced to the level of the upper pole, set aside as a safety wire.  An 8-French feeding tube was placed in the urinary bladder for pressure release. Next, semi-rigid ureteroscopy was performed to the distal left ureter alongside a separate Sensor working wire.  The stone in question was indeed seen and the left ureter appeared to be too large for simple basketing.  As such, holmium laser energy applied to the stone using settings of 0.2 joules and 20 Hz fragmenting it into smaller pieces and the stone was quite soft, these  were then grasped and brought to the level of the urinary bladder for later retrieval.  The patient did have a known small left renal stone, the goal today was ipsilateral stone free.  The semi-rigid scope was carefully used to inspect the proximal or ureter allowing inspection of the distal four-fifths.  It was then exchanged over the Sensor working wire to the flexible digital ureteroscope to the level of the renal pelvis.  Systematic inspection of the left kidney including all calices x2 revealed a single papillary-tip calcification in the  midpole calyx as anticipated.  This did appear amenable to basketing, it was grasped with an Escape basket, removed, set aside for compositional analysis.  The fragments of the bladder were irrigated and set aside for compositional analysis.  Given the patient's recent acute renal failure, it was felt that brief interval stenting would be warranted.  As such, a new 5 x 26 Polaris-type stent was placed over the remaining safety wire using cystoscopic and fluoroscopic guidance.  Good proximal and distal deployment were noted, tether was left in place and fashioned to the dorsum of the penis and the procedure was terminated.  The patient tolerated the procedure well.  There were no immediate periprocedural complications.  The patient was taken to the postanesthesia care unit in stable condition.          ______________________________ Alexis Frock, MD     TM/MEDQ  D:  08/21/2016  T:  08/22/2016  Job:  [id number]

## 2016-08-26 ENCOUNTER — Encounter (HOSPITAL_COMMUNITY): Payer: Self-pay | Admitting: Urology

## 2016-09-24 ENCOUNTER — Other Ambulatory Visit: Payer: Self-pay | Admitting: Urgent Care

## 2016-09-24 DIAGNOSIS — Z8639 Personal history of other endocrine, nutritional and metabolic disease: Secondary | ICD-10-CM

## 2016-09-24 DIAGNOSIS — N529 Male erectile dysfunction, unspecified: Secondary | ICD-10-CM

## 2016-10-07 ENCOUNTER — Encounter: Payer: Self-pay | Admitting: Family Medicine

## 2016-10-25 ENCOUNTER — Ambulatory Visit (INDEPENDENT_AMBULATORY_CARE_PROVIDER_SITE_OTHER): Payer: BLUE CROSS/BLUE SHIELD | Admitting: Physician Assistant

## 2016-10-25 ENCOUNTER — Ambulatory Visit (INDEPENDENT_AMBULATORY_CARE_PROVIDER_SITE_OTHER): Payer: BLUE CROSS/BLUE SHIELD

## 2016-10-25 VITALS — BP 110/62 | HR 116 | Temp 101.1°F | Resp 20 | Ht 71.0 in | Wt 186.0 lb

## 2016-10-25 DIAGNOSIS — R000 Tachycardia, unspecified: Secondary | ICD-10-CM

## 2016-10-25 DIAGNOSIS — R0789 Other chest pain: Secondary | ICD-10-CM | POA: Diagnosis not present

## 2016-10-25 DIAGNOSIS — R69 Illness, unspecified: Secondary | ICD-10-CM

## 2016-10-25 DIAGNOSIS — J111 Influenza due to unidentified influenza virus with other respiratory manifestations: Secondary | ICD-10-CM

## 2016-10-25 MED ORDER — ALBUTEROL SULFATE (2.5 MG/3ML) 0.083% IN NEBU
2.5000 mg | INHALATION_SOLUTION | Freq: Once | RESPIRATORY_TRACT | Status: AC
  Administered 2016-10-25: 2.5 mg via RESPIRATORY_TRACT

## 2016-10-25 MED ORDER — ACETAMINOPHEN 500 MG PO TABS
1000.0000 mg | ORAL_TABLET | Freq: Once | ORAL | Status: AC
  Administered 2016-10-25: 1000 mg via ORAL

## 2016-10-25 MED ORDER — IPRATROPIUM BROMIDE 0.02 % IN SOLN
0.5000 mg | Freq: Once | RESPIRATORY_TRACT | Status: AC
  Administered 2016-10-25: 0.5 mg via RESPIRATORY_TRACT

## 2016-10-25 MED ORDER — ALBUTEROL SULFATE HFA 108 (90 BASE) MCG/ACT IN AERS: 2.0000 | INHALATION_SPRAY | Freq: Four times a day (QID) | RESPIRATORY_TRACT | 0 refills | Status: DC | PRN

## 2016-10-25 MED ORDER — DOXYCYCLINE HYCLATE 100 MG PO CAPS
100.0000 mg | ORAL_CAPSULE | Freq: Two times a day (BID) | ORAL | 0 refills | Status: AC
Start: 2016-10-25 — End: 2016-11-04

## 2016-10-25 MED ORDER — OSELTAMIVIR PHOSPHATE 75 MG PO CAPS: 75.0000 mg | ORAL_CAPSULE | Freq: Two times a day (BID) | ORAL | 0 refills | Status: DC

## 2016-10-25 NOTE — Progress Notes (Signed)
10/25/2016 8:00 PM   DOB: 1966/09/14 / MRN: [id number]  SUBJECTIVE:  Jason [name] is a 51 y.o. male presenting for fever that started 2 days ago.  Associates sore throat, cough (yesterday), myalgia, diarrhea along with chest congestion. Has tried Mucinex along with Ibuprofen.     He does complain of chest pressure.  Feels this is due to his breathing.  He does have a family history of CAD and is taking lipid medication.    He is allergic to hydrocodone-acetaminophen.   He  has a past medical history of Allergy; Anemia; Anxiety state, unspecified; Arthritis; Blood transfusion (march 2012); Blood transfusion without reported diagnosis; Depression; Diverticulitis; Diverticulosis of colon (without mention of hemorrhage); Esophageal reflux; Family history of malignant neoplasm of gastrointestinal tract; Hernia; Hiatal hernia; IBS (irritable bowel syndrome); IBS (irritable bowel syndrome); Irritable bowel syndrome; Other and unspecified hyperlipidemia; Peptic ulcer; Pneumonia; PONV (postoperative nausea and vomiting); Recurrent upper respiratory infection (URI); Renal disorder; Spondylitis, 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    He  reports that he has never smoked. He has never used smokeless tobacco. He reports that he drinks alcohol. He reports that he does not use drugs. He  reports that he currently engages in sexual activity and has had male partners. The patient  has a past surgical history that includes Umbilical hernia repair (01/2011); Cervical fusion ( 2007); Stomach surgery (march 2012); Back surgery; Appendectomy (2000); laparoscopy (11/01/2011); laparotomy (01/10/2012); Bowel resection (01/10/2012); Small intestine surgery (12/2011); Hernia repair (11/01/2011); Colon surgery; Spine surgery; and Cystoscopy/ureteroscopy/holmium laser/stent placement (Left, 08/21/2016).  His family history includes COPD in his  sister; Cancer in his paternal grandmother; Clotting disorder in his mother; Colon cancer in his maternal grandmother; Dementia in his mother; Diabetes in his father and paternal grandmother; Esophageal cancer in his maternal grandmother; Heart disease in his father, mother, paternal grandfather, and sister; Hyperlipidemia in his paternal grandmother; Hypertension in his father, mother, paternal grandfather, and sister; Leukemia in his brother; Mental illness in his sister; Pancreatic cancer in his maternal grandfather; Stomach cancer in his maternal grandmother; Stroke in his mother; Uterine cancer in his sister.  Review of Systems  Constitutional: Positive for chills and fever.  Respiratory: Positive for cough and shortness of breath. Negative for hemoptysis, sputum production and wheezing.   Cardiovascular: Positive for chest pain (pressure).  Gastrointestinal: Negative for nausea.  Skin: Negative for rash.  Neurological: Positive for dizziness and headaches.    The problem list and medications were reviewed and updated by myself where necessary and exist elsewhere in the encounter.   OBJECTIVE:  BP 110/62 (BP Location: Right Arm, Patient Position: Sitting, Cuff Size: Small)   Pulse (!) 116   Temp (!) 101.1 F (38.4 C) (Oral)   Resp 20   Ht 5\' 11"  (1.803 m)   Wt 186 lb (84.4 kg)   SpO2 95%   PF 350 L/min   BMI 25.94 kg/m   Physical Exam  Constitutional: He is oriented to person, place, and time. He appears ill.  Cardiovascular: Regular rhythm, normal heart sounds and intact distal pulses.  Tachycardia present.  Exam reveals no gallop and no friction rub.   No murmur heard. Pulmonary/Chest: Effort normal and breath sounds normal. No respiratory distress. He has no wheezes. He has no rales. He exhibits no tenderness.  Musculoskeletal: Normal range of motion.  Neurological: He is alert and oriented to person, place, and time.  Skin: He is not diaphoretic.  Vitals reviewed.   Lab  Results  Component Value Date   CHOL 155 07/18/2016   HDL 55 07/18/2016   LDLCALC 84 07/18/2016   TRIG 78 07/18/2016   CHOLHDL 2.8 07/18/2016     No results found for this or any previous visit (from the past 72 hour(s)).  Dg Chest 2 View  Addendum Date: 10/25/2016   ADDENDUM REPORT: 10/25/2016 16:15 ADDENDUM: The report above should read as follows : FINDINGS: Mediastinum hilar structures normal. Left base pleuroparenchymal thickening again noted consistent with scarring . Left lower lobe mild infiltrate cannot be excluded. No pleural effusion. No pneumothorax. Cervical spine fusion. IMPRESSION: Mild base pleural-parenchymal thickening again noted consistent with scarring. Mild left lower lobe infiltrate cannot be excluded . Electronically Signed   By: Marcello Moores  Register   On: 10/25/2016 16:15   Result Date: 10/25/2016 CLINICAL DATA:  Chest tightness.  Cough. EXAM: CHEST  2 VIEW COMPARISON:  CT 11/02/2012 .  Chest x-ray 11/02/2012. FINDINGS: Mediastinum and hilar structures are normal. Left lower lobe infiltrate. No pleural effusion or pneumothorax. No acute bony abnormality. Cervical spine fusion. IMPRESSION: Left base pleuroparenchymal thickening again noted consistent with scarring. No focal alveolar infiltrate. Electronically Signed: By: Marcello Moores  Register On: 10/25/2016 15:51    ASSESSMENT AND PLAN:  [name] was seen today for fever, chills, diarrhea, chest pain and cough.  Diagnoses and all orders for this visit:  Influenza-like illness: Advised he avoid NSAIDs given renal function.   -     acetaminophen (TYLENOL) tablet 1,000 mg; Take 2 tablets (1,000 mg total) by mouth once. -     oseltamivir (TAMIFLU) 75 MG capsule; Take 1 capsule (75 mg total) by mouth 2 (two) times daily.  Chest tightness: Pneumonia could not be excluded on rads.  Given he likely has the flu will cover for a staph pneumo with doxy.  He will come back to clinic tomorrow if he is feeling worse.  -     DG Chest 2  View; Future -     albuterol (PROVENTIL) (2.5 MG/3ML) 0.083% nebulizer solution 2.5 mg; Take 3 mLs (2.5 mg total) by nebulization once. -     ipratropium (ATROVENT) nebulizer solution 0.5 mg; Take 2.5 mLs (0.5 mg total) by nebulization once. -     doxycycline (VIBRAMYCIN) 100 MG capsule; Take 1 capsule (100 mg total) by mouth 2 (two) times daily. -     albuterol (PROVENTIL HFA;VENTOLIN HFA) 108 (90 Base) MCG/ACT inhaler; Inhale 2 puffs into the lungs every 6 (six) hours as needed for wheezing or shortness of breath.  Tachycardia -     EKG 12-Lead -     doxycycline (VIBRAMYCIN) 100 MG capsule; Take 1 capsule (100 mg total) by mouth 2 (two) times daily.     The patient is advised to call or return to clinic if he does not see an improvement in symptoms, or to seek the care of the closest emergency department if he worsens with the above plan.   Philis Fendt, MHS, PA-C [facility] 10/25/2016 8:00 PM

## 2016-10-25 NOTE — Patient Instructions (Addendum)
If you have worsening SOB then please go directly to the ED. Please use your albuterol every 6 hours as needed for cough and SOB.   Please force fluids.  It is okay to take immodium to stop diarrhea as this help you maintain hydration.  Take 1000 mg of tylenol every 8 hours for fever and muscle aches.  Come back tomorrow if you are worsening.  Go to the ED tonight for SOB.     IF you received an x-ray today, you will receive an invoice from [facility]. Please contact [facility] at [phone] with questions or concerns regarding your invoice.   IF you received labwork today, you will receive an invoice from [facility]. Please contact [facility] at [phone] with questions or concerns regarding your invoice.   Our billing staff will not be able to assist you with questions regarding bills from these companies.  You will be contacted with the lab results as soon as they are available. The fastest way to get your results is to activate your My Chart account. Instructions are located on the last page of this paperwork. If you have not heard from [place] regarding the results in 2 weeks, please contact this office.

## 2016-12-23 ENCOUNTER — Encounter (HOSPITAL_COMMUNITY): Payer: Self-pay | Admitting: Urology

## 2017-03-13 ENCOUNTER — Encounter: Payer: Self-pay | Admitting: Family Medicine

## 2017-05-12 ENCOUNTER — Other Ambulatory Visit: Payer: Self-pay | Admitting: Family Medicine

## 2017-05-12 DIAGNOSIS — E7800 Pure hypercholesterolemia, unspecified: Secondary | ICD-10-CM

## 2017-05-14 ENCOUNTER — Encounter: Payer: Self-pay | Admitting: Urgent Care

## 2017-05-14 ENCOUNTER — Ambulatory Visit (INDEPENDENT_AMBULATORY_CARE_PROVIDER_SITE_OTHER): Payer: BLUE CROSS/BLUE SHIELD | Admitting: Urgent Care

## 2017-05-14 VITALS — BP 145/88 | HR 93 | Temp 98.1°F | Resp 16 | Ht 71.0 in | Wt 185.4 lb

## 2017-05-14 DIAGNOSIS — Z8719 Personal history of other diseases of the digestive system: Secondary | ICD-10-CM | POA: Diagnosis not present

## 2017-05-14 DIAGNOSIS — R1310 Dysphagia, unspecified: Secondary | ICD-10-CM | POA: Diagnosis not present

## 2017-05-14 DIAGNOSIS — R591 Generalized enlarged lymph nodes: Secondary | ICD-10-CM | POA: Diagnosis not present

## 2017-05-14 DIAGNOSIS — R059 Cough, unspecified: Secondary | ICD-10-CM

## 2017-05-14 DIAGNOSIS — R05 Cough: Secondary | ICD-10-CM | POA: Diagnosis not present

## 2017-05-14 DIAGNOSIS — R490 Dysphonia: Secondary | ICD-10-CM | POA: Diagnosis not present

## 2017-05-14 LAB — POCT RAPID STREP A (OFFICE): Rapid Strep A Screen: NEGATIVE

## 2017-05-14 MED ORDER — OMEPRAZOLE 20 MG PO CPDR: 20.0000 mg | DELAYED_RELEASE_CAPSULE | Freq: Every day | ORAL | 3 refills | Status: AC

## 2017-05-14 NOTE — Progress Notes (Signed)
MRN: [id number] DOB: [birth year]  Subjective:   Jason [name] is a 51 y.o. male presenting for chief complaint of Adenopathy (neck)  Reports onset of lymph node pain in his neck this morning. Also admits ~6 month history of hoarseness, difficulty swallowing solid foods, liquids are not a problem. Has had a persistent dry cough daily, worse in the evening when he lays down and throughout the night. Denies fever, sinus congestion, sinus pain, ear pain, ear drainage, sore throat, chest pain, shob, n/v, abdominal pain, rashes, weight loss. Denies smoking cigarettes. Admits personal history of GERD, has taken Prilosec and Nexium for this with good results. Family history is positive for leukemia in his brother (passed at 80 y/o from this). His maternal grandfather had throat cancer (non-smoker), maternal grandmother had colon and stomach cancer.   Jason [name] has a current medication list which includes the following prescription(s): aripiprazole, atorvastatin, bupropion, clonazepam, metoprolol succinate, prazosin, topiramate, trazodone, viagra, zaleplon, and tramadol. Also is allergic to hydrocodone-acetaminophen.  Jason [name]  has a past medical history of Allergy; Anemia; Anxiety state, unspecified; Arthritis; Blood transfusion (march 2012); Blood transfusion without reported diagnosis; Depression; Diverticulitis; Diverticulosis of colon (without mention of hemorrhage); Esophageal reflux; Family history of malignant neoplasm of gastrointestinal tract; Hernia; Hiatal hernia; IBS (irritable bowel syndrome); IBS (irritable bowel syndrome); Irritable bowel syndrome; Other and unspecified hyperlipidemia; Peptic ulcer; Pneumonia; PONV (postoperative nausea and vomiting); Recurrent upper respiratory infection (URI); Renal disorder; Spondylitis, 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 Also  has a past surgical history that includes  Umbilical hernia repair (01/2011); Cervical fusion ( 2007); Stomach surgery (march 2012); Back surgery; Appendectomy (2000); laparoscopy (11/01/2011); laparotomy (01/10/2012); Bowel resection (01/10/2012); Small intestine surgery (12/2011); Hernia repair (11/01/2011); Colon surgery; Spine surgery; and Cystoscopy/ureteroscopy/holmium laser/stent placement (Left, 08/21/2016).  Objective:   Vitals: BP (!) 145/88 (BP Location: Right Arm, Patient Position: Sitting, Cuff Size: Normal)   Pulse 93   Temp 98.1 F (36.7 C) (Oral)   Resp 16   Ht 5\' 11"  (1.803 m)   Wt 185 lb 6.4 oz (84.1 kg)   SpO2 100%   BMI 25.86 kg/m   Wt Readings from Last 3 Encounters:  05/14/17 185 lb 6.4 oz (84.1 kg)  10/25/16 186 lb (84.4 kg)  08/21/16 193 lb 11.2 oz (87.9 kg)   Physical Exam  Constitutional: He is oriented to person, place, and time. He appears well-developed and well-nourished.  HENT:  TM's intact bilaterally, no effusions or erythema. Nasal turbinates pink and moist, nasal passages patent. No sinus tenderness. Oropharynx clear, mucous membranes moist.  Eyes: Right eye exhibits no discharge. Left eye exhibits no discharge. No scleral icterus.  Neck: Normal range of motion. Neck supple.  Cardiovascular: Normal rate, regular rhythm and intact distal pulses.  Exam reveals no gallop and no friction rub.   No murmur heard. Pulmonary/Chest: No respiratory distress. He has no wheezes. He has no rales.  Abdominal: Soft. Bowel sounds are normal. He exhibits no distension and no mass. There is no tenderness. There is no guarding.  Lymphadenopathy:       Head (right side): No submental, no submandibular, no preauricular, no posterior auricular and no occipital adenopathy present.       Head (left side): Submandibular adenopathy present. No submental, no preauricular, no posterior auricular and no occipital adenopathy present.    He has no cervical adenopathy.       Right cervical: No superficial cervical, no deep  cervical and no posterior  cervical adenopathy present.      Left cervical: No superficial cervical, no deep cervical and no posterior cervical adenopathy present.    He has no axillary adenopathy.       Right: No inguinal adenopathy present.       Left: No inguinal adenopathy present.  Neurological: He is alert and oriented to person, place, and time.  Skin: Skin is warm and dry. No rash noted.  Psychiatric: He has a normal mood and affect.   Results for orders placed or performed in visit on 05/14/17 (from the past 24 hour(s))  POCT rapid strep A     Status: None   Collection Time: 05/14/17 11:48 AM  Result Value Ref Range   Rapid Strep A Screen Negative Negative   Assessment and Plan :   1. Lymphadenopathy 2. Hoarseness of voice 3. Cough 4. Dysphagia, unspecified type 5. History of gastroesophageal reflux (GERD) - Unclear etiology. Differential includes viral syndrome, mono, strep. Labs are pending. Given hoarseness, difficulty swallowing, persistent cough, malignancy is in the differential. Will wait on results and consider ordering a U/S, repeat chest x-ray or referral to ENT. Will follow up with labs. Cannot recommend NSAIDs for now because of his last creatinine levels.   Jaynee Eagles, PA-C [facility] [phone] 05/14/2017  11:32 AM

## 2017-05-14 NOTE — Patient Instructions (Addendum)
Lymphadenopathy Lymphadenopathy refers to swollen or enlarged lymph glands, also called lymph nodes. Lymph glands are part of your body's defense (immune) system, which protects the body from infections, germs, and diseases. Lymph glands are found in many locations in your body, including the neck, underarm, and groin. Many things can cause lymph glands to become enlarged. When your immune system responds to germs, such as viruses or bacteria, infection-fighting cells and fluid build up. This causes the glands to grow in size. Usually, this is not something to worry about. The swelling and any soreness often go away without treatment. However, swollen lymph glands can also be caused by a number of diseases. Your health care provider may do various tests to help determine the cause. If the cause of your swollen lymph glands cannot be found, it is important to monitor your condition to make sure the swelling goes away. Follow these instructions at home: Watch your condition for any changes. The following actions may help to lessen any discomfort you are feeling:  Get plenty of rest.  Take medicines only as directed by your health care provider. Your health care provider may recommend over-the-counter medicines for pain.  Apply moist heat compresses to the site of swollen lymph nodes as directed by your health care provider. This can help reduce any pain.  Check your lymph nodes daily for any changes.  Keep all follow-up visits as directed by your health care provider. This is important.  Contact a health care provider if:  Your lymph nodes are still swollen after 2 weeks.  Your swelling increases or spreads to other areas.  Your lymph nodes are hard, seem fixed to the skin, or are growing rapidly.  Your skin over the lymph nodes is red and inflamed.  You have a fever.  You have chills.  You have fatigue.  You develop a sore throat.  You have abdominal pain.  You have weight  loss.  You have night sweats. Get help right away if:  You notice fluid leaking from the area of the enlarged lymph node.  You have severe pain in any area of your body.  You have chest pain.  You have shortness of breath. This information is not intended to replace advice given to you by your health care provider. Make sure you discuss any questions you have with your health care provider. Document Released: 06/25/2008 Document Revised: 02/22/2016 Document Reviewed: 04/21/2014 Elsevier Interactive Patient Education  2018 Elsevier Inc.     IF you received an x-ray today, you will receive an invoice from [facility]. Please contact [facility] at [phone] with questions or concerns regarding your invoice.   IF you received labwork today, you will receive an invoice from [facility]. Please contact [facility] at [phone] with questions or concerns regarding your invoice.   Our billing staff will not be able to assist you with questions regarding bills from these companies.  You will be contacted with the lab results as soon as they are available. The fastest way to get your results is to activate your My Chart account. Instructions are located on the last page of this paperwork. If you have not heard from us regarding the results in 2 weeks, please contact this office.     

## 2017-05-15 ENCOUNTER — Other Ambulatory Visit: Payer: Self-pay | Admitting: Orthopaedic Surgery

## 2017-05-15 DIAGNOSIS — M4326 Fusion of spine, lumbar region: Secondary | ICD-10-CM

## 2017-05-15 LAB — COMPREHENSIVE METABOLIC PANEL
A/G RATIO: 2.1 (ref 1.2–2.2)
ALBUMIN: 4.8 g/dL (ref 3.5–5.5)
ALT: 11 IU/L (ref 0–44)
AST: 19 IU/L (ref 0–40)
Alkaline Phosphatase: 97 IU/L (ref 39–117)
BILIRUBIN TOTAL: 0.2 mg/dL (ref 0.0–1.2)
BUN / CREAT RATIO: 12 (ref 9–20)
BUN: 17 mg/dL (ref 6–24)
CHLORIDE: 107 mmol/L — AB (ref 96–106)
CO2: 22 mmol/L (ref 20–29)
Calcium: 10 mg/dL (ref 8.7–10.2)
Creatinine, Ser: 1.46 mg/dL — ABNORMAL HIGH (ref 0.76–1.27)
GFR calc non Af Amer: 55 mL/min/{1.73_m2} — ABNORMAL LOW (ref >59)
GFR, EST AFRICAN AMERICAN: 64 mL/min/{1.73_m2} (ref >59)
GLOBULIN, TOTAL: 2.3 g/dL (ref 1.5–4.5)
GLUCOSE: 100 mg/dL — AB (ref 65–99)
Potassium: 4 mmol/L (ref 3.5–5.2)
SODIUM: 144 mmol/L (ref 134–144)
TOTAL PROTEIN: 7.1 g/dL (ref 6.0–8.5)

## 2017-05-15 LAB — CBC WITH DIFFERENTIAL/PLATELET
Basophils Absolute: 0.1 10*3/uL (ref 0.0–0.2)
Basos: 1 %
EOS (ABSOLUTE): 0 10*3/uL (ref 0.0–0.4)
Eos: 1 %
Hematocrit: 44 % (ref 37.5–51.0)
Hemoglobin: 14.6 g/dL (ref 13.0–17.7)
IMMATURE GRANULOCYTES: 1 %
Immature Grans (Abs): 0 10*3/uL (ref 0.0–0.1)
LYMPHS ABS: 1.4 10*3/uL (ref 0.7–3.1)
Lymphs: 19 %
MCH: 29 pg (ref 26.6–33.0)
MCHC: 33.2 g/dL (ref 31.5–35.7)
MCV: 87 fL (ref 79–97)
MONOS ABS: 0.6 10*3/uL (ref 0.1–0.9)
Monocytes: 8 %
NEUTROS PCT: 70 %
Neutrophils Absolute: 5.1 10*3/uL (ref 1.4–7.0)
Platelets: 218 10*3/uL (ref 150–379)
RBC: 5.04 x10E6/uL (ref 4.14–5.80)
RDW: 14.3 % (ref 12.3–15.4)
WBC: 7.2 10*3/uL (ref 3.4–10.8)

## 2017-05-15 LAB — TSH: TSH: 1.7 u[IU]/mL (ref 0.450–4.500)

## 2017-05-15 LAB — EPSTEIN-BARR VIRUS VCA ANTIBODY PANEL
EBV EARLY ANTIGEN AB, IGG: 93.4 U/mL — AB (ref 0.0–8.9)
EBV NA IgG: >600 U/mL — ABNORMAL HIGH (ref 0.0–17.9)
EBV VCA IGG: 347 U/mL — AB (ref 0.0–17.9)

## 2017-05-15 LAB — SEDIMENTATION RATE: Sed Rate: 2 mm/hr (ref 0–30)

## 2017-05-16 ENCOUNTER — Encounter: Payer: Self-pay | Admitting: Urgent Care

## 2017-05-16 LAB — CULTURE, GROUP A STREP

## 2017-05-17 ENCOUNTER — Other Ambulatory Visit: Payer: Self-pay

## 2017-05-17 MED ORDER — AMOXICILLIN 875 MG PO TABS: 875.0000 mg | ORAL_TABLET | Freq: Two times a day (BID) | ORAL | 0 refills | Status: DC

## 2017-05-17 NOTE — Telephone Encounter (Signed)
Resent rx to [facility] on [address]

## 2017-05-28 ENCOUNTER — Ambulatory Visit
Admission: RE | Admit: 2017-05-28 | Discharge: 2017-05-28 | Disposition: A | Discharge status: Home / Self Care | Payer: BLUE CROSS/BLUE SHIELD | Source: Ambulatory Visit | Attending: Orthopaedic Surgery | Admitting: Orthopaedic Surgery

## 2017-05-28 DIAGNOSIS — M4326 Fusion of spine, lumbar region: Secondary | ICD-10-CM

## 2017-05-28 DIAGNOSIS — K279 Peptic ulcer, site unspecified, unspecified as acute or chronic, without hemorrhage or perforation: Secondary | ICD-10-CM | POA: Insufficient documentation

## 2017-05-28 DIAGNOSIS — M459 Ankylosing spondylitis of unspecified sites in spine: Secondary | ICD-10-CM | POA: Insufficient documentation

## 2017-05-28 MED ORDER — DIAZEPAM 5 MG PO TABS
5.0000 mg | ORAL_TABLET | Freq: Once | ORAL | Status: AC
  Administered 2017-05-28: 5 mg via ORAL

## 2017-05-28 MED ORDER — ONDANSETRON HCL 4 MG/2ML IJ SOLN
4.0000 mg | Freq: Once | INTRAMUSCULAR | Status: AC
  Administered 2017-05-28: 4 mg via INTRAMUSCULAR

## 2017-05-28 MED ORDER — MEPERIDINE HCL 100 MG/ML IJ SOLN
75.0000 mg | Freq: Once | INTRAMUSCULAR | Status: AC
  Administered 2017-05-28: 75 mg via INTRAMUSCULAR

## 2017-05-28 MED ORDER — IOPAMIDOL (ISOVUE-M 200) INJECTION 41%
15.0000 mL | Freq: Once | INTRAMUSCULAR | Status: AC
  Administered 2017-05-28: 15 mL via INTRATHECAL

## 2017-05-28 NOTE — Progress Notes (Signed)
Pt states he has been off Wellbutrin, Abilify, Tramadol and Trazodone for the past 2 days.

## 2017-05-28 NOTE — Discharge Instructions (Signed)
Myelogram Discharge Instructions  1. Go home and rest quietly for the next 24 hours.  It is important to lie flat for the next 24 hours.  Get up only to go to the restroom.  You may lie in the bed or on a couch on your back, your stomach, your left side or your right side.  You may have one pillow under your head.  You may have pillows between your knees while you are on your side or under your knees while you are on your back.  2. DO NOT drive today.  Recline the seat as far back as it will go, while still wearing your seat belt, on the way home.  3. You may get up to go to the bathroom as needed.  You may sit up for 10 minutes to eat.  You may resume your normal diet and medications unless otherwise indicated.  Drink lots of extra fluids today and tomorrow.  4. The incidence of headache, nausea, or vomiting is about 5% (one in 20 patients).  If you develop a headache, lie flat and drink plenty of fluids until the headache goes away.  Caffeinated beverages may be helpful.  If you develop severe nausea and vomiting or a headache that does not go away with flat bed rest, call [phone].  5. You may resume normal activities after your 24 hours of bed rest is over; however, do not exert yourself strongly or do any heavy lifting tomorrow. If when you get up you have a headache when standing, go back to bed and force fluids for another 24 hours.  6. Call your physician for a follow-up appointment.  The results of your myelogram will be sent directly to your physician by the following day.  7. If you have any questions or if complications develop after you arrive home, please call [phone].  Discharge instructions have been explained to the patient.  The patient, or the person responsible for the patient, fully understands these instructions.       May resume Wellbutrin, Trazodone, Abilify and Tramadol on Aug. 30, 2018, after 9:30 am.

## 2017-07-04 ENCOUNTER — Telehealth: Payer: Self-pay | Admitting: Family Medicine

## 2017-07-04 NOTE — Telephone Encounter (Signed)
RN Ammaron called from [facility] and wanted to let us know that the pt. Has spinal fusion surgery scheduled for 07/09/17 and has joined [facility]. His direct line is [phone] ext. [phone]

## 2017-07-08 NOTE — Telephone Encounter (Signed)
More information please.  Was this just to advise me or was other information needed?

## 2017-07-09 NOTE — Telephone Encounter (Signed)
lmvm for Ammron to call me back

## 2017-07-14 NOTE — Telephone Encounter (Signed)
For advisement only.

## 2017-09-25 ENCOUNTER — Telehealth: Payer: Self-pay | Admitting: Urgent Care

## 2017-09-25 ENCOUNTER — Encounter: Payer: Self-pay | Admitting: Urgent Care

## 2017-09-25 ENCOUNTER — Ambulatory Visit (INDEPENDENT_AMBULATORY_CARE_PROVIDER_SITE_OTHER): Payer: BLUE CROSS/BLUE SHIELD

## 2017-09-25 ENCOUNTER — Ambulatory Visit (INDEPENDENT_AMBULATORY_CARE_PROVIDER_SITE_OTHER): Payer: BLUE CROSS/BLUE SHIELD | Admitting: Urgent Care

## 2017-09-25 VITALS — BP 126/84 | HR 108 | Temp 98.1°F | Resp 16 | Ht 71.0 in | Wt 187.0 lb

## 2017-09-25 DIAGNOSIS — R319 Hematuria, unspecified: Secondary | ICD-10-CM

## 2017-09-25 DIAGNOSIS — Z87442 Personal history of urinary calculi: Secondary | ICD-10-CM

## 2017-09-25 DIAGNOSIS — R109 Unspecified abdominal pain: Secondary | ICD-10-CM | POA: Diagnosis not present

## 2017-09-25 DIAGNOSIS — K581 Irritable bowel syndrome with constipation: Secondary | ICD-10-CM

## 2017-09-25 LAB — POCT URINALYSIS DIPSTICK
BILIRUBIN UA: NEGATIVE
GLUCOSE UA: NEGATIVE
KETONES UA: NEGATIVE
Leukocytes, UA: NEGATIVE
Nitrite, UA: NEGATIVE
PH UA: 5.5 (ref 5.0–8.0)
Protein, UA: 30
Spec Grav, UA: >=1.03 — AB (ref 1.010–1.025)
UROBILINOGEN UA: 0.2 U/dL

## 2017-09-25 LAB — POC MICROSCOPIC URINALYSIS (UMFC): Mucus: ABSENT

## 2017-09-25 MED ORDER — ONDANSETRON 8 MG PO TBDP: 8.0000 mg | ORAL_TABLET | Freq: Three times a day (TID) | ORAL | 0 refills | Status: DC | PRN

## 2017-09-25 MED ORDER — TAMSULOSIN HCL 0.4 MG PO CAPS: 0.4000 mg | ORAL_CAPSULE | Freq: Every day | ORAL | 3 refills | Status: DC

## 2017-09-25 NOTE — Telephone Encounter (Signed)
Copied from [place] [id number]. Topic: Quick Communication - See Telephone Encounter >> Sep 25, 2017  1:33 PM Robina Ade, Helene Kelp D wrote: CRM for notification. See Telephone encounter for: 09/25/17. Patient called and said that the medication that was prescribed to him was sent to the wrong pharmacy. He no longer use [facility] on [address], his new pharmacy is [facility] [place], [place]. Please send to correct pharmacy, thanks.

## 2017-09-25 NOTE — Patient Instructions (Signed)
Flank Pain, Adult Flank pain is pain that is located on the side of the body between the upper abdomen and the back. This area is called the flank. The pain may occur over a short period of time (acute), or it may be long-term or recurring (chronic). It may be mild or severe. Flank pain can be caused by many things, including:  Muscle soreness or injury.  Kidney stones or kidney disease.  Stress.  A disease of the spine (vertebral disk disease).  A lung infection (pneumonia).  Fluid around the lungs (pulmonary edema).  A skin rash caused by the chickenpox virus (shingles).  Tumors that affect the back of the abdomen.  Gallbladder disease.  Follow these instructions at home:  Drink enough fluid to keep your urine clear or pale yellow.  Rest as told by your health care provider.  Take over-the-counter and prescription medicines only as told by your health care provider.  Keep a journal to track what has caused your flank pain and what has made it feel better.  Keep all follow-up visits as told by your health care provider. This is important. Contact a health care provider if:  Your pain is not controlled with medicine.  You have new symptoms.  Your pain gets worse.  You have a fever.  Your symptoms last longer than 2-3 days.  You have trouble urinating or you are urinating very frequently. Get help right away if:  You have trouble breathing or you are short of breath.  Your abdomen hurts or it is swollen or red.  You have nausea or vomiting.  You feel faint or you pass out.  You have blood in your urine. Summary  Flank pain is pain that is located on the side of the body between the upper abdomen and the back.  The pain may occur over a short period of time (acute), or it may be long-term or recurring (chronic). It may be mild or severe.  Flank pain can be caused by many things.  Contact your health care provider if your symptoms get worse or they last  longer than 2-3 days. This information is not intended to replace advice given to you by your health care provider. Make sure you discuss any questions you have with your health care provider. Document Released: 11/07/2005 Document Revised: 11/29/2016 Document Reviewed: 11/29/2016 Elsevier Interactive Patient Education  2018 Elsevier Inc.  

## 2017-09-25 NOTE — Progress Notes (Signed)
MRN: [id number] DOB: October 01, 1965  Subjective:   Jason [name] is a 51 y.o. male presenting for 2 day history of severe right sided flank pain, pelvic pressure, urinary urgency, dark urine, subjective fever, nausea without vomiting. Has used percocet that he had left over from his recent spinal fusion surgery with minimal relief. Denies dysuria, urinary frequency. Has a history of renal stones with stent placement in 2017. He is hydrating aggressively. Has a history of IBS-constipation. Denies having had issues with this, had loose stools over the weekend.   Eeshan has a current medication list which includes the following prescription(s): atorvastatin, clonazepam, metoprolol succinate, omeprazole, prazosin, topiramate, trazodone, viagra, zaleplon, amoxicillin, aripiprazole, bupropion, duloxetine, gabapentin, and tramadol. Also is allergic to hydrocodone-acetaminophen.  Aurelius  has a past medical history of Allergy, Anemia, Anxiety state, unspecified, Arthritis, Blood transfusion (march 2012), Blood transfusion without reported diagnosis, Depression, Diverticulitis, Diverticulosis of colon (without mention of hemorrhage), Esophageal reflux, Family history of malignant neoplasm of gastrointestinal tract, Hernia, Hiatal hernia, IBS (irritable bowel syndrome), IBS (irritable bowel syndrome), Irritable bowel syndrome, Other and unspecified hyperlipidemia, Peptic ulcer, Pneumonia, PONV (postoperative nausea and vomiting), Recurrent upper respiratory infection (URI), Renal disorder, Spondylitis, 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 Also  has a past surgical history that includes Umbilical hernia repair (01/2011); Cervical fusion ( 2007); Stomach surgery (march 2012); Back surgery; Appendectomy (2000); laparoscopy (11/01/2011); laparotomy (01/10/2012); Bowel resection (01/10/2012); Small intestine surgery (12/2011); Hernia  repair (11/01/2011); Colon surgery; Spine surgery; and Cystoscopy/ureteroscopy/holmium laser/stent placement (Left, 08/21/2016).  Objective:   Vitals: BP 126/84 (BP Location: Right Arm, Patient Position: Sitting, Cuff Size: Normal)   Pulse (!) 108   Temp 98.1 F (36.7 C) (Oral)   Resp 16   Ht 5\' 11"  (1.803 m)   Wt 187 lb (84.8 kg)   SpO2 98%   BMI 26.08 kg/m   Physical Exam  Constitutional: He is oriented to person, place, and time. He appears well-developed and well-nourished.  HENT:  Mouth/Throat: Oropharynx is clear and moist.  Cardiovascular: Normal rate, regular rhythm and intact distal pulses. Exam reveals no gallop and no friction rub.  No murmur heard. Pulmonary/Chest: No respiratory distress. He has no wheezes. He has no rales.  Abdominal: Soft. Bowel sounds are normal. He exhibits no distension and no mass. There is tenderness (mild over right side). There is no guarding.  Mild bilateral CVA tenderness.  Neurological: He is alert and oriented to person, place, and time.  Skin: Skin is warm and dry.  Psychiatric: He has a normal mood and affect.    Results for orders placed or performed in visit on 09/25/17 (from the past 24 hour(s))  POCT urinalysis dipstick     Status: Abnormal   Collection Time: 09/25/17 12:08 PM  Result Value Ref Range   Color, UA yellow    Clarity, UA cloudy    Glucose, UA negative    Bilirubin, UA negative    Ketones, UA negative    Spec Grav, UA >=1.030 (A) 1.010 - 1.025   Blood, UA large    pH, UA 5.5 5.0 - 8.0   Protein, UA 30    Urobilinogen, UA 0.2 0.2 or 1.0 E.U./dL   Nitrite, UA negatiive    Leukocytes, UA Negative Negative   Appearance     Odor    POCT Microscopic Urinalysis ([facility])     Status: Abnormal   Collection Time: 09/25/17 12:15 PM  Result Value Ref Range  WBC,UR,HPF,POC None None WBC/hpf   RBC,UR,HPF,POC Too numerous to count  (A) None RBC/hpf   Bacteria Few (A) None, Too numerous to count   Mucus Absent Absent    Epithelial Cells, UR Per Microscopy None None, Too numerous to count cells/hpf   Dg Abd 1 View  Result Date: 09/25/2017 CLINICAL DATA:  Right flank pain and hematuria for 2 days. EXAM: ABDOMEN - 1 VIEW COMPARISON:  07/11/2007 lumbar spine radiographs. FINDINGS: Two abdominopelvic films. These are both overpenetrated. Lumbar spine fixation. Non-obstructive bowel gas pattern. Given over penetration, no calcific densities over the kidneys or expected course of the ureters. No bladder calculi identified. No free intraperitoneal air. Moderate amount of ascending colonic stool. IMPRESSION: No acute findings.  Mild degradation secondary to overpenetration. Electronically Signed   By: Abigail Miyamoto M.D.   On: 09/25/2017 12:26    Assessment and Plan :   Acute right flank pain - Plan: POCT urinalysis dipstick, DG Abd 1 View, POCT Microscopic Urinalysis ([facility]), Basic metabolic panel, Urine Culture, CANCELED: POCT UA - Microscopic Only  Hematuria, unspecified type - Plan: DG Abd 1 View, POCT Microscopic Urinalysis ([facility]), Basic metabolic panel, Urine Culture  History of renal stone - Plan: DG Abd 1 View, Basic metabolic panel  Irritable bowel syndrome with constipation  Patient's vitals, x-rays, physical exam are not acute in nature. I counseled that if he wanted to pursue CT stone study a good option is to go to the ER where he can potentially be managed for stone obstruction as well. In the meantime, I recommended Zofran for his nausea, Flomax for possible renal stone. Labs are pending. We do not have 0.9% NS in stock today. However, if his creatinine is elevated based off today's lab draw, will have patient rtc for IV fluids. ER and return-to-clinic precautions discussed, patient verbalized understanding.   Jaynee Eagles, PA-C [facility] [phone] 09/25/2017  12:09 PM

## 2017-09-26 ENCOUNTER — Other Ambulatory Visit: Payer: Self-pay

## 2017-09-26 ENCOUNTER — Encounter: Payer: Self-pay | Admitting: Urgent Care

## 2017-09-26 LAB — BASIC METABOLIC PANEL
BUN / CREAT RATIO: 12 (ref 9–20)
BUN: 16 mg/dL (ref 6–24)
CHLORIDE: 109 mmol/L — AB (ref 96–106)
CO2: 20 mmol/L (ref 20–29)
Calcium: 9.2 mg/dL (ref 8.7–10.2)
Creatinine, Ser: 1.37 mg/dL — ABNORMAL HIGH (ref 0.76–1.27)
GFR calc Af Amer: 69 mL/min/{1.73_m2} (ref >59)
GFR calc non Af Amer: 60 mL/min/{1.73_m2} (ref >59)
GLUCOSE: 89 mg/dL (ref 65–99)
Potassium: 3.9 mmol/L (ref 3.5–5.2)
SODIUM: 143 mmol/L (ref 134–144)

## 2017-09-26 MED ORDER — TAMSULOSIN HCL 0.4 MG PO CAPS: 0.4000 mg | ORAL_CAPSULE | Freq: Every day | ORAL | 3 refills | Status: AC

## 2017-09-26 MED ORDER — ONDANSETRON 8 MG PO TBDP: 8.0000 mg | ORAL_TABLET | Freq: Three times a day (TID) | ORAL | 0 refills | Status: AC | PRN

## 2017-09-26 NOTE — Telephone Encounter (Signed)
Medication has been resent to correct pharmacy.  

## 2017-09-28 LAB — URINE CULTURE

## 2017-10-02 ENCOUNTER — Other Ambulatory Visit: Payer: Self-pay | Admitting: Urgent Care

## 2017-10-02 MED ORDER — CIPROFLOXACIN HCL 500 MG PO TABS: 500.0000 mg | ORAL_TABLET | Freq: Two times a day (BID) | ORAL | 0 refills | Status: AC

## 2017-10-22 ENCOUNTER — Ambulatory Visit (INDEPENDENT_AMBULATORY_CARE_PROVIDER_SITE_OTHER): Payer: BLUE CROSS/BLUE SHIELD

## 2017-10-22 DIAGNOSIS — Z23 Encounter for immunization: Secondary | ICD-10-CM | POA: Diagnosis not present

## 2018-02-06 IMAGING — MR MR LUMBAR SPINE W/O CM
5 series · 46 of 48 positions shown · non-contrast
Comparison: None.

CLINICAL DATA: Stabbing pain, weakness and numbness in left leg.

EXAM:
MRI LUMBAR SPINE WITHOUT CONTRAST
TECHNIQUE: Multiplanar, multisequence MR imaging of the lumbar spine was
performed. No intravenous contrast was administered.

[Series 3: T2 · sagittal · 4.0mm · 0.88mm/px · 6 of 13 slices shown (1 of 2)]
[im 1/13]
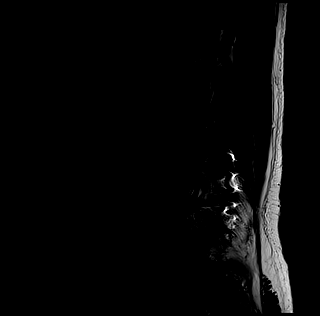
[im 3/13]
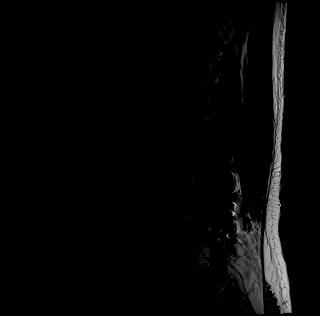
[im 5/13]
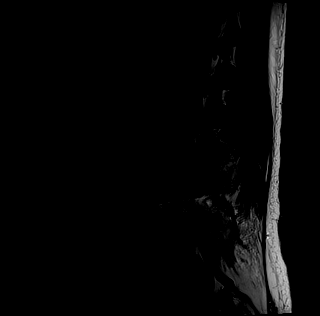
[im 8/13]
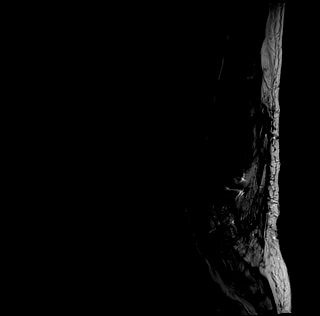
[im 10/13]
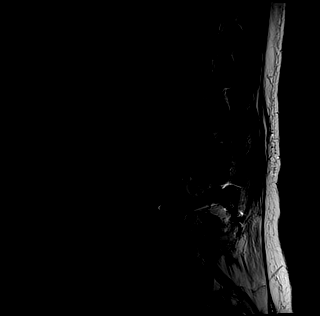
[im 13/13]
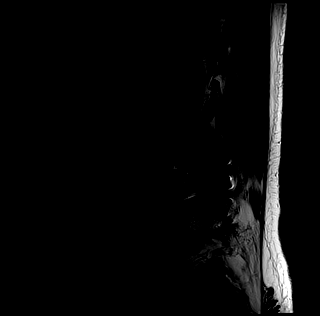

[Series 4: T1 · sagittal · 4.0mm · 0.88mm/px · 5 of 13 slices shown (1 of 2)]
[im 1/13]
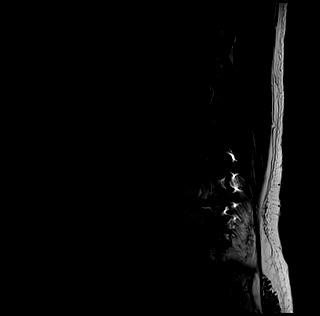
[im 4/13]
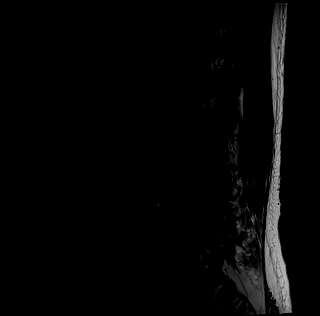
[im 7/13]
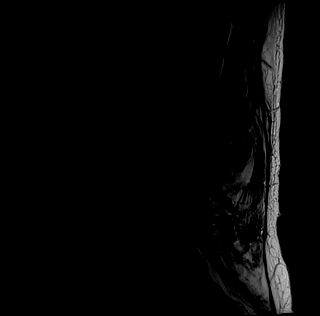
[im 10/13]
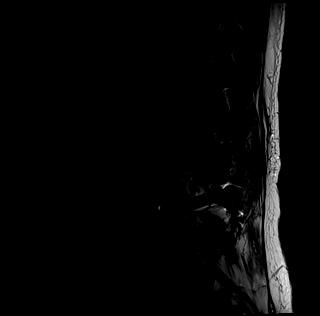
[im 13/13]
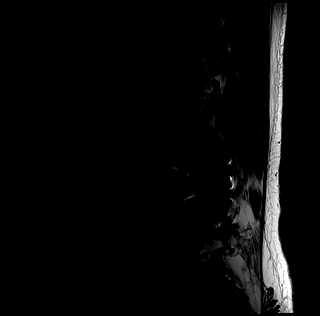

[Series 5: T2 · axial · 4.0mm · 0.78mm/px · z∈[-138,+80]mm · 16 of 42 slices shown (2 of 2)]
[im 1/42]
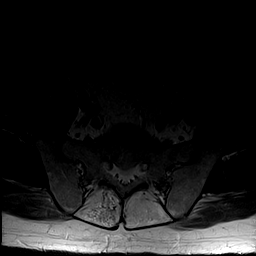
[im 3/42]
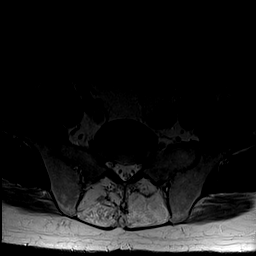
[im 6/42]
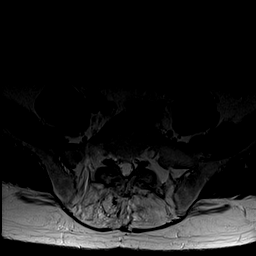
[im 9/42]
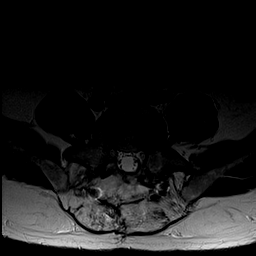
[im 11/42]
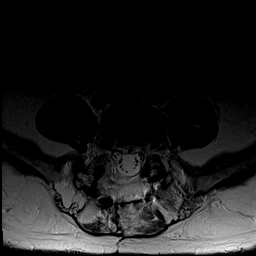
[im 14/42]
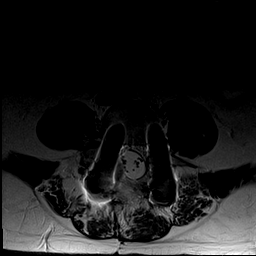
[im 17/42]
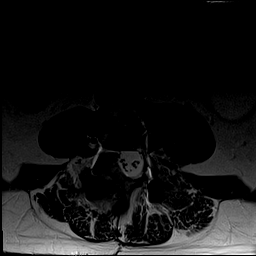
[im 20/42]
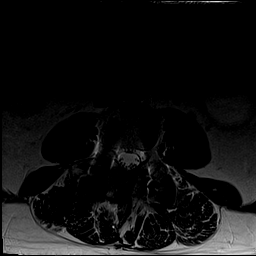
[im 22/42]
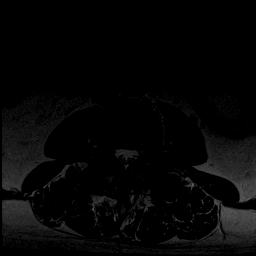
[im 25/42]
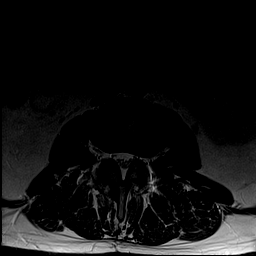
[im 28/42]
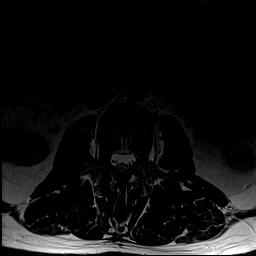
[im 31/42]
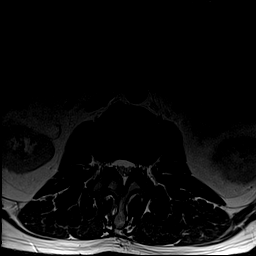
[im 33/42]
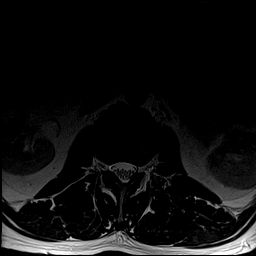
[im 36/42]
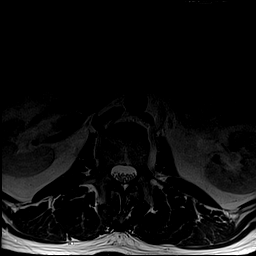
[im 39/42]
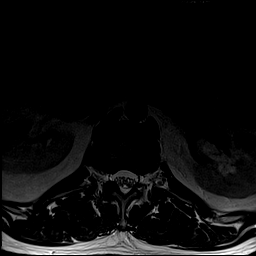
[im 42/42]
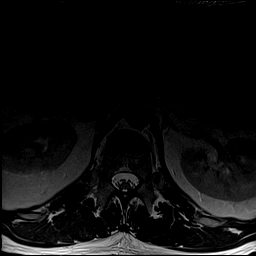

[Series 6: ir sag · sagittal · 4.0mm · 0.55mm/px · 5 of 13 slices shown]
[im 1/13]
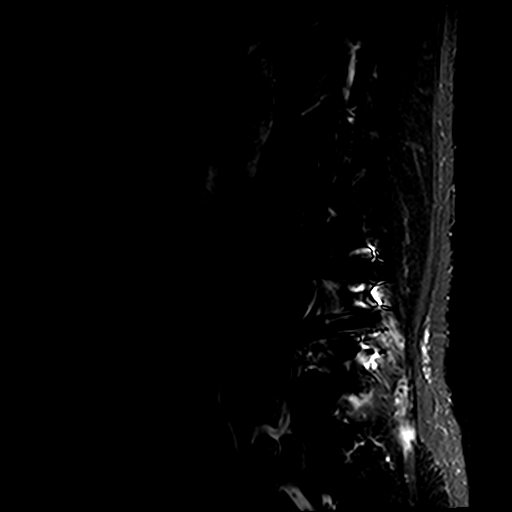
[im 4/13]
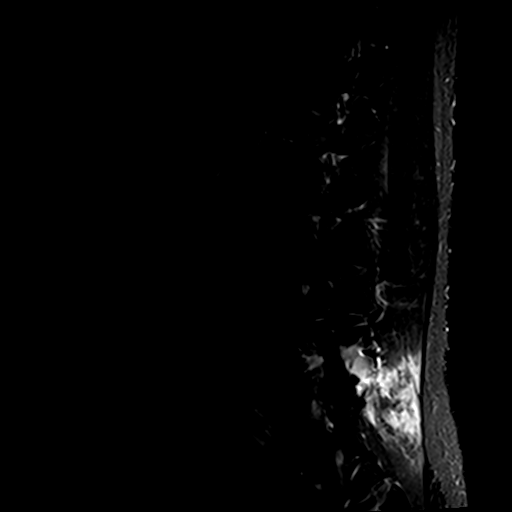
[im 7/13]
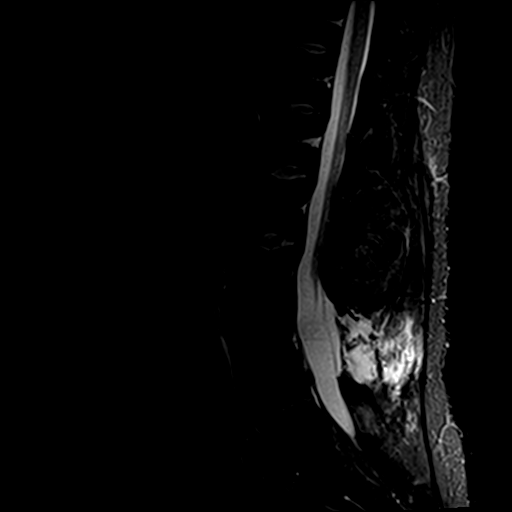
[im 10/13]
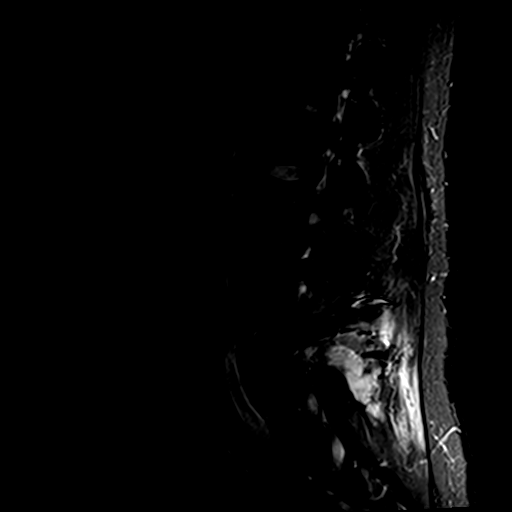
[im 13/13]
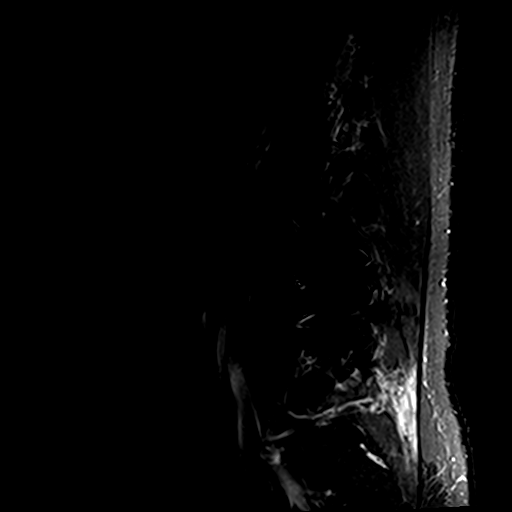

[Series 7: T1 · axial · 4.0mm · 0.78mm/px · z∈[-138,+80]mm · 14 of 42 slices shown (2 of 2)]
[im 1/42]
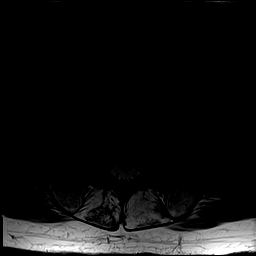
[im 3/42]
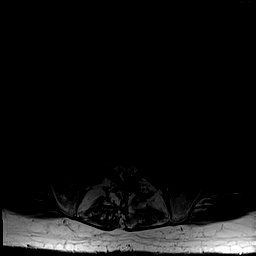
[im 6/42]
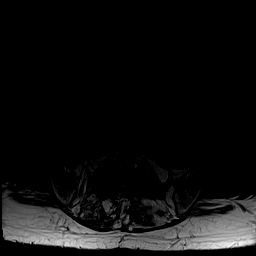
[im 9/42]
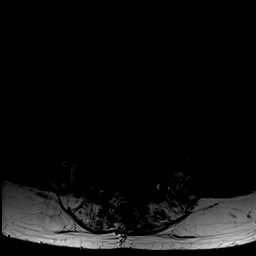
[im 11/42]
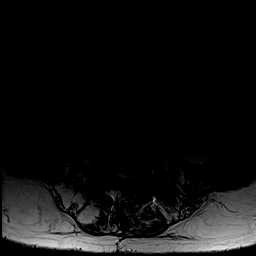
[im 14/42]
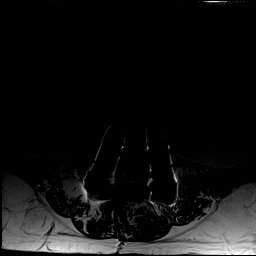
[im 17/42]
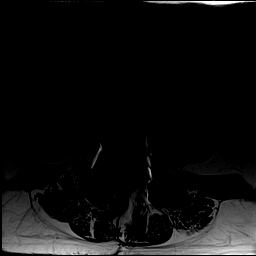
[im 20/42]
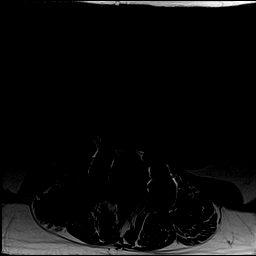
[im 22/42]
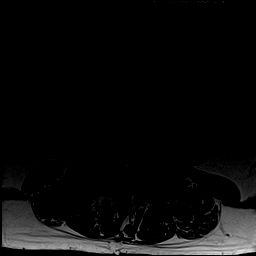
[im 25/42]
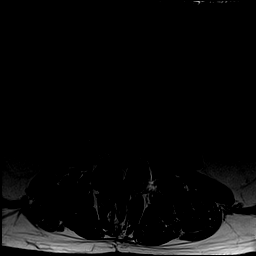
[im 28/42]
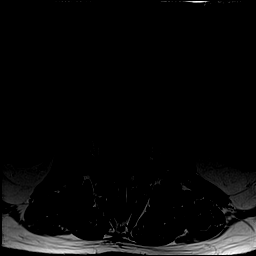
[im 31/42]
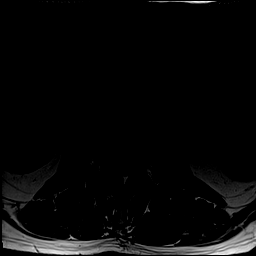
[im 36/42]
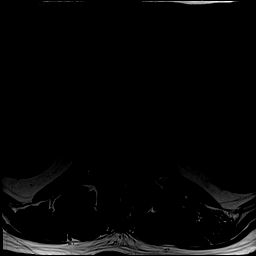
[im 42/42]
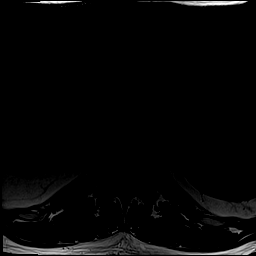

[46 of 48 positions shown; findings below may reference images not displayed]

FINDINGS: Segmentation: Normal. The lowest disc space is considered to be
L5-S1.

Alignment:  Normal

Vertebrae: There is posterior spinal fusion extending from L3-L4
with bilateral transpedicular screws and disc spacer material.

Conus medullaris: Extends to the L1 level and appears normal.

Paraspinal and other soft tissues: The visualized aorta, IVC and
iliac vessels are normal. The visualized retroperitoneal organs and
paraspinal soft tissues are normal.

Disc levels:

T12-L1: Evaluated on sagittal images only. No significant disc
herniation, spinal canal stenosis or neuroforaminal narrowing.

L1-L2: Normal disc space and facet joints. No spinal canal stenosis.
No neuroforaminal stenosis.

L2-L3: Small disc bulge and moderate facet hypertrophy. No spinal
canal stenosis. Mild left neuroforaminal stenosis.

L3-L4: Postfusion changes with posterior decompression. No spinal
canal stenosis. Mild left neuroforaminal stenosis.

L4-L5: Posterior decompression changes. No spinal canal stenosis.
Moderate right and mild-to-moderate left neuroforaminal stenosis.

L5-S1: Moderate facet hypertrophy. No spinal canal stenosis. No
neuroforaminal stenosis.
IMPRESSION: 1. Posterior spinal fusion at L3-L4 with posterior decompression at
the L3-4 and L4-5 levels.
2. No residual spinal canal stenosis.
3. L4-L5 adjacent segment disease with moderate right and
mild-to-moderate left neural foraminal stenosis.
4. Mild left neural foraminal stenosis at L2-3 and L3-4.
